# Patient Record
Sex: Female | Born: 1950 | Race: White | Hispanic: No | Marital: Married | Smoking: Never smoker
Health system: Southern US, Community
[De-identification: ages and names within clinical notes are randomized; demographics above are authoritative.]

## PROBLEM LIST (undated history)

## (undated) ENCOUNTER — Encounter

## (undated) ENCOUNTER — Encounter: Attending: Psychiatric/Mental Health | Primary: Psychiatric/Mental Health

## (undated) ENCOUNTER — Encounter: Attending: Diagnostic Radiology | Primary: Diagnostic Radiology

## (undated) ENCOUNTER — Encounter: Attending: Rheumatology | Primary: Rheumatology

## (undated) ENCOUNTER — Telehealth: Attending: Psychiatric/Mental Health | Primary: Psychiatric/Mental Health

## (undated) ENCOUNTER — Encounter: Attending: Neurology | Primary: Neurology

## (undated) ENCOUNTER — Ambulatory Visit: Payer: MEDICARE | Attending: Family Medicine | Primary: Family Medicine

## (undated) ENCOUNTER — Encounter: Attending: Internal Medicine | Primary: Internal Medicine

## (undated) ENCOUNTER — Telehealth

## (undated) ENCOUNTER — Encounter
Attending: Student in an Organized Health Care Education/Training Program | Primary: Student in an Organized Health Care Education/Training Program

## (undated) ENCOUNTER — Encounter: Attending: Ophthalmology | Primary: Ophthalmology

## (undated) ENCOUNTER — Ambulatory Visit: Payer: MEDICARE

## (undated) ENCOUNTER — Institutional Professional Consult (permissible substitution): Payer: MEDICARE

## (undated) ENCOUNTER — Ambulatory Visit: Payer: MEDICARE | Attending: Ophthalmology | Primary: Ophthalmology

## (undated) ENCOUNTER — Encounter: Attending: Family Medicine | Primary: Family Medicine

## (undated) ENCOUNTER — Ambulatory Visit

## (undated) ENCOUNTER — Encounter: Payer: Medicare (Managed Care) | Attending: Internal Medicine | Primary: Internal Medicine

## (undated) ENCOUNTER — Encounter: Attending: Hematology & Oncology | Primary: Hematology & Oncology

## (undated) ENCOUNTER — Telehealth: Attending: Neurology | Primary: Neurology

## (undated) ENCOUNTER — Ambulatory Visit: Payer: MEDICARE | Attending: Neurology | Primary: Neurology

## (undated) ENCOUNTER — Ambulatory Visit
Attending: Student in an Organized Health Care Education/Training Program | Primary: Student in an Organized Health Care Education/Training Program

## (undated) ENCOUNTER — Encounter: Attending: Cardiovascular Disease | Primary: Cardiovascular Disease

## (undated) ENCOUNTER — Telehealth: Attending: Internal Medicine | Primary: Internal Medicine

## (undated) ENCOUNTER — Ambulatory Visit: Payer: MEDICARE | Attending: Anesthesiology | Primary: Anesthesiology

## (undated) ENCOUNTER — Ambulatory Visit: Payer: MEDICARE | Attending: Hematology & Oncology | Primary: Hematology & Oncology

## (undated) ENCOUNTER — Ambulatory Visit: Payer: MEDICARE | Attending: Cardiovascular Disease | Primary: Cardiovascular Disease

## (undated) ENCOUNTER — Ambulatory Visit: Payer: MEDICARE | Attending: Rheumatology | Primary: Rheumatology

## (undated) ENCOUNTER — Ambulatory Visit: Attending: Neurology | Primary: Neurology

## (undated) ENCOUNTER — Ambulatory Visit: Payer: MEDICARE | Attending: Psychiatric/Mental Health | Primary: Psychiatric/Mental Health

## (undated) ENCOUNTER — Telehealth: Attending: Hematology & Oncology | Primary: Hematology & Oncology

## (undated) ENCOUNTER — Ambulatory Visit: Payer: MEDICARE | Attending: Allergy & Immunology | Primary: Allergy & Immunology

## (undated) ENCOUNTER — Encounter: Attending: Child & Adolescent Psychiatry | Primary: Child & Adolescent Psychiatry

## (undated) ENCOUNTER — Ambulatory Visit: Payer: Medicare (Managed Care)

## (undated) ENCOUNTER — Ambulatory Visit: Payer: MEDICARE | Attending: Geriatric Medicine | Primary: Geriatric Medicine

## (undated) ENCOUNTER — Ambulatory Visit
Payer: Medicare (Managed Care) | Attending: Student in an Organized Health Care Education/Training Program | Primary: Student in an Organized Health Care Education/Training Program

## (undated) ENCOUNTER — Ambulatory Visit: Attending: Internal Medicine | Primary: Internal Medicine

## (undated) HISTORY — PX: NISSEN FUNDOPLICATION: SHX2091

## (undated) MED ORDER — TRAMADOL 50 MG TABLET: 50 mg | tablet | Freq: Four times a day (QID) | 0 refills | 4 days

## (undated) MED ORDER — HYDROCHLOROTHIAZIDE 25 MG TABLET: ORAL | 0 days

---
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
## 2016-09-17 MED ORDER — LEFLUNOMIDE 20 MG TABLET
Freq: Every day | ORAL | 0 days
Start: 2016-09-17 — End: ?

## 2016-10-02 ENCOUNTER — Ambulatory Visit
Admission: RE | Admit: 2016-10-02 | Discharge: 2016-10-02 | Disposition: A | Discharge status: Home / Self Care | Payer: MEDICARE

## 2016-10-02 DIAGNOSIS — J301 Allergic rhinitis due to pollen: Principal | ICD-10-CM

## 2016-10-02 DIAGNOSIS — M3219 Other organ or system involvement in systemic lupus erythematosus: Principal | ICD-10-CM

## 2016-10-23 MED ORDER — LEVOCETIRIZINE 5 MG TABLET
ORAL_TABLET | 5 refills | 0 days | Status: CP
Start: 2016-10-23 — End: 2016-12-05

## 2016-11-06 ENCOUNTER — Ambulatory Visit
Admission: RE | Admit: 2016-11-06 | Discharge: 2016-11-06 | Disposition: A | Discharge status: Home / Self Care | Payer: MEDICARE

## 2016-11-06 ENCOUNTER — Ambulatory Visit
Admission: RE | Admit: 2016-11-06 | Discharge: 2016-11-06 | Disposition: A | Discharge status: Home / Self Care | Payer: MEDICARE | Attending: Family Medicine | Admitting: Family Medicine

## 2016-11-06 DIAGNOSIS — E039 Hypothyroidism, unspecified: Secondary | ICD-10-CM

## 2016-11-06 DIAGNOSIS — Z91018 Allergy to other foods: Secondary | ICD-10-CM

## 2016-11-06 DIAGNOSIS — C8310 Mantle cell lymphoma, unspecified site: Secondary | ICD-10-CM

## 2016-11-06 DIAGNOSIS — I10 Essential (primary) hypertension: Secondary | ICD-10-CM

## 2016-11-06 DIAGNOSIS — M329 Systemic lupus erythematosus, unspecified: Secondary | ICD-10-CM

## 2016-11-06 DIAGNOSIS — Z79899 Other long term (current) drug therapy: Secondary | ICD-10-CM

## 2016-11-06 DIAGNOSIS — F3342 Major depressive disorder, recurrent, in full remission: Secondary | ICD-10-CM

## 2016-11-06 DIAGNOSIS — M545 Low back pain: Secondary | ICD-10-CM

## 2016-11-06 DIAGNOSIS — E538 Deficiency of other specified B group vitamins: Secondary | ICD-10-CM

## 2016-11-06 DIAGNOSIS — E559 Vitamin D deficiency, unspecified: Secondary | ICD-10-CM

## 2016-11-06 DIAGNOSIS — M3219 Other organ or system involvement in systemic lupus erythematosus: Principal | ICD-10-CM

## 2016-11-06 DIAGNOSIS — J301 Allergic rhinitis due to pollen: Principal | ICD-10-CM

## 2016-11-06 DIAGNOSIS — H579 Unspecified disorder of eye and adnexa: Secondary | ICD-10-CM

## 2016-11-06 DIAGNOSIS — F419 Anxiety disorder, unspecified: Secondary | ICD-10-CM

## 2016-11-06 DIAGNOSIS — R7612 Nonspecific reaction to cell mediated immunity measurement of gamma interferon antigen response without active tuberculosis: Secondary | ICD-10-CM

## 2016-11-06 DIAGNOSIS — G8929 Other chronic pain: Secondary | ICD-10-CM

## 2016-11-06 MED ORDER — LISINOPRIL 5 MG TABLET
ORAL_TABLET | Freq: Every day | ORAL | 11 refills | 0.00000 days | Status: CP
Start: 2016-11-06 — End: 2016-11-18

## 2016-11-06 MED ORDER — EPINEPHRINE 0.3 MG/0.3 ML INJECTION, AUTO-INJECTOR
INTRAMUSCULAR | 3 refills | 0.00000 days | Status: CP
Start: 2016-11-06 — End: ?

## 2016-11-18 MED ORDER — LISINOPRIL 5 MG TABLET
ORAL_TABLET | Freq: Every day | ORAL | 0 refills | 0 days
Start: 2016-11-18 — End: 2017-03-27

## 2016-11-21 MED ORDER — TIZANIDINE 4 MG TABLET
ORAL_TABLET | Freq: Three times a day (TID) | ORAL | 0 refills | 0.00000 days | Status: CP | PRN
Start: 2016-11-21 — End: 2016-12-18

## 2016-11-28 MED ORDER — TRAMADOL 50 MG TABLET
ORAL_TABLET | Freq: Two times a day (BID) | ORAL | 0 refills | 0.00000 days | Status: CP | PRN
Start: 2016-11-28 — End: 2017-02-04

## 2016-12-05 ENCOUNTER — Ambulatory Visit
Admission: RE | Admit: 2016-12-05 | Discharge: 2016-12-05 | Disposition: A | Discharge status: Home / Self Care | Attending: Internal Medicine

## 2016-12-05 ENCOUNTER — Ambulatory Visit
Admission: RE | Admit: 2016-12-05 | Discharge: 2016-12-05 | Disposition: A | Discharge status: Home / Self Care | Payer: MEDICARE

## 2016-12-05 DIAGNOSIS — J301 Allergic rhinitis due to pollen: Principal | ICD-10-CM

## 2016-12-05 DIAGNOSIS — M3219 Other organ or system involvement in systemic lupus erythematosus: Principal | ICD-10-CM

## 2016-12-05 DIAGNOSIS — M797 Fibromyalgia: Secondary | ICD-10-CM

## 2016-12-05 MED ORDER — GABAPENTIN 300 MG CAPSULE
ORAL_CAPSULE | Freq: Every evening | ORAL | 3 refills | 0 days | Status: CP
Start: 2016-12-05 — End: 2017-06-25

## 2016-12-05 MED ORDER — NAPROXEN 500 MG TABLET
ORAL_TABLET | Freq: Two times a day (BID) | ORAL | 11 refills | 0.00000 days | Status: CP
Start: 2016-12-05 — End: 2017-05-29

## 2016-12-08 MED ORDER — LEVOCETIRIZINE 5 MG TABLET
ORAL_TABLET | prn refills | 0 days | Status: CP
Start: 2016-12-08 — End: 2017-04-10

## 2016-12-08 MED ORDER — FLUTICASONE PROPIONATE 50 MCG/ACTUATION NASAL SPRAY,SUSPENSION
Freq: Two times a day (BID) | NASAL | prn refills | 0.00000 days | Status: CP
Start: 2016-12-08 — End: 2017-06-06

## 2016-12-13 ENCOUNTER — Ambulatory Visit
Admission: RE | Admit: 2016-12-13 | Discharge: 2016-12-13 | Disposition: A | Discharge status: Home / Self Care | Payer: MEDICARE | Attending: Hematology & Oncology | Admitting: Hematology & Oncology

## 2016-12-13 DIAGNOSIS — C8310 Mantle cell lymphoma, unspecified site: Principal | ICD-10-CM

## 2016-12-16 ENCOUNTER — Ambulatory Visit
Admission: RE | Admit: 2016-12-16 | Discharge: 2016-12-16 | Disposition: A | Discharge status: Home / Self Care | Payer: MEDICARE

## 2016-12-16 ENCOUNTER — Ambulatory Visit
Admission: RE | Admit: 2016-12-16 | Discharge: 2016-12-16 | Disposition: A | Discharge status: Home / Self Care | Payer: MEDICARE | Attending: Physical Medicine & Rehabilitation | Admitting: Physical Medicine & Rehabilitation

## 2016-12-16 DIAGNOSIS — M47816 Spondylosis without myelopathy or radiculopathy, lumbar region: Secondary | ICD-10-CM

## 2016-12-16 DIAGNOSIS — M549 Dorsalgia, unspecified: Principal | ICD-10-CM

## 2016-12-17 ENCOUNTER — Ambulatory Visit: Admission: RE | Admit: 2016-12-17 | Discharge: 2016-12-17 | Admitting: Ophthalmology

## 2016-12-17 DIAGNOSIS — T378X5A Adverse effect of other specified systemic anti-infectives and antiparasitics, initial encounter: Secondary | ICD-10-CM

## 2016-12-17 DIAGNOSIS — M329 Systemic lupus erythematosus, unspecified: Principal | ICD-10-CM

## 2016-12-18 MED ORDER — TIZANIDINE 4 MG TABLET
ORAL_TABLET | Freq: Two times a day (BID) | ORAL | 1 refills | 0.00000 days | Status: CP | PRN
Start: 2016-12-18 — End: 2017-02-17

## 2016-12-23 MED ORDER — CLONAZEPAM 0.5 MG TABLET
ORAL_TABLET | 0 refills | 0 days | Status: CP
Start: 2016-12-23 — End: 2016-12-25

## 2016-12-25 MED ORDER — CLONAZEPAM 0.5 MG TABLET
ORAL_TABLET | 0 refills | 0 days | Status: CP
Start: 2016-12-25 — End: 2017-01-06

## 2017-01-01 ENCOUNTER — Ambulatory Visit
Admission: RE | Admit: 2017-01-01 | Discharge: 2017-01-01 | Disposition: A | Discharge status: Home / Self Care | Payer: MEDICARE

## 2017-01-01 ENCOUNTER — Ambulatory Visit
Admission: RE | Admit: 2017-01-01 | Discharge: 2017-01-01 | Disposition: A | Discharge status: Home / Self Care | Payer: MEDICARE | Attending: Family Medicine | Admitting: Family Medicine

## 2017-01-01 DIAGNOSIS — L989 Disorder of the skin and subcutaneous tissue, unspecified: Secondary | ICD-10-CM

## 2017-01-01 DIAGNOSIS — M79622 Pain in left upper arm: Secondary | ICD-10-CM

## 2017-01-01 DIAGNOSIS — R7612 Nonspecific reaction to cell mediated immunity measurement of gamma interferon antigen response without active tuberculosis: Secondary | ICD-10-CM

## 2017-01-01 DIAGNOSIS — Z23 Encounter for immunization: Secondary | ICD-10-CM

## 2017-01-01 DIAGNOSIS — M3219 Other organ or system involvement in systemic lupus erythematosus: Principal | ICD-10-CM

## 2017-01-01 DIAGNOSIS — J301 Allergic rhinitis due to pollen: Principal | ICD-10-CM

## 2017-01-01 DIAGNOSIS — F419 Anxiety disorder, unspecified: Principal | ICD-10-CM

## 2017-01-01 DIAGNOSIS — Z79899 Other long term (current) drug therapy: Secondary | ICD-10-CM

## 2017-01-01 DIAGNOSIS — G44209 Tension-type headache, unspecified, not intractable: Secondary | ICD-10-CM

## 2017-01-01 DIAGNOSIS — I10 Essential (primary) hypertension: Secondary | ICD-10-CM

## 2017-01-01 DIAGNOSIS — M79602 Pain in left arm: Secondary | ICD-10-CM

## 2017-01-01 DIAGNOSIS — J349 Unspecified disorder of nose and nasal sinuses: Secondary | ICD-10-CM

## 2017-01-06 MED ORDER — CLONAZEPAM 1 MG TABLET
ORAL_TABLET | Freq: Every evening | ORAL | 0 refills | 0 days | Status: CP
Start: 2017-01-06 — End: 2017-04-30

## 2017-01-08 ENCOUNTER — Ambulatory Visit
Admission: RE | Admit: 2017-01-08 | Discharge: 2017-01-08 | Payer: MEDICARE | Attending: Ophthalmology | Admitting: Ophthalmology

## 2017-01-08 DIAGNOSIS — T378X5A Adverse effect of other specified systemic anti-infectives and antiparasitics, initial encounter: Secondary | ICD-10-CM

## 2017-01-08 DIAGNOSIS — M329 Systemic lupus erythematosus, unspecified: Principal | ICD-10-CM

## 2017-01-08 DIAGNOSIS — H04123 Dry eye syndrome of bilateral lacrimal glands: Secondary | ICD-10-CM

## 2017-01-08 MED ORDER — PREDNISOLONE SODIUM PHOSPHATE 1 % EYE DROPS
0 refills | 0 days | Status: CP
Start: 2017-01-08 — End: 2017-03-31

## 2017-01-21 MED ORDER — LEVOFLOXACIN 750 MG TABLET
ORAL_TABLET | Freq: Every day | ORAL | 0 refills | 0 days | Status: CP
Start: 2017-01-21 — End: 2017-01-31

## 2017-01-29 ENCOUNTER — Ambulatory Visit
Admission: RE | Admit: 2017-01-29 | Discharge: 2017-01-29 | Disposition: A | Discharge status: Home / Self Care | Payer: MEDICARE

## 2017-01-29 DIAGNOSIS — J3089 Other allergic rhinitis: Principal | ICD-10-CM

## 2017-01-29 DIAGNOSIS — M3219 Other organ or system involvement in systemic lupus erythematosus: Principal | ICD-10-CM

## 2017-02-04 MED ORDER — TRAMADOL 50 MG TABLET
ORAL_TABLET | Freq: Two times a day (BID) | ORAL | 0 refills | 0 days | Status: CP | PRN
Start: 2017-02-04 — End: 2017-04-01

## 2017-02-10 ENCOUNTER — Ambulatory Visit
Admission: RE | Admit: 2017-02-10 | Discharge: 2017-02-10 | Payer: MEDICARE | Attending: Ophthalmology | Admitting: Ophthalmology

## 2017-02-10 DIAGNOSIS — H2513 Age-related nuclear cataract, bilateral: Secondary | ICD-10-CM

## 2017-02-10 DIAGNOSIS — H04123 Dry eye syndrome of bilateral lacrimal glands: Principal | ICD-10-CM

## 2017-02-18 MED ORDER — TIZANIDINE 4 MG TABLET
ORAL_TABLET | Freq: Every evening | ORAL | 0 refills | 0.00000 days | Status: CP | PRN
Start: 2017-02-18 — End: 2017-03-24

## 2017-02-26 ENCOUNTER — Ambulatory Visit: Admission: RE | Admit: 2017-02-26 | Discharge: 2017-02-26 | Payer: MEDICARE

## 2017-02-26 ENCOUNTER — Ambulatory Visit
Admission: RE | Admit: 2017-02-26 | Discharge: 2017-02-26 | Disposition: A | Discharge status: Home / Self Care | Payer: MEDICARE

## 2017-02-26 DIAGNOSIS — J3089 Other allergic rhinitis: Principal | ICD-10-CM

## 2017-02-26 DIAGNOSIS — M3219 Other organ or system involvement in systemic lupus erythematosus: Principal | ICD-10-CM

## 2017-03-24 MED ORDER — TIZANIDINE 2 MG TABLET
ORAL_TABLET | Freq: Every evening | ORAL | 0 refills | 0.00000 days | Status: CP | PRN
Start: 2017-03-24 — End: 2017-04-29

## 2017-03-27 ENCOUNTER — Ambulatory Visit: Admit: 2017-03-27 | Discharge: 2017-03-27 | Payer: MEDICARE | Attending: Family Medicine | Primary: Family Medicine

## 2017-03-27 ENCOUNTER — Ambulatory Visit: Admit: 2017-03-27 | Discharge: 2017-03-27 | Payer: MEDICARE

## 2017-03-27 ENCOUNTER — Institutional Professional Consult (permissible substitution): Admit: 2017-03-27 | Discharge: 2017-03-27 | Payer: MEDICARE

## 2017-03-27 DIAGNOSIS — M3219 Other organ or system involvement in systemic lupus erythematosus: Principal | ICD-10-CM

## 2017-03-27 DIAGNOSIS — C8580 Other specified types of non-Hodgkin lymphoma, unspecified site: Secondary | ICD-10-CM

## 2017-03-27 DIAGNOSIS — I10 Essential (primary) hypertension: Secondary | ICD-10-CM

## 2017-03-27 DIAGNOSIS — J3089 Other allergic rhinitis: Principal | ICD-10-CM

## 2017-03-27 DIAGNOSIS — E039 Hypothyroidism, unspecified: Secondary | ICD-10-CM

## 2017-03-27 DIAGNOSIS — E559 Vitamin D deficiency, unspecified: Secondary | ICD-10-CM

## 2017-03-27 DIAGNOSIS — G8929 Other chronic pain: Secondary | ICD-10-CM

## 2017-03-27 DIAGNOSIS — Z79899 Other long term (current) drug therapy: Secondary | ICD-10-CM

## 2017-03-27 DIAGNOSIS — Z8659 Personal history of other mental and behavioral disorders: Secondary | ICD-10-CM

## 2017-03-27 DIAGNOSIS — F419 Anxiety disorder, unspecified: Secondary | ICD-10-CM

## 2017-03-27 DIAGNOSIS — F329 Major depressive disorder, single episode, unspecified: Secondary | ICD-10-CM

## 2017-03-27 DIAGNOSIS — R51 Headache: Secondary | ICD-10-CM

## 2017-03-31 MED ORDER — PREDNISOLONE SODIUM PHOSPHATE 1 % EYE DROPS
Freq: Every day | OPHTHALMIC | 0 refills | 0 days | Status: CP
Start: 2017-03-31 — End: 2017-08-05

## 2017-04-01 MED ORDER — TRAZODONE 50 MG TABLET
ORAL_TABLET | Freq: Every evening | ORAL | 0 refills | 0 days | Status: CP
Start: 2017-04-01 — End: 2017-04-30

## 2017-04-01 MED ORDER — TRAMADOL 50 MG TABLET
ORAL_TABLET | Freq: Every day | ORAL | 0 refills | 0 days | Status: CP | PRN
Start: 2017-04-01 — End: 2017-05-28

## 2017-04-07 ENCOUNTER — Ambulatory Visit: Admit: 2017-04-07 | Discharge: 2017-04-08 | Payer: MEDICARE | Attending: Ophthalmology | Primary: Ophthalmology

## 2017-04-07 DIAGNOSIS — H2513 Age-related nuclear cataract, bilateral: Secondary | ICD-10-CM

## 2017-04-07 DIAGNOSIS — H04123 Dry eye syndrome of bilateral lacrimal glands: Principal | ICD-10-CM

## 2017-04-07 DIAGNOSIS — M329 Systemic lupus erythematosus, unspecified: Secondary | ICD-10-CM

## 2017-04-10 MED ORDER — LEVOCETIRIZINE 5 MG TABLET
ORAL_TABLET | 6 refills | 0 days | Status: CP
Start: 2017-04-10 — End: 2018-01-22

## 2017-04-24 ENCOUNTER — Ambulatory Visit: Admit: 2017-04-24 | Discharge: 2017-04-25 | Payer: MEDICARE

## 2017-04-24 ENCOUNTER — Institutional Professional Consult (permissible substitution): Admit: 2017-04-24 | Discharge: 2017-04-25 | Payer: MEDICARE

## 2017-04-24 DIAGNOSIS — M3219 Other organ or system involvement in systemic lupus erythematosus: Principal | ICD-10-CM

## 2017-04-24 DIAGNOSIS — J3089 Other allergic rhinitis: Principal | ICD-10-CM

## 2017-04-28 MED ORDER — LEVOTHYROXINE 100 MCG TABLET
ORAL_TABLET | 1 refills | 0 days | Status: CP
Start: 2017-04-28 — End: 2017-05-29

## 2017-04-29 ENCOUNTER — Ambulatory Visit: Admit: 2017-04-29 | Discharge: 2017-04-30 | Payer: MEDICARE | Attending: Neurology | Primary: Neurology

## 2017-04-29 DIAGNOSIS — R51 Headache: Secondary | ICD-10-CM

## 2017-04-29 DIAGNOSIS — G43709 Chronic migraine without aura, not intractable, without status migrainosus: Principal | ICD-10-CM

## 2017-04-29 DIAGNOSIS — G4440 Drug-induced headache, not elsewhere classified, not intractable: Secondary | ICD-10-CM

## 2017-04-29 DIAGNOSIS — N6082 Other benign mammary dysplasias of left breast: Secondary | ICD-10-CM

## 2017-04-29 DIAGNOSIS — Z1231 Encounter for screening mammogram for malignant neoplasm of breast: Secondary | ICD-10-CM

## 2017-04-29 MED ORDER — RIZATRIPTAN 10 MG TABLET
ORAL_TABLET | Freq: Once | ORAL | 2 refills | 0.00000 days | Status: CP | PRN
Start: 2017-04-29 — End: 2017-04-29

## 2017-04-29 MED ORDER — RIZATRIPTAN 10 MG TABLET: 10 mg | tablet | Freq: Once | 2 refills | 0 days | Status: AC

## 2017-04-30 ENCOUNTER — Ambulatory Visit
Admit: 2017-04-30 | Discharge: 2017-05-01 | Payer: MEDICARE | Attending: Psychiatric/Mental Health | Primary: Psychiatric/Mental Health

## 2017-04-30 DIAGNOSIS — F419 Anxiety disorder, unspecified: Secondary | ICD-10-CM

## 2017-04-30 DIAGNOSIS — Z8659 Personal history of other mental and behavioral disorders: Secondary | ICD-10-CM

## 2017-04-30 DIAGNOSIS — Z79899 Other long term (current) drug therapy: Secondary | ICD-10-CM

## 2017-04-30 DIAGNOSIS — G8929 Other chronic pain: Secondary | ICD-10-CM

## 2017-04-30 DIAGNOSIS — F329 Major depressive disorder, single episode, unspecified: Principal | ICD-10-CM

## 2017-04-30 MED ORDER — TIZANIDINE 2 MG TABLET
ORAL_TABLET | Freq: Every evening | ORAL | 0 refills | 0.00000 days | Status: CP | PRN
Start: 2017-04-30 — End: 2017-07-14

## 2017-04-30 MED ORDER — CLONAZEPAM 1 MG TABLET
ORAL_TABLET | 1 refills | 0 days | Status: CP
Start: 2017-04-30 — End: 2017-06-25

## 2017-04-30 MED ORDER — DULOXETINE 60 MG CAPSULE,DELAYED RELEASE
ORAL_CAPSULE | Freq: Every day | ORAL | 0 refills | 0.00000 days | Status: CP
Start: 2017-04-30 — End: 2017-06-25

## 2017-04-30 MED ORDER — TRAZODONE 50 MG TABLET
ORAL_TABLET | Freq: Every evening | ORAL | 0 refills | 0.00000 days | Status: CP
Start: 2017-04-30 — End: 2017-06-25

## 2017-05-07 ENCOUNTER — Ambulatory Visit: Admit: 2017-05-07 | Discharge: 2017-05-07 | Payer: MEDICARE

## 2017-05-07 DIAGNOSIS — G43709 Chronic migraine without aura, not intractable, without status migrainosus: Principal | ICD-10-CM

## 2017-05-07 DIAGNOSIS — G4440 Drug-induced headache, not elsewhere classified, not intractable: Secondary | ICD-10-CM

## 2017-05-07 DIAGNOSIS — R51 Headache: Secondary | ICD-10-CM

## 2017-05-20 ENCOUNTER — Ambulatory Visit: Admit: 2017-05-20 | Discharge: 2017-05-21 | Payer: MEDICARE

## 2017-05-20 DIAGNOSIS — M3219 Other organ or system involvement in systemic lupus erythematosus: Principal | ICD-10-CM

## 2017-05-20 DIAGNOSIS — C8580 Other specified types of non-Hodgkin lymphoma, unspecified site: Secondary | ICD-10-CM

## 2017-05-20 DIAGNOSIS — G43709 Chronic migraine without aura, not intractable, without status migrainosus: Secondary | ICD-10-CM

## 2017-05-20 DIAGNOSIS — G4440 Drug-induced headache, not elsewhere classified, not intractable: Secondary | ICD-10-CM

## 2017-05-20 DIAGNOSIS — R51 Headache: Secondary | ICD-10-CM

## 2017-05-27 ENCOUNTER — Ambulatory Visit: Admit: 2017-05-27 | Discharge: 2017-05-27 | Payer: MEDICARE | Attending: Neurology | Primary: Neurology

## 2017-05-27 ENCOUNTER — Institutional Professional Consult (permissible substitution): Admit: 2017-05-27 | Discharge: 2017-05-27 | Payer: MEDICARE

## 2017-05-27 DIAGNOSIS — G43709 Chronic migraine without aura, not intractable, without status migrainosus: Principal | ICD-10-CM

## 2017-05-27 DIAGNOSIS — J3089 Other allergic rhinitis: Principal | ICD-10-CM

## 2017-05-27 MED ORDER — SUMATRIPTAN 100 MG TABLET
ORAL_TABLET | Freq: Once | ORAL | 2 refills | 0.00000 days | Status: CP | PRN
Start: 2017-05-27 — End: 2017-05-31

## 2017-05-28 ENCOUNTER — Ambulatory Visit: Admit: 2017-05-28 | Discharge: 2017-05-28 | Payer: MEDICARE

## 2017-05-28 DIAGNOSIS — Z1231 Encounter for screening mammogram for malignant neoplasm of breast: Principal | ICD-10-CM

## 2017-05-28 DIAGNOSIS — N6082 Other benign mammary dysplasias of left breast: Secondary | ICD-10-CM

## 2017-05-28 MED ORDER — TRAMADOL 50 MG TABLET
ORAL_TABLET | Freq: Every day | ORAL | 0 refills | 0.00000 days | Status: CP | PRN
Start: 2017-05-28 — End: 2017-07-22

## 2017-05-29 ENCOUNTER — Ambulatory Visit: Admit: 2017-05-29 | Discharge: 2017-05-30 | Payer: MEDICARE | Attending: Family Medicine | Primary: Family Medicine

## 2017-05-29 ENCOUNTER — Ambulatory Visit: Admit: 2017-05-29 | Discharge: 2017-05-30 | Payer: MEDICARE

## 2017-05-29 ENCOUNTER — Ambulatory Visit
Admit: 2017-05-29 | Discharge: 2017-05-30 | Payer: MEDICARE | Attending: Physical Medicine & Rehabilitation | Primary: Physical Medicine & Rehabilitation

## 2017-05-29 DIAGNOSIS — E039 Hypothyroidism, unspecified: Secondary | ICD-10-CM

## 2017-05-29 DIAGNOSIS — M79602 Pain in left arm: Secondary | ICD-10-CM

## 2017-05-29 DIAGNOSIS — Z79899 Other long term (current) drug therapy: Secondary | ICD-10-CM

## 2017-05-29 DIAGNOSIS — N644 Mastodynia: Secondary | ICD-10-CM

## 2017-05-29 DIAGNOSIS — Z9289 Personal history of other medical treatment: Principal | ICD-10-CM

## 2017-05-29 DIAGNOSIS — M3219 Other organ or system involvement in systemic lupus erythematosus: Principal | ICD-10-CM

## 2017-05-29 DIAGNOSIS — I10 Essential (primary) hypertension: Secondary | ICD-10-CM

## 2017-05-29 DIAGNOSIS — M797 Fibromyalgia: Secondary | ICD-10-CM

## 2017-05-29 DIAGNOSIS — M47816 Spondylosis without myelopathy or radiculopathy, lumbar region: Principal | ICD-10-CM

## 2017-05-29 MED ORDER — NAPROXEN 500 MG TABLET
ORAL_TABLET | Freq: Every day | ORAL | 11 refills | 0 days
Start: 2017-05-29 — End: 2017-08-21

## 2017-05-29 MED ORDER — HYDROCHLOROTHIAZIDE 25 MG TABLET
ORAL_TABLET | Freq: Every day | ORAL | 3 refills | 0 days | Status: CP
Start: 2017-05-29 — End: 2017-12-10

## 2017-05-29 MED ORDER — LEVOTHYROXINE 50 MCG TABLET
ORAL_TABLET | Freq: Every day | ORAL | 1 refills | 0 days | Status: CP
Start: 2017-05-29 — End: 2018-01-21

## 2017-05-30 ENCOUNTER — Ambulatory Visit: Admit: 2017-05-30 | Discharge: 2017-05-31 | Payer: MEDICARE | Attending: Neurology | Primary: Neurology

## 2017-05-30 DIAGNOSIS — G43709 Chronic migraine without aura, not intractable, without status migrainosus: Principal | ICD-10-CM

## 2017-06-04 ENCOUNTER — Ambulatory Visit: Admit: 2017-06-04 | Discharge: 2017-06-05 | Payer: MEDICARE

## 2017-06-04 DIAGNOSIS — L820 Inflamed seborrheic keratosis: Secondary | ICD-10-CM

## 2017-06-04 DIAGNOSIS — D229 Melanocytic nevi, unspecified: Secondary | ICD-10-CM

## 2017-06-04 DIAGNOSIS — L821 Other seborrheic keratosis: Secondary | ICD-10-CM

## 2017-06-04 DIAGNOSIS — I781 Nevus, non-neoplastic: Secondary | ICD-10-CM

## 2017-06-04 DIAGNOSIS — L570 Actinic keratosis: Principal | ICD-10-CM

## 2017-06-04 DIAGNOSIS — L851 Acquired keratosis [keratoderma] palmaris et plantaris: Secondary | ICD-10-CM

## 2017-06-04 MED ORDER — AMMONIUM LACTATE 12 % TOPICAL CREAM
Freq: Every day | TOPICAL | 6 refills | 0 days | Status: CP
Start: 2017-06-04 — End: ?

## 2017-06-06 MED ORDER — FLUTICASONE PROPIONATE 50 MCG/ACTUATION NASAL SPRAY,SUSPENSION
Freq: Two times a day (BID) | NASAL | prn refills | 0.00000 days | Status: CP
Start: 2017-06-06 — End: 2018-04-24

## 2017-06-08 MED ORDER — LEVOFLOXACIN 750 MG TABLET
ORAL_TABLET | Freq: Every day | ORAL | 0 refills | 0.00000 days | Status: CP
Start: 2017-06-08 — End: 2017-06-18

## 2017-06-11 ENCOUNTER — Ambulatory Visit: Admit: 2017-06-11 | Discharge: 2017-06-12 | Payer: MEDICARE

## 2017-06-11 DIAGNOSIS — H04123 Dry eye syndrome of bilateral lacrimal glands: Secondary | ICD-10-CM

## 2017-06-11 DIAGNOSIS — T451X4D Poisoning by antineoplastic and immunosuppressive drugs, undetermined, subsequent encounter: Principal | ICD-10-CM

## 2017-06-11 DIAGNOSIS — H2513 Age-related nuclear cataract, bilateral: Secondary | ICD-10-CM

## 2017-06-11 DIAGNOSIS — T378X5A Adverse effect of other specified systemic anti-infectives and antiparasitics, initial encounter: Secondary | ICD-10-CM

## 2017-06-11 DIAGNOSIS — H53423 Scotoma of blind spot area, bilateral: Secondary | ICD-10-CM

## 2017-06-17 ENCOUNTER — Ambulatory Visit: Admit: 2017-06-17 | Discharge: 2017-06-17 | Payer: MEDICARE

## 2017-06-17 ENCOUNTER — Institutional Professional Consult (permissible substitution): Admit: 2017-06-17 | Discharge: 2017-06-17 | Payer: MEDICARE

## 2017-06-17 DIAGNOSIS — M3219 Other organ or system involvement in systemic lupus erythematosus: Principal | ICD-10-CM

## 2017-06-17 DIAGNOSIS — J3089 Other allergic rhinitis: Principal | ICD-10-CM

## 2017-06-23 MED ORDER — OMEPRAZOLE 40 MG CAPSULE,DELAYED RELEASE
ORAL_CAPSULE | 1 refills | 0 days | Status: CP
Start: 2017-06-23 — End: 2017-11-30

## 2017-06-24 ENCOUNTER — Ambulatory Visit: Admit: 2017-06-24 | Discharge: 2017-06-25 | Payer: MEDICARE | Attending: Neurology | Primary: Neurology

## 2017-06-24 DIAGNOSIS — H02403 Unspecified ptosis of bilateral eyelids: Principal | ICD-10-CM

## 2017-06-24 DIAGNOSIS — F3342 Major depressive disorder, recurrent, in full remission: Secondary | ICD-10-CM

## 2017-06-24 DIAGNOSIS — G43709 Chronic migraine without aura, not intractable, without status migrainosus: Secondary | ICD-10-CM

## 2017-06-25 ENCOUNTER — Ambulatory Visit: Admit: 2017-06-25 | Discharge: 2017-06-26 | Payer: MEDICARE

## 2017-06-25 ENCOUNTER — Ambulatory Visit
Admit: 2017-06-25 | Discharge: 2017-06-26 | Payer: MEDICARE | Attending: Psychiatric/Mental Health | Primary: Psychiatric/Mental Health

## 2017-06-25 DIAGNOSIS — Z79899 Other long term (current) drug therapy: Secondary | ICD-10-CM

## 2017-06-25 DIAGNOSIS — F3341 Major depressive disorder, recurrent, in partial remission: Secondary | ICD-10-CM

## 2017-06-25 DIAGNOSIS — F4323 Adjustment disorder with mixed anxiety and depressed mood: Principal | ICD-10-CM

## 2017-06-25 DIAGNOSIS — R21 Rash and other nonspecific skin eruption: Secondary | ICD-10-CM

## 2017-06-25 DIAGNOSIS — Z8659 Personal history of other mental and behavioral disorders: Secondary | ICD-10-CM

## 2017-06-25 DIAGNOSIS — G43709 Chronic migraine without aura, not intractable, without status migrainosus: Secondary | ICD-10-CM

## 2017-06-25 DIAGNOSIS — F3342 Major depressive disorder, recurrent, in full remission: Secondary | ICD-10-CM

## 2017-06-25 DIAGNOSIS — F411 Generalized anxiety disorder: Secondary | ICD-10-CM

## 2017-06-25 DIAGNOSIS — M797 Fibromyalgia: Principal | ICD-10-CM

## 2017-06-25 MED ORDER — GABAPENTIN 300 MG CAPSULE
ORAL_CAPSULE | Freq: Two times a day (BID) | ORAL | 3 refills | 0 days | Status: CP
Start: 2017-06-25 — End: 2017-08-21

## 2017-06-25 MED ORDER — DULOXETINE 60 MG CAPSULE,DELAYED RELEASE
ORAL_CAPSULE | Freq: Every day | ORAL | 0 refills | 0 days | Status: CP
Start: 2017-06-25 — End: 2017-09-03

## 2017-06-25 MED ORDER — TRAZODONE 50 MG TABLET
ORAL_TABLET | Freq: Every evening | ORAL | 1 refills | 0.00000 days | Status: CP
Start: 2017-06-25 — End: 2017-12-10

## 2017-06-25 MED ORDER — CLONAZEPAM 1 MG TABLET
ORAL_TABLET | 1 refills | 0 days | Status: CP
Start: 2017-06-25 — End: 2017-08-22

## 2017-06-28 MED ORDER — LIDOCAINE 5 % TOPICAL PATCH
MEDICATED_PATCH | Freq: Every day | TRANSDERMAL | 5 refills | 0.00000 days | Status: CP | PRN
Start: 2017-06-28 — End: 2018-06-28

## 2017-07-10 ENCOUNTER — Ambulatory Visit: Admit: 2017-07-10 | Discharge: 2017-07-11 | Payer: MEDICARE | Attending: Ophthalmology | Primary: Ophthalmology

## 2017-07-10 DIAGNOSIS — H04123 Dry eye syndrome of bilateral lacrimal glands: Principal | ICD-10-CM

## 2017-07-14 MED ORDER — TIZANIDINE 2 MG TABLET
ORAL_TABLET | Freq: Every evening | ORAL | 0 refills | 0.00000 days | Status: CP | PRN
Start: 2017-07-14 — End: 2017-08-21

## 2017-07-15 ENCOUNTER — Institutional Professional Consult (permissible substitution): Admit: 2017-07-15 | Discharge: 2017-07-16 | Payer: MEDICARE

## 2017-07-15 ENCOUNTER — Ambulatory Visit: Admit: 2017-07-15 | Discharge: 2017-07-16 | Payer: MEDICARE

## 2017-07-15 DIAGNOSIS — M3219 Other organ or system involvement in systemic lupus erythematosus: Principal | ICD-10-CM

## 2017-07-15 DIAGNOSIS — E039 Hypothyroidism, unspecified: Secondary | ICD-10-CM

## 2017-07-15 DIAGNOSIS — J3089 Other allergic rhinitis: Principal | ICD-10-CM

## 2017-07-15 DIAGNOSIS — I10 Essential (primary) hypertension: Secondary | ICD-10-CM

## 2017-07-22 MED ORDER — TRAMADOL 50 MG TABLET
ORAL_TABLET | Freq: Every day | ORAL | 0 refills | 0 days | Status: CP | PRN
Start: 2017-07-22 — End: 2017-08-21

## 2017-07-24 ENCOUNTER — Ambulatory Visit: Admit: 2017-07-24 | Discharge: 2017-07-25 | Payer: MEDICARE | Attending: Clinical | Primary: Clinical

## 2017-07-24 DIAGNOSIS — F4323 Adjustment disorder with mixed anxiety and depressed mood: Principal | ICD-10-CM

## 2017-07-30 ENCOUNTER — Ambulatory Visit: Admit: 2017-07-30 | Discharge: 2017-07-31 | Payer: MEDICARE | Attending: Ophthalmology | Primary: Ophthalmology

## 2017-07-30 DIAGNOSIS — H04123 Dry eye syndrome of bilateral lacrimal glands: Principal | ICD-10-CM

## 2017-08-04 ENCOUNTER — Ambulatory Visit: Admit: 2017-08-04 | Discharge: 2017-08-04 | Payer: MEDICARE

## 2017-08-04 DIAGNOSIS — R928 Other abnormal and inconclusive findings on diagnostic imaging of breast: Principal | ICD-10-CM

## 2017-08-05 ENCOUNTER — Ambulatory Visit: Admit: 2017-08-05 | Discharge: 2017-08-06 | Payer: MEDICARE | Attending: Ophthalmology | Primary: Ophthalmology

## 2017-08-05 DIAGNOSIS — H04123 Dry eye syndrome of bilateral lacrimal glands: Principal | ICD-10-CM

## 2017-08-05 DIAGNOSIS — H02403 Unspecified ptosis of bilateral eyelids: Secondary | ICD-10-CM

## 2017-08-12 ENCOUNTER — Ambulatory Visit: Admit: 2017-08-12 | Discharge: 2017-08-12 | Payer: MEDICARE

## 2017-08-12 ENCOUNTER — Institutional Professional Consult (permissible substitution): Admit: 2017-08-12 | Discharge: 2017-08-12 | Payer: MEDICARE

## 2017-08-12 ENCOUNTER — Ambulatory Visit: Admit: 2017-08-12 | Discharge: 2017-08-12 | Payer: MEDICARE | Attending: Clinical | Primary: Clinical

## 2017-08-12 DIAGNOSIS — M3219 Other organ or system involvement in systemic lupus erythematosus: Principal | ICD-10-CM

## 2017-08-12 DIAGNOSIS — J3089 Other allergic rhinitis: Principal | ICD-10-CM

## 2017-08-12 DIAGNOSIS — F4323 Adjustment disorder with mixed anxiety and depressed mood: Principal | ICD-10-CM

## 2017-08-21 ENCOUNTER — Ambulatory Visit: Admit: 2017-08-21 | Discharge: 2017-08-22 | Payer: MEDICARE | Attending: Family Medicine | Primary: Family Medicine

## 2017-08-21 DIAGNOSIS — R51 Headache: Secondary | ICD-10-CM

## 2017-08-21 DIAGNOSIS — G8929 Other chronic pain: Secondary | ICD-10-CM

## 2017-08-21 DIAGNOSIS — M3219 Other organ or system involvement in systemic lupus erythematosus: Principal | ICD-10-CM

## 2017-08-21 DIAGNOSIS — M797 Fibromyalgia: Secondary | ICD-10-CM

## 2017-08-21 DIAGNOSIS — E039 Hypothyroidism, unspecified: Secondary | ICD-10-CM

## 2017-08-21 DIAGNOSIS — M3501 Sicca syndrome with keratoconjunctivitis: Secondary | ICD-10-CM

## 2017-08-21 DIAGNOSIS — Z79899 Other long term (current) drug therapy: Secondary | ICD-10-CM

## 2017-08-21 DIAGNOSIS — I10 Essential (primary) hypertension: Secondary | ICD-10-CM

## 2017-08-21 MED ORDER — TRAMADOL 50 MG TABLET
ORAL_TABLET | Freq: Every day | ORAL | 1 refills | 0 days | Status: CP | PRN
Start: 2017-08-21 — End: 2017-12-10

## 2017-08-21 MED ORDER — GABAPENTIN 100 MG CAPSULE
ORAL_CAPSULE | 3 refills | 0 days | Status: CP
Start: 2017-08-21 — End: 2017-12-31

## 2017-08-21 MED ORDER — NAPROXEN 500 MG TABLET
ORAL_TABLET | Freq: Two times a day (BID) | ORAL | 0 refills | 0.00000 days
Start: 2017-08-21 — End: 2017-12-25

## 2017-08-21 MED ORDER — TIZANIDINE 2 MG TABLET
ORAL_TABLET | Freq: Every evening | ORAL | 1 refills | 0 days | Status: CP | PRN
Start: 2017-08-21 — End: 2018-01-21

## 2017-08-22 ENCOUNTER — Ambulatory Visit: Admit: 2017-08-22 | Discharge: 2017-08-23 | Payer: MEDICARE | Attending: Neurology | Primary: Neurology

## 2017-08-22 DIAGNOSIS — G43709 Chronic migraine without aura, not intractable, without status migrainosus: Principal | ICD-10-CM

## 2017-08-22 MED ORDER — CLONAZEPAM 1 MG TABLET
ORAL_TABLET | 1 refills | 0 days | Status: CP
Start: 2017-08-22 — End: 2017-09-03

## 2017-08-26 ENCOUNTER — Ambulatory Visit: Admit: 2017-08-26 | Discharge: 2017-08-27 | Payer: MEDICARE

## 2017-08-26 DIAGNOSIS — M3219 Other organ or system involvement in systemic lupus erythematosus: Principal | ICD-10-CM

## 2017-08-26 DIAGNOSIS — R8290 Unspecified abnormal findings in urine: Secondary | ICD-10-CM

## 2017-08-26 DIAGNOSIS — R21 Rash and other nonspecific skin eruption: Secondary | ICD-10-CM

## 2017-08-26 DIAGNOSIS — M797 Fibromyalgia: Secondary | ICD-10-CM

## 2017-09-03 ENCOUNTER — Ambulatory Visit
Admit: 2017-09-03 | Discharge: 2017-09-04 | Payer: MEDICARE | Attending: Psychiatric/Mental Health | Primary: Psychiatric/Mental Health

## 2017-09-03 DIAGNOSIS — G43109 Migraine with aura, not intractable, without status migrainosus: Secondary | ICD-10-CM

## 2017-09-03 DIAGNOSIS — M797 Fibromyalgia: Secondary | ICD-10-CM

## 2017-09-03 DIAGNOSIS — F4322 Adjustment disorder with anxiety: Principal | ICD-10-CM

## 2017-09-03 DIAGNOSIS — Z79899 Other long term (current) drug therapy: Secondary | ICD-10-CM

## 2017-09-03 DIAGNOSIS — Z8659 Personal history of other mental and behavioral disorders: Secondary | ICD-10-CM

## 2017-09-03 MED ORDER — DULOXETINE 60 MG CAPSULE,DELAYED RELEASE
ORAL_CAPSULE | Freq: Every day | ORAL | 0 refills | 0.00000 days | Status: CP
Start: 2017-09-03 — End: 2017-12-10

## 2017-09-03 MED ORDER — CLONAZEPAM 1 MG TABLET
ORAL_TABLET | 1 refills | 0 days | Status: CP
Start: 2017-09-03 — End: 2017-11-25

## 2017-09-11 ENCOUNTER — Institutional Professional Consult (permissible substitution): Admit: 2017-09-11 | Discharge: 2017-09-11 | Payer: MEDICARE

## 2017-09-11 ENCOUNTER — Ambulatory Visit: Admit: 2017-09-11 | Discharge: 2017-09-11 | Payer: MEDICARE

## 2017-09-11 DIAGNOSIS — J3089 Other allergic rhinitis: Principal | ICD-10-CM

## 2017-09-11 DIAGNOSIS — M3219 Other organ or system involvement in systemic lupus erythematosus: Principal | ICD-10-CM

## 2017-10-08 ENCOUNTER — Ambulatory Visit: Admit: 2017-10-08 | Discharge: 2017-10-08 | Payer: MEDICARE

## 2017-10-08 ENCOUNTER — Institutional Professional Consult (permissible substitution): Admit: 2017-10-08 | Discharge: 2017-10-08 | Payer: MEDICARE

## 2017-10-08 DIAGNOSIS — J3089 Other allergic rhinitis: Principal | ICD-10-CM

## 2017-10-08 DIAGNOSIS — M3219 Other organ or system involvement in systemic lupus erythematosus: Principal | ICD-10-CM

## 2017-10-15 ENCOUNTER — Ambulatory Visit: Admit: 2017-10-15 | Discharge: 2017-10-16 | Payer: MEDICARE | Attending: Ophthalmology | Primary: Ophthalmology

## 2017-10-15 DIAGNOSIS — H5203 Hypermetropia, bilateral: Secondary | ICD-10-CM

## 2017-10-15 DIAGNOSIS — H02889 Meibomian gland dysfunction of unspecified eye, unspecified eyelid: Secondary | ICD-10-CM

## 2017-10-15 DIAGNOSIS — H524 Presbyopia: Secondary | ICD-10-CM

## 2017-10-15 DIAGNOSIS — H2513 Age-related nuclear cataract, bilateral: Secondary | ICD-10-CM

## 2017-10-15 DIAGNOSIS — H04129 Dry eye syndrome of unspecified lacrimal gland: Principal | ICD-10-CM

## 2017-10-15 DIAGNOSIS — H52203 Unspecified astigmatism, bilateral: Secondary | ICD-10-CM

## 2017-10-15 MED ORDER — DOXYCYCLINE HYCLATE 100 MG TABLET
ORAL_TABLET | Freq: Every day | ORAL | 11 refills | 0 days | Status: CP
Start: 2017-10-15 — End: 2017-11-14

## 2017-10-31 ENCOUNTER — Ambulatory Visit: Admit: 2017-10-31 | Discharge: 2017-11-01 | Payer: MEDICARE

## 2017-10-31 DIAGNOSIS — J3089 Other allergic rhinitis: Principal | ICD-10-CM

## 2017-10-31 DIAGNOSIS — M3219 Other organ or system involvement in systemic lupus erythematosus: Principal | ICD-10-CM

## 2017-11-26 MED ORDER — CLONAZEPAM 1 MG TABLET
ORAL_TABLET | 1 refills | 0 days | Status: CP
Start: 2017-11-26 — End: 2018-02-23

## 2017-12-01 MED ORDER — OMEPRAZOLE 40 MG CAPSULE,DELAYED RELEASE
ORAL_CAPSULE | 1 refills | 0 days | Status: CP
Start: 2017-12-01 — End: 2017-12-10

## 2017-12-03 ENCOUNTER — Institutional Professional Consult (permissible substitution): Admit: 2017-12-03 | Discharge: 2017-12-03 | Payer: MEDICARE

## 2017-12-03 ENCOUNTER — Ambulatory Visit: Admit: 2017-12-03 | Discharge: 2017-12-03 | Payer: MEDICARE

## 2017-12-03 DIAGNOSIS — M3219 Other organ or system involvement in systemic lupus erythematosus: Principal | ICD-10-CM

## 2017-12-03 DIAGNOSIS — J3089 Other allergic rhinitis: Principal | ICD-10-CM

## 2017-12-04 MED ORDER — LEVOFLOXACIN 750 MG TABLET
ORAL_TABLET | Freq: Every day | ORAL | 0 refills | 0 days | Status: CP
Start: 2017-12-04 — End: 2017-12-14

## 2017-12-10 ENCOUNTER — Ambulatory Visit
Admit: 2017-12-10 | Discharge: 2017-12-11 | Payer: MEDICARE | Attending: Psychiatric/Mental Health | Primary: Psychiatric/Mental Health

## 2017-12-10 ENCOUNTER — Ambulatory Visit: Admit: 2017-12-10 | Discharge: 2017-12-11 | Payer: MEDICARE

## 2017-12-10 DIAGNOSIS — F3341 Major depressive disorder, recurrent, in partial remission: Principal | ICD-10-CM

## 2017-12-10 DIAGNOSIS — Z8579 Personal history of other malignant neoplasms of lymphoid, hematopoietic and related tissues: Principal | ICD-10-CM

## 2017-12-10 DIAGNOSIS — G894 Chronic pain syndrome: Secondary | ICD-10-CM

## 2017-12-10 DIAGNOSIS — F4322 Adjustment disorder with anxiety: Secondary | ICD-10-CM

## 2017-12-10 DIAGNOSIS — Z79891 Long term (current) use of opiate analgesic: Secondary | ICD-10-CM

## 2017-12-10 DIAGNOSIS — F419 Anxiety disorder, unspecified: Secondary | ICD-10-CM

## 2017-12-10 DIAGNOSIS — Z79899 Other long term (current) drug therapy: Secondary | ICD-10-CM

## 2017-12-10 DIAGNOSIS — G8929 Other chronic pain: Secondary | ICD-10-CM

## 2017-12-10 DIAGNOSIS — M797 Fibromyalgia: Secondary | ICD-10-CM

## 2017-12-10 DIAGNOSIS — R5383 Other fatigue: Secondary | ICD-10-CM

## 2017-12-10 DIAGNOSIS — E782 Mixed hyperlipidemia: Secondary | ICD-10-CM

## 2017-12-10 DIAGNOSIS — I10 Essential (primary) hypertension: Secondary | ICD-10-CM

## 2017-12-10 DIAGNOSIS — Z9149 Other personal history of psychological trauma, not elsewhere classified: Secondary | ICD-10-CM

## 2017-12-10 DIAGNOSIS — K219 Gastro-esophageal reflux disease without esophagitis: Secondary | ICD-10-CM

## 2017-12-10 DIAGNOSIS — G43109 Migraine with aura, not intractable, without status migrainosus: Secondary | ICD-10-CM

## 2017-12-10 DIAGNOSIS — G43709 Chronic migraine without aura, not intractable, without status migrainosus: Secondary | ICD-10-CM

## 2017-12-10 DIAGNOSIS — R300 Dysuria: Secondary | ICD-10-CM

## 2017-12-10 DIAGNOSIS — Z8659 Personal history of other mental and behavioral disorders: Secondary | ICD-10-CM

## 2017-12-10 MED ORDER — TRAMADOL 50 MG TABLET
ORAL_TABLET | Freq: Every day | ORAL | 3 refills | 0 days | Status: CP | PRN
Start: 2017-12-10 — End: 2018-04-08

## 2017-12-10 MED ORDER — DULOXETINE 60 MG CAPSULE,DELAYED RELEASE
ORAL_CAPSULE | Freq: Every day | ORAL | 1 refills | 0 days | Status: CP
Start: 2017-12-10 — End: 2018-06-03

## 2017-12-10 MED ORDER — TRAZODONE 50 MG TABLET
ORAL_TABLET | Freq: Every evening | ORAL | 1 refills | 0.00000 days | Status: CP
Start: 2017-12-10 — End: 2018-03-16

## 2017-12-10 MED ORDER — OMEPRAZOLE 40 MG CAPSULE,DELAYED RELEASE
ORAL_CAPSULE | Freq: Every day | ORAL | 3 refills | 0.00000 days | Status: CP
Start: 2017-12-10 — End: 2018-11-11

## 2017-12-10 MED ORDER — HYDROCHLOROTHIAZIDE 25 MG TABLET
ORAL_TABLET | Freq: Every day | ORAL | 3 refills | 0 days | Status: CP
Start: 2017-12-10 — End: 2018-01-23
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
## 2017-12-17 ENCOUNTER — Ambulatory Visit: Admit: 2017-12-17 | Discharge: 2017-12-18 | Payer: MEDICARE | Attending: Neurology | Primary: Neurology

## 2017-12-17 DIAGNOSIS — M545 Low back pain: Secondary | ICD-10-CM

## 2017-12-17 DIAGNOSIS — G8929 Other chronic pain: Secondary | ICD-10-CM

## 2017-12-17 DIAGNOSIS — G43709 Chronic migraine without aura, not intractable, without status migrainosus: Principal | ICD-10-CM

## 2017-12-17 MED ORDER — GALCANEZUMAB-GNLM 120 MG/ML SUBCUTANEOUS PEN INJECTOR
SUBCUTANEOUS | 11 refills | 0.00000 days | Status: CP
Start: 2017-12-17 — End: 2018-08-06

## 2017-12-17 NOTE — Unmapped (Signed)
Per test claim for [facility] 120 MG/ML INJECTION at the [facility], patient needs Medication Assistance Program for Prior Authorization.

## 2017-12-17 NOTE — Unmapped (Addendum)
Try to move daily. Consider Physical Therapy for your back and neck- we discussed PT, Aquatherapy, swimming.   We will try to track records of your spine surgery.       We discussed EMGALITY for your headaches- once approved, we can set you up for injections/training.   Try avoid daily medications for your headache.     Consider these supplements for 90 days:     ?? Co Enzyme Q10 100 MG daily in the morning.   ?? Potassium-Magnesium-Aspartate 250 MG ([employer] brand is ideal). Do not take with milk/or other supplements. 2 at night. If diarrhea, reduce to one at night.   ?? Riboflavin (Vitamin B2): 200 MG daily in the morning. May turn your urine orange/red.      Warmest Regards,    Dr. Hollie Beach  Board Certified: Adult Neurology  [facility] Division of General Medicine and Clinical Epidemiology  [place], [place]      It has been my pleasure to participate in your care.   You may reach me via MyChart, phone or fax.     Please note that while I will try to answer messages in a timely manner, all urgent issues should be directed to your PCP.       Helpful Contact Information :  ?? Appointments/ Internal Medicine Clinic: [phone]    ?? HIPPA Compliant Facsimile: [phone]  ?? Same Day Clinic:  [phone].  8am-5pm Monday through Friday   ?? After Hours:  [phone].  A nurse will answer and can help you decide what kind of medication attention you need.   ?? [facility]:  [phone].  If you are sick, but not injured:  9am-8pm Mon-Sun.  [address], [place], [place] off of [address].  All walk-ins accepted.    ?? [facility] at the [facility]:  [phone].  7am-9pm Mon-Fri; 12pm-5pm [address]  [address], [place] [address].  All walk-ins accepted.    ??    Thank you for choosing [facility].

## 2017-12-17 NOTE — Unmapped (Signed)
NEUROLOGY REPORT  [facility]    Date: December 17, 2017  Patient Name: Olivia Stout  MRN: [id number]  PCP:  Lolita Lenz  Time in with patient: 2:50 PM  Time out with patient: 3:30 PM  Prior visit with this neurologist: 08/22/2017    Assessment and Plan/ Clinical Decision Making          1. Chronic migraine    She clearly has 2 types of headache including chronic migraine superimposed on medication overuse headache related to her chronic use of multiple pain medications for her other pain as well.   Less medication overuse.     She received Botox 3/8. Complained of ptosis- no change. Did not feel that HA has been helped with Botox.   Reviewed recent Brain MRI/A- normal.     We have discussed both abortive and prophylactic treatments of her migraine.    She has not tolerated rizatriptan or sumatriptan due to side effects.   No further triptans offered.     At this time would like her to consider avoiding Naprosyn and other pain medications.  Consider eTNS or VNS for migraine- not covered by insurance- out of pocket cost is high- given her information about the site.   Headache calendar importance reviewed.     For prophylactic medications, oral agents are contraindicated given her multiple comorbidities and other medications and a very high risk for polypharmacy.  Therefore all of the oral agents currently recommended by the American neurological Society are not indicated for this patient.  However, injectable agents such as Botox and/or monoclonal antibodies namely CGRP antagonists are a reasonable option given that they will have lower medication interaction and risk.  In this category, Botox is the superior agent given that it is unlikely to interact with her other medications.  The monoclonal antibody medications are new to the market and it is unclear how they will interact with her Benlysta.  She has no history of cardiac disease so they are definitely an option.    She has multiple barriers to treatment and I have counseled her to seek assistance for enrollment in patient assistance program at [facility].    Emgality reviewed- once approved will get the process rolling.     Headache triggers reviewed in detail.  Sleep hygiene reviewed.     Will hold off on deciding about Botox #2 until closer to the time.     2. Chronic right-sided low back pain, with sciatica presence unspecified  Offered PT- patient declines due to headache.     HEALTH EDUCATION/HEALTH LITERACY: PATIENT EDUCATION was provided. Reviewed the differential diagnosis, plan of care in detail, as well as other elements as detailed above. The benefits and risks of each of the above procedures, benefits and alternative options were reviewed with the patient.   Medication Management: There is a high risk for medication side effects, which will require ongoing monitoring and dose adjustment. Medication side effect profile was reviewed in detail with patient. Medication safety and drug interactions reviewed on ERx.   Patient Counselling: All the patient's questions and concerns were addressed to their stated satisfaction .    Barriers to Care: Multiple factors including depression, comorbid medical conditions, ongoing biological therapy, cost of medications and cost of care, access to care.Very anxious.   Total face-to-face time included: 40 minutes > 50 % in direct consultation reviewing details of history, examination and data findings, discussing my clinical reasoning and clinical impression, as well as a review of my recommendations  for a plan of treatment as documented above. Additional time was provided to address alternative options for care, health literacy regarding the differential diagnosis, testing and medication options, the benefits of current plan as well as an opportunity to address all the patient's questions and concerns to their reported satisfaction. A written summary was provided to the patient at the time of the visit, as well as instructions on how to access My Chart. My contact information was provided along with instructions on how to reach the administrative office and the clinic. Very anxious. Recent birthday.     Return in about 4 months (around 04/18/2018) for Return for re-evaluation.Once Emgality arrives, will set up a visit. .         Subjective:          SOURCE: The history is obtained from the patient who appears to be a vague historian. She has a hard time answering questions even with direct questions. Additional history is obtained from review of available medical records. Open-ended and close-ended questions, as well as questionnaire data and review of EPIC chart was used to obtain details of the history.    History of Present Illness:     Olivia Stout is a 67 y.o. right handed female seen in followup for evaluation of Headache Recurrent or Known Dx Migraines (Evaluation and management)    CURRENT VISIT:   Improved with Cold rag, rest.   No better with Botox, ES Tylenol.   Has not yet seen her Eye Provider- is on Plaquenil. Does have dry eye as well.   Recent sinus infection- was better while on antibiotics- now worse again.   HA is daily, worse as the day wears on.   Pain is over left eye/forehead/ temple.   Ptosis persists- unchanged.       New problem: Lower back pain on the right occasionally right leg pain- she had back surgery 2 years ago for same problem. May 2017- surgery in [place], [place]. Dr Gwendlyn Deutscher [facility])    Current activity: Walks 15-20 minutes every other day. Last had PT: (limited by dizziness)- 3 years ago.       PRIOR VISIT: (May)    She is here today with droopy eyelids. This was noticed about a week after Botox Rx (sent me a MyChart message). Today also complains of diplopia. Feels that her eyes are crossed. Initially states headaches are not better, then stated that much better and only on the left side.     She reports that her headaches have changed: perhaps 2 1/2 - 3 weeks ago.   Her headache is different now- focused over the left the temple. She no longer has pain on the right side. During the visit, she also reports an improvement overall in her headaches.   Has a new psychiatrist.   Feels like tension, moving over the eyes. Throbbing.   Frequency: Every day.   Currently taking: extra strength Tylenol with not much relief.   She reports that the Triptans intensified the headache- no relief. She reports noticing the intensification of the headache. This occurred some time after taking the medication- 30-45 minutes. No other side effects. Tried half with no improvement. She has not taken more than 1 and 1/2  dose of either.    She reports photophobia and phonophobia, nausea.   She did not report symptoms to Ophthalmology on 3/20 and states she did not have those symptoms then.   She notes that a week and a half  ago, she noticed double vision- worse when looking up close, and worse this morning. Reports horizontal diplopia. Never previously.   When looking at the distance, some discomfort over the brows. Worse this morning.     Prior history: April  She reports that she has had headaches for months.  She states that she has a long history of migraines, most recently had migraines about 5 or 6 years ago.  These were always typically severe headaches that affected her quality of life and her ability to function worse with bright lights and improved with laying down.  Her headaches resolved about 5 or 6 years ago and returned last year associated with bad traumatic stress [SIC].  Since then, she feels that her headaches have been so bad that she is unable to get any relief.  She states that she first noticed this around May or June of last year.  They are almost always associated with photophobia and they hurt really badly that she cannot function.  She has trouble lying down because her head hurts when she lies down.  The pain is mostly over the temples but can occur on either side.  Her headaches are associated with nausea changes in her vision and pain that radiates into both arms worse on the left than on the right that she describes as a nerve pain that is uncomfortable.  The headache is both squeezing and tight and also throbbing.  Her only relief is with some lotion of her head and neck and Tylenol but it is mild.  Her headaches typically last all day long but there is some diurnal variation and that her symptom the day goes on.  Her headaches are there every day with no relief.  Initially she thought lisinopril might be worsening her headache.  She has since discontinued lisinopril with no improvement.    She does take significant medications every day and her list was reviewed today in addition she takes Naprosyn, tizanidine, gabapentin and tramadol for ongoing pain related to her back, some history of back surgery as well as her lupus.  She has been taking these medications for some time predating the onset of her worsening headaches.    Her sleep is reasonable with medications she does have significant dreams but denies any snoring.  She uses meditation to help her.  She has a strong family history of migraines she has 1 daughter with headaches a sister with headaches and her father had severe headaches.  She has a long history of lupus and is currently on Benlysta.  She moved to [place] at age 60 and recently moved back from [place] in August after her husband left her to live with his mother which has been very traumatic for her.  He currently sees a psychiatrist and her access to healthcare is limited by her insurance.  She has had no headache free days in the last 30 days and her quality of life and ADLs are affected by her headache.  She is currently on no abortive medications but is on several medications that she takes for chronic pain including gabapentin and tizanidine and Cymbalta.  She recalls taking triptan's many years ago but not recently.  She has no history of heart disease.  Other studies/data: normal brain MRI/MRA for same indication in 2006 at [facility]- results reviewed.     Sleep Study: no  Imaging: yes    TRAVEL HISTORY: [place]  CHILDHOOD HISTORY: Headaches in her teens.  Review of  Systems: A 10-systems review was performed. Significant comorbid conditions including trouble sleeping, dry eye, back pain, arm pain, stress and depression.  Unless otherwise identified here, in HPI or by patient  no additional symptoms identified.     Past Medical History:   Diagnosis Date   ??? Allergy desensitization therapy    ??? Anxiety    ??? Arthritis    ??? Back pain, chronic 2003    pinched nerve   ??? Depression    ??? Disease of thyroid gland    ??? Dry eyes     Had permanent cautery lower canula, tried plugs  and partial but caused waatering.   ??? Dysphagia    ??? Dysplastic nevi    ??? Eye trauma     wood chip with corneal abrasion right eye.  metal right eye removed also with abrasion   ??? Fibromyalgia    ??? Fibromyalgia, primary    ??? GERD (gastroesophageal reflux disease)    ??? High cholesterol    ??? History of gastric ulcer    ??? History of underactive thyroid    ??? Hypertension     watching   ??? IBS (irritable bowel syndrome)    ??? Long term prescription benzodiazepine use 06/29/2017   ??? Lupus (CMS-HCC)    ??? Lymphoma (CMS-HCC)     in remission; s/p chemotherapy - NON HODGKINS   ??? Major depressive disorder    ??? Mixed stress and urge urinary incontinence 02/08/2013   ??? Sjoegren syndrome    ??? Urinary, incontinence, stress female    ??? Vaginal prolapse          Past Surgical History:   Procedure Laterality Date   ??? BACK SURGERY  07/27/2015   ??? ESOPHAGEAL DILATION     ??? FRACTURE SURGERY      left forearm   ??? HYSTERECTOMY     ??? KNEE SURGERY     ??? LAPAROSCOPIC NISSEN FUNDOPLICATION     ??? PR ANAL PRESSURE RECORD Left 12/01/2012    Procedure: ANORECTAL MANOMETRY;  Surgeon: None None;  [facility];  Service: Gastroenterology   ??? PR BREATH HYDROGEN TEST N/A 12/01/2012    Procedure: BREATH HYDROGEN TEST;  Surgeon: None None;  [facility];  Service: Gastroenterology   ??? SPINE SURGERY     ??? TAH and bladder surgery      partial   ??? TONSILLECTOMY         Social History     Social History Narrative   ??? Not on file       Social History     Tobacco Use   Smoking Status Never Smoker   Smokeless Tobacco Never Used         reports that she does not drink alcohol.      Family History   Problem Relation Age of Onset   ??? Diabetes Mother    ??? Crohn's disease Mother    ??? Emphysema Mother    ??? Glaucoma Mother    ??? Asthma Mother    ??? Osteoporosis Mother    ??? Parkinsonism Mother    ??? Stroke Father    ??? Heart disease Father    ??? Cancer Father    ??? Migraines Father    ??? Hypertension Sister    ??? Migraines Sister    ??? Migraines Daughter    ??? No Known Problems Son    ??? No Known Problems Son    ??? ADD /  ADHD Neg Hx    ??? Alcohol abuse Neg Hx    ??? Anxiety disorder Neg Hx    ??? Bipolar disorder Neg Hx    ??? Dementia Neg Hx    ??? Depression Neg Hx    ??? Drug abuse Neg Hx    ??? OCD Neg Hx    ??? Paranoid behavior Neg Hx    ??? Physical abuse Neg Hx    ??? Schizophrenia Neg Hx    ??? Sexual abuse Neg Hx    ??? Seizures Neg Hx    ??? Macular degeneration Neg Hx    ??? Blindness Neg Hx          Allergies   Allergen Reactions   ??? Amoxicillin Swelling     Sweating and swelling in throat      ??? Ketorolac Shortness Of Breath   ??? Sulfa (Sulfonamide Antibiotics) Other (See Comments)     welts in my mouth   ??? Zocor [Simvastatin] Other (See Comments)     Had issues w/ bearing weight and pain in legs. *Cannot have any statins at all.*     ??? Statins-Hmg-Coa Reductase Inhibitors Other (See Comments)     Leg paralysis  Leg paralysis       Current Outpatient Medications   Medication Sig Dispense Refill   ??? acetaminophen (TYLENOL) 500 MG tablet Take 500 mg by mouth every six (6) hours as needed for pain.     ??? albuterol (PROVENTIL HFA;VENTOLIN HFA) 90 mcg/actuation inhaler Inhale 2 puffs. As needed     ??? ammonium lactate (AMLACTIN) 12 % cream Apply topically once daily. (Patient not taking: Reported on 12/10/2017) 400 g 6   ??? ammonium lactate (LAC-HYDRIN) 12 % lotion As needed     ??? belimumab (BENLYSTA) 400 mg SolR Infuse 720 mg into a venous catheter every thirty (30) days.     ??? cevimeline (EVOXAC) 30 mg capsule Take 30 mg by mouth.     ??? cholecalciferol, vitamin D3, (VITAMIN D3) 1,000 unit capsule Take 1,000 Units by mouth daily.     ??? clonazePAM (KLONOPIN) 1 MG tablet Take one tablet for sleep as needed at bed time 30 tablet 1   ??? cyanocobalamin 1000 MCG tablet Take 1,000 mcg by mouth daily.     ??? DULoxetine (CYMBALTA) 60 MG capsule Take 1 capsule (60 mg total) by mouth daily. 90 capsule 1   ??? EPINEPHrine (EPIPEN) 0.3 mg/0.3 mL injection Inject 0.3 mL (0.3 mg total) into the muscle Take as directed. Note:carry at all times (Patient not taking: Reported on 12/10/2017) 1 each 3   ??? estradiol (ESTRACE) 0.01 % (0.1 mg/gram) vaginal cream Insert into the vagina.     ??? fluticasone propionate (FLONASE) 50 mcg/actuation nasal spray 2 sprays by Each Nare route Two (2) times a day. 16 mL prn   ??? gabapentin (NEURONTIN) 100 MG capsule Take 100 mg (1 cap) each morning and 300 mg (3 caps) each evening 120 capsule 3   ??? galcanezumab-gnlm (EMGALITY) 120 mg/mL injection Inject 120 mg under the skin every thirty (30) days. 1 mL 11   ??? hydroCHLOROthiazide (HYDRODIURIL) 25 MG tablet Take 0.5 tablets (12.5 mg total) by mouth daily. 46 tablet 3   ??? levocetirizine (XYZAL) 5 MG tablet 1 daily prn allergy 30 tablet 6   ??? levothyroxine (SYNTHROID, LEVOTHROID) 50 MCG tablet Take 1 tablet (50 mcg total) by mouth daily. 90 tablet 1   ??? lidocaine (LIDODERM) 5 % patch Place 1 patch  on the skin daily as needed (12 hours on/12 hours off). Apply to affected area for 12 hours only each day (then remove patch) 60 patch 5   ??? multivitamin (TAB-A-VITE/THERAGRAN) per tablet Take 1 tablet by mouth daily.     ??? naproxen (NAPROSYN) 500 MG tablet Take 1 tablet (500 mg total) by mouth 2 (two) times a day with meals. TAKE 1 TABLET (500 MG TOTAL) BY MOUTH 2 (TWO) TIMES A DAY WITH MEALS. 5 tablet 0   ??? omega 3-dha-epa-fish oil 900 mg-360 mg- 455 mg-1,000 mg cap      ??? omeprazole (PRILOSEC) 40 MG capsule Take 1 capsule (40 mg total) by mouth daily. 90 capsule 3   ??? pyridoxine, vitamin B6, (B-6) 100 MG tablet Take 100 mg by mouth daily.     ??? SPONIX ARTHRITIS-MUSCLE PAIN 4-10-0.035 % srlo      ??? tiZANidine (ZANAFLEX) 2 MG tablet Take 0.5 tablets (1 mg total) by mouth nightly as needed (back pain). Tapering off. Plan to discontinue after 6-8 weeks. (Patient not taking: Reported on 12/10/2017) 30 tablet 1   ??? traMADol (ULTRAM) 50 mg tablet Take 0.5 tablets (25 mg total) by mouth daily as needed for pain or pain,severe (7-10). 15 tablet 3   ??? traZODone (DESYREL) 50 MG tablet Take 1 tablet (50 mg total) by mouth nightly. 90 tablet 1     Current Facility-Administered Medications   Medication Dose Route Frequency Provider Last Rate Last Dose   ??? galcanezumab-gnlm (EMGALITY) injection 240 mg  240 mg Subcutaneous Once Nataliah Hatlestad Ulysees Barns, MD                Objective:        GENERAL PHYSICAL EXAM:    Vital Signs: Reviewed today.   BP 149/86  - Pulse 76  - Temp 36.8 ??C (98.2 ??F)  - Resp 16  - Ht 157.5 cm (5' 2)  - Wt 71.1 kg (156 lb 12 oz)  - SpO2 96%  - BMI 28.67 kg/m??   Estimated body mass index is 28.67 kg/m?? as calculated from the following:    Height as of this encounter: 157.5 cm (5' 2).    Weight as of this encounter: 71.1 kg (156 lb 12 oz).  Facility age limit for growth percentiles is 20 years.  BP meds taken in the evening.     Wt Readings from Last 8 Encounters:   12/17/17 71.1 kg (156 lb 12 oz)   12/10/17 71.1 kg (156 lb 11.2 oz)   12/10/17 71.3 kg (157 lb 3.2 oz)   12/03/17 70.8 kg (156 lb)   10/31/17 71.7 kg (158 lb)   10/08/17 70.3 kg (155 lb)   09/11/17 70.3 kg (155 lb)   09/03/17 71.7 kg (158 lb)       General Appearance: The patient was well-groomed and neat, engaged and cooperative.   Head: Atraumatic, normocephalic.   NEUROLOGICAL EXAMINATION   Mental Status  The patient is alert and oriented to time, place and person.   Attention/concentration adequate throughout the examination.   Speech and language were normal at the bedside.  Memory was intact for history, short and long-term.   Affect is very anxious.     Cranial nerves  II:  Pupils equal, large (41mm-3mm) round and reactive to light (both direct and consensual reflexes).  Fundoscopic exam is normal.  Visual fields at bedside testing are full.  + photophobia  III, IV, VI:  Extraocular eye movements are normal and  full with no nystagmus.  No diplopia at the bedside.  Mild Ptosis bilaterally. Unchanged from the prior visit.   Remainder normal.      Motor / Musculoskeletal   Strength is normal.   Reflexes: brisk bilaterally- unchanged. Plantars flexor.   Gait: Normal gait and stance.        Diagnostic Studies and Review of Records:     DATA   I have independently reviewed the studies as noted.   1. Medical Records: reviewed- prior neurology visit- EMG with Dr. Vickki Muff in 2011. CONCLUSIONNormal study. There is no evidence for a mononeuropathy or cervical radiculopathy.  Radiology: Results reviewed and report reviewed- Mra Head Wo Contrast  Result Date: 05/07/2017  EXAM: Magnetic resonance imaging, brain without and with contrast material, AND Magnetic resonance angiography, head. DATE: 05/07/2017 10:59 AM ACCESSION: [id number] Bethann Humble [id number] UN DICTATED: 05/07/2017 11:11 AM INTERPRETATION LOCATION: [facility]     CLINICAL INDICATION: 67 years old Female with G43.709-Chronic migraine      COMPARISON: 02-27-05 MR     TECHNIQUE: Multiplanar, multisequence MR imaging of the brain was performed without and with I.V. contrast.  Time of flight MRA of intracranial arterial circulation was also performed.     FINDINGS: No focal parenchymal signal abnormality.     No midline shift or mass lesion. No intracranial hemorrhage or extra-axial fluid collection.     No evidence of acute infarct. No DWI signal abnormality.     No abnormal enhancement.     Intracranial MRA does not demonstrate any aneurysms or high grade stenoses.   --Normal MRI/MRA of the brain.      Mri Brain W Wo Contrast  Result Date: 05/07/2017  EXAM: Magnetic resonance imaging, brain without and with contrast material, AND Magnetic resonance angiography, head. DATE: 05/07/2017 10:59 AM ACCESSION: [id number] Bethann Humble [id number] UN DICTATED: 05/07/2017 11:11 AM INTERPRETATION LOCATION: [facility]     CLINICAL INDICATION: 67 years old Female with G43.709-Chronic migraine      COMPARISON: 02-27-05 MR     TECHNIQUE: Multiplanar, multisequence MR imaging of the brain was performed without and with I.V. contrast.  Time of flight MRA of intracranial arterial circulation was also performed.     FINDINGS: No focal parenchymal signal abnormality.     No midline shift or mass lesion. No intracranial hemorrhage or extra-axial fluid collection.     No evidence of acute infarct. No DWI signal abnormality.     No abnormal enhancement.     Intracranial MRA does not demonstrate any aneurysms or high grade stenoses.   --Normal MRI/MRA of the brain.      3.Labs: reviewed   Lab Results   Component Value Date    WBC 5.6 09/11/2017    HGB 14.3 09/11/2017    HCT 43.8 09/11/2017    PLT 267 09/11/2017       Lab Results   Component Value Date    NA 138 07/15/2017    K 4.4 07/15/2017    CL 101 07/15/2017    CO2 28.0 07/15/2017    BUN 14 09/11/2017    CREATININE 0.79 09/11/2017    GLU 94 07/15/2017    CALCIUM 10.4 (H) 07/15/2017       Lab Results   Component Value Date    BILITOT 0.8 03/27/2017    PROT 7.7 03/27/2017    ALBUMIN 4.8 03/27/2017    ALT 46 03/27/2017    AST 37 03/27/2017    ALKPHOS 87 03/27/2017    GGT 42 12/31/2011  Dr. Hollie Beach    CC: Lolita Lenz, MD    Dictated using voice-activated software. Typographical errors may exist.

## 2017-12-22 MED ORDER — CLOTRIMAZOLE 1 % TOPICAL CREAM
Freq: Two times a day (BID) | TOPICAL | 0 refills | 0 days | Status: CP
Start: 2017-12-22 — End: 2018-02-17

## 2017-12-22 MED ORDER — GALCANEZUMAB-GNLM 120 MG/ML SUBCUTANEOUS PEN INJECTOR
Freq: Once | SUBCUTANEOUS | 0 refills | 0.00000 days | Status: CP
Start: 2017-12-22 — End: 2018-05-15
  Filled 2017-12-29: qty 2, 30d supply, fill #0

## 2017-12-22 MED ORDER — EMPTY CONTAINER
PRN refills | 0 days
Start: 2017-12-22 — End: ?

## 2017-12-22 NOTE — Unmapped (Addendum)
[facility]   Patient Onboarding/Medication Counseling    Ms.Kempton is a 67 y.o. female with chronic headache who I am counseling today on initiation of therapy.    Medication: Emgality 120mg /ml    Verified patient's date of birth / HIPAA.      Education Provided: ?    Dose/Administration discussed: Inject the contents of 2 pens under the skin on day 1, then 1 pen every month thereafter. This medication should be taken  without regard to food.  Stressed the importance of taking medication as prescribed and to contact provider if that changes at any time.  Discussed missed dose instructions.    Storage requirements: this medicine should be stored in the refrigerator.     Side effects / precautions discussed: Discussed common side effects, including but not limited to injection site reactions. If patient experiences signs/sympoms of an allergic reaction (hives/rash/itching, red/swollen/blistered/peeling skin, wheezing, tightness in chest/throat, difficultly breathing/swallowing/talking, unusual hoarseness, swelling of mouth/face/lips/tongue/throat, etc.), sob, etc., they need to call the doctor.  Patient will receive a drug information handout with shipment.    Handling precautions / disposal reviewed:  Patient will dispose of needles in a sharps container or empty laundry detergent bottle.    Drug Interactions: other medications reviewed and up to date in Epic.  No drug interactions identified.    Comorbidities/Allergies: reviewed and up to date in Epic.    Verified therapy is appropriate and should continue      Delivery Information    Medication Assistance provided: Prior Authorization    Anticipated copay of $3.80 reviewed with patient. Verified delivery address in Epic.    Scheduled delivery date: 12/29/17 - first loading dose sent to clinic for administration there per MD request (appointment scheduled for 01/21/18)    Explained that medication will be delivered by clinic courier. and this shipment will not require a signature.      Explained the services we provide at [facility] and that each month we would call to set up refills.  Stressed importance of returning phone calls so that we could ensure they receive their medications in time each month.  Informed patient that we should be setting up refills 7-10 days prior to when they will run out of medication.  Informed patient that welcome packet will be sent.      Patient verbalized understanding of the above information as well as how to contact the pharmacy at [phone] option 4 with any questions/concerns.  The pharmacy is open Monday through Friday 8:30am-4:30pm.  A pharmacist is available 24/7 via pager to answer any clinical questions they may have.        Patient Specific Needs      ? Patient has no physical, cognitive, or cultural barriers.    ? Patient prefers to have medications discussed with  Patient     ? Patient is able to read and understand education materials at a high school level or above.    ? Patient's primary language is  English           Lupita Shutter  [facility] Specialty Pharmacist

## 2017-12-22 NOTE — Unmapped (Signed)
[facility] Specialty Medication Referral: PA APPROVED    Medication (Brand/Generic): EMGALITY    Initial Test Claim completed with resulted information below:  No PA required  Patient ABLE to fill at [facility]  Insurance Company:  [employer]  Anticipated Copay: $3.80  Is anticipated copay with a copay card or grant? NO    As Co-pay is under $25 defined limit, per policy there will be no further investigation of need for financial assistance at this time unless patient requests. This referral has been communicated to the provider and handed off to the [facility] team for further processing and filling of prescribed medication.   ______________________________________________________________________  Please utilize this referral for viewing purposes as it will serve as the central location for all relevant documentation and updates.

## 2017-12-22 NOTE — Unmapped (Signed)
Addended by: Verne Grain on: 12/22/2017 04:59 PM     Modules accepted: Orders

## 2017-12-25 MED ORDER — NAPROXEN 500 MG TABLET
ORAL_TABLET | Freq: Two times a day (BID) | ORAL | 11 refills | 0 days | Status: CP
Start: 2017-12-25 — End: ?

## 2017-12-26 NOTE — Unmapped (Signed)
Contacted patient to determine if she has her Emgality prescription with her. She stated she does not and someone at the pharmacy advised her that they would send the script over to [facility]. Did not receive prescription this week. Advised patient that we did not receive shipment and once we do we will call to get her scheduled for her injection. Patient stated I am very unhappy about this because I had to plan my day accordingly to receive this injection. Advised patient that Olivia Stout will follow up with her about the shipment.

## 2017-12-29 MED FILL — EMGALITY PEN 120 MG/ML SUBCUTANEOUS PEN INJECTOR: 30 days supply | Qty: 2 | Fill #0 | Status: AC

## 2017-12-29 NOTE — Unmapped (Signed)
Patient's Emgality has arrived to clinic.

## 2017-12-29 NOTE — Unmapped (Signed)
Called pt to tell her Emgality is here & ask if she wants to schedule it. There was no answer and her voice mail is full. I will try again tomorrowl

## 2017-12-29 NOTE — Unmapped (Addendum)
Patient presented in clinic in regards to receiving loading dose of Emgality. Pulled patient to the back in room so that we could speak in private. Advised patient that we have not received medication and it won't be here until this afternoon. Patient was very upset. She stated I spoke to nurse on Friday who advised to come into clinic around 11a. I advised patient that when we spoke on Friday I stated to her that once we receive the medication, Rosey Bath will be giving her a call to schedule her appointment. Patient stated This is ridiculous. I am on the phone with the pharmacy right now telling them they better hurry up and get my medication here. The communication sucks around here. I spoke with Rosey Bath this morning to advise her that I was here already. I did receive a voicemail from [facility] and I wasn't too happy about it. This is [facility] fault because they should have been had my medication. Tell Dr. Rulon Eisenmenger I said to forget it. I'm done. I advised the patient that the medication has to be picked up from [facility] and shipped here and that Dr. Rulon Eisenmenger is willing to give the injection this afternoon. Patient stated, I am not waiting around any longer because it should have been here last week. Patient stormed out of the room.

## 2017-12-31 ENCOUNTER — Institutional Professional Consult (permissible substitution): Admit: 2017-12-31 | Discharge: 2018-01-01 | Payer: MEDICARE

## 2017-12-31 ENCOUNTER — Ambulatory Visit: Admit: 2017-12-31 | Discharge: 2018-01-01 | Payer: MEDICARE

## 2017-12-31 DIAGNOSIS — J3089 Other allergic rhinitis: Principal | ICD-10-CM

## 2017-12-31 DIAGNOSIS — M3219 Other organ or system involvement in systemic lupus erythematosus: Principal | ICD-10-CM

## 2017-12-31 DIAGNOSIS — C8580 Other specified types of non-Hodgkin lymphoma, unspecified site: Secondary | ICD-10-CM

## 2017-12-31 DIAGNOSIS — M797 Fibromyalgia: Secondary | ICD-10-CM

## 2017-12-31 LAB — CBC W/ AUTO DIFF
BASOPHILS ABSOLUTE COUNT: 0.1 10*9/L (ref 0.0–0.1)
BASOPHILS ABSOLUTE COUNT: 0.1 10*9/L (ref 0.0–0.1)
BASOPHILS RELATIVE PERCENT: 1.1 %
BASOPHILS RELATIVE PERCENT: 1.1 %
EOSINOPHILS ABSOLUTE COUNT: 0.2 10*9/L (ref 0.0–0.4)
EOSINOPHILS ABSOLUTE COUNT: 0.2 10*9/L (ref 0.0–0.4)
EOSINOPHILS RELATIVE PERCENT: 2.5 %
EOSINOPHILS RELATIVE PERCENT: 2.7 %
HEMATOCRIT: 44.4 % (ref 36.0–46.0)
HEMATOCRIT: 44.9 % (ref 36.0–46.0)
HEMOGLOBIN: 14.7 g/dL (ref 12.0–16.0)
HEMOGLOBIN: 14.8 g/dL (ref 12.0–16.0)
LARGE UNSTAINED CELLS: 2 % (ref 0–4)
LARGE UNSTAINED CELLS: 3 % (ref 0–4)
LYMPHOCYTES ABSOLUTE COUNT: 2 10*9/L (ref 1.5–5.0)
LYMPHOCYTES ABSOLUTE COUNT: 2 10*9/L (ref 1.5–5.0)
LYMPHOCYTES RELATIVE PERCENT: 31.9 %
LYMPHOCYTES RELATIVE PERCENT: 32.3 %
MEAN CORPUSCULAR HEMOGLOBIN CONC: 33.1 g/dL (ref 31.0–37.0)
MEAN CORPUSCULAR HEMOGLOBIN CONC: 33.2 g/dL (ref 31.0–37.0)
MEAN CORPUSCULAR HEMOGLOBIN: 30.3 pg (ref 26.0–34.0)
MEAN CORPUSCULAR HEMOGLOBIN: 30.5 pg (ref 26.0–34.0)
MEAN CORPUSCULAR VOLUME: 91.8 fL (ref 80.0–100.0)
MEAN CORPUSCULAR VOLUME: 91.9 fL (ref 80.0–100.0)
MEAN PLATELET VOLUME: 7 fL (ref 7.0–10.0)
MEAN PLATELET VOLUME: 7.1 fL (ref 7.0–10.0)
MONOCYTES RELATIVE PERCENT: 5.6 %
MONOCYTES RELATIVE PERCENT: 5.9 %
NEUTROPHILS ABSOLUTE COUNT: 3.5 10*9/L (ref 2.0–7.5)
NEUTROPHILS ABSOLUTE COUNT: 3.5 10*9/L (ref 2.0–7.5)
NEUTROPHILS RELATIVE PERCENT: 55.7 %
NEUTROPHILS RELATIVE PERCENT: 56 %
PLATELET COUNT: 305 10*9/L (ref 150–440)
PLATELET COUNT: 314 10*9/L (ref 150–440)
RED BLOOD CELL COUNT: 4.83 10*12/L (ref 4.00–5.20)
RED CELL DISTRIBUTION WIDTH: 13.8 % (ref 12.0–15.0)
RED CELL DISTRIBUTION WIDTH: 14 % (ref 12.0–15.0)
WBC ADJUSTED: 6.3 10*9/L (ref 4.5–11.0)
WBC ADJUSTED: 6.3 10*9/L (ref 4.5–11.0)

## 2017-12-31 LAB — COMPREHENSIVE METABOLIC PANEL
ALBUMIN: 4.9 g/dL (ref 3.5–5.0)
ALKALINE PHOSPHATASE: 74 U/L (ref 38–126)
ALT (SGPT): 38 U/L (ref 13–69)
ANION GAP: 10 mmol/L (ref 7–15)
BILIRUBIN TOTAL: 0.8 mg/dL (ref 0.0–1.2)
BLOOD UREA NITROGEN: 18 mg/dL (ref 7–21)
BUN / CREAT RATIO: 25
CALCIUM: 10.6 mg/dL — ABNORMAL HIGH (ref 8.5–10.2)
CHLORIDE: 99 mmol/L (ref 98–107)
CO2: 28 mmol/L (ref 22.0–30.0)
CREATININE: 0.72 mg/dL (ref 0.60–1.00)
EGFR CKD-EPI AA FEMALE: >90 mL/min/{1.73_m2} (ref >=60)
EGFR CKD-EPI NON-AA FEMALE: 88 mL/min/{1.73_m2} (ref >=60)
GLUCOSE RANDOM: 83 mg/dL (ref 65–179)
POTASSIUM: 5 mmol/L (ref 3.5–5.0)
PROTEIN TOTAL: 8.1 g/dL (ref 6.5–8.3)
SODIUM: 137 mmol/L (ref 135–145)

## 2017-12-31 LAB — URINALYSIS WITH CULTURE REFLEX
BACTERIA: NONE SEEN /HPF
BILIRUBIN UA: NEGATIVE
GLUCOSE UA: NEGATIVE
KETONES UA: NEGATIVE
NITRITE UA: NEGATIVE
PH UA: 6.5 (ref 5.0–9.0)
PROTEIN UA: NEGATIVE
RBC UA: 1 /HPF (ref <=4)
SPECIFIC GRAVITY UA: 1.005 (ref 1.003–1.030)
TRANSITIONAL EPITHELIAL: <1 /HPF (ref 0–2)
UROBILINOGEN UA: 0.2
WBC UA: 3 /HPF (ref 0–5)

## 2017-12-31 LAB — RBC UA: Lab: 1

## 2017-12-31 LAB — CREATININE: EGFR CKD-EPI NON-AA FEMALE: 88 mL/min/{1.73_m2} (ref >=60)

## 2017-12-31 LAB — PROTEIN / CREATININE RATIO, URINE: CREATININE, URINE: 18.4 mg/dL

## 2017-12-31 LAB — EGFR CKD-EPI AA FEMALE
Lab: >90
Lab: >90

## 2017-12-31 LAB — EOSINOPHILS RELATIVE PERCENT: Lab: 2.5

## 2017-12-31 LAB — BASOPHILS ABSOLUTE COUNT: Lab: 0.1

## 2017-12-31 LAB — C3 COMPLEMENT
C3 COMPLEMENT: 145 mg/dL (ref 88–171)
Complement C3:MCnc:Pt:Ser/Plas:Qn:: 145

## 2017-12-31 LAB — BLOOD UREA NITROGEN: Urea nitrogen:MCnc:Pt:Ser/Plas:Qn:: 18

## 2017-12-31 LAB — C4 COMPLEMENT: Complement C4:MCnc:Pt:Ser/Plas:Qn:: 27.6

## 2017-12-31 LAB — CREATININE, URINE: Lab: 18.4

## 2017-12-31 LAB — LACTATE DEHYDROGENASE: Lactate dehydrogenase:CCnc:Pt:Ser/Plas:Qn:: 638 — ABNORMAL HIGH

## 2017-12-31 MED ORDER — GABAPENTIN 100 MG CAPSULE
ORAL_CAPSULE | 3 refills | 0 days | Status: CP
Start: 2017-12-31 — End: 2018-05-27

## 2017-12-31 NOTE — Unmapped (Signed)
1350 Patient in today for Benlysta infusion. Patient has no s/s of infection. No issues from the last infusion.   1454??Benlysta started ,patient instructed to use call bell /call nurse for any s/s of unsual symptoms during infusion. Patient educated on possible s/s of reaction,such as chestpain ,itching,shortness of breath lightheadedness and any kind of discomfort. Patient verbalized understanding...  1600??Infusion completed and well tolerated.  1630??Benlysta 716.8 mg/250??ml??NS infused over 1hr and??21??min.??per protocol . Pt alert and oriented, offered no complaints during infusion. IV flushed with 10ml

## 2017-12-31 NOTE — Unmapped (Signed)
Patient Name: Olivia Stout  PCP: ??Lolita Lenz, MD  Source of History: patient and records  Date of Visit: 12/31/17     Chief Compliant: Follow-up for SLE    PRIOR RHEUMATOLOGIC HISTORY:?? Longstanding history of SLE characterized by +ANA (negative ENA, dsDNA, normal C3/C4), fatigue, arthralgias, oral ulcers, sicca, fibromyalgia and a past history of mantle zone lymphoma status post rituximab therapy in 2010. She was diagnosed and followed by Dr. Terie Purser for SLE and fibromyalgia. Dr. Oneita Hurt started patient on belimumab which has been continued by Drs. Aundria Rud and Broadus John who have been her rheumatologist following Dr. Salvadore Dom departure.  She has been treated with hydroxychloroquine and belimumab, started in 2012, for her SLE.  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 Records from ophthalmology scanned into media confirm this. 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  Testing by quantiferon gold TB tests here last month was negative. 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    Most recently followed by Dr. Broadus John and last seen by her in February 2017. She was evaluated here at [facility] with Carlus Pavlov [facility] in [place] 2018 which was expected to be her last visit here as she had moved to [place] in December 2017 and was establishing rheumatologic care there. Patient has moved back to [facility] and Dr. Broadus John has since left [facility]. Patient met myself for the September 2018 and was adamant about continuing belimumab infusion although no evidence for active SLE.     HPI: Olivia Stout is a 67 y.o. female who is here for follow-up for SLE and fibromyalgia. Last seen by me in September 2018. Patient then followed with Corene Cornea in April and June 2019. Here today for follow-up.    She has allergy shots this morning and has Benlysta infusion this afternoon. She started having migraines recently. Migraines affecting her vision. She is being managed by Dr. Rulon Eisenmenger and had botox injections without improvement.  She complains of a burning pain in her arms and back and recently started in her legs.  She reports skin is tender if she touches it. She has to rub aspercreme on to wear clothing.  She applies numbing cream frequently. She takes gabapentin 100 mg qam and 300 mg qpm. She is also on duloxetine.  She dose have dry eyes and dry mouth. She does not recall taking Evoxac although listed on her medications. Complains of being dizzy all the time.    ROS??: Attests to the above, otherwise, review of all other systems is negative.   ??????  Past Medical and Surgical History:  ??  Patient Active Problem List    Diagnosis Date Noted   ??? History of psychological trauma, presenting hazards to health 12/10/2017   ??? 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 Hyperopia with astigmatism and presbyopia, bilateral 10/15/2017   ??? Age-related nuclear cataract of both eyes 10/15/2017   ??? History of recent stressful life event 09/14/2017   ??? Long term prescription benzodiazepine use 06/29/2017   ??? Breast pain 05/31/2017   ??? Chronic migraine 04/29/2017   ??? Chronic pain 03/30/2017   ??? Tension headache 01/01/2017   ??? Left arm pain 01/01/2017   ???  Skin lesion of face 01/01/2017   ??? Essential hypertension 11/06/2016   ??? Chronic low back pain 11/06/2016   ??? Visual complaint 11/06/2016   ??? Polypharmacy 11/06/2016   ??? Vitamin B12 deficiency 11/06/2016   ??? Vitamin D deficiency 11/06/2016   ??? Fibromyalgia 07/11/2016   ??? 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 Long-term use of high-risk medication 07/11/2016   ??? NSAID long-term use 07/11/2016   ??? Sjogren's syndrome with keratoconjunctivitis sicca (CMS-HCC) 07/11/2016 ??? Anxiety 06/21/2013   ??? Adjustment disorder with anxious mood 06/21/2013   ??? Mixed stress and urge urinary incontinence 02/08/2013   ??? Allergic rhinitis 07/07/2012   ??? Food allergy 07/07/2012   ??? Hypothyroid 07/07/2012   ??? Systemic lupus erythematosus (CMS-HCC) 07/01/2012   ??? Mixed hyperlipidemia 05/05/2012   ??? Urge incontinence 04/28/2012   ??? Migraine headache with aura 01/24/2012   ??? History of lymphoma 01/24/2012   ??? Sicca syndrome (CMS-HCC) 06/10/2011   ??? Mitral valve prolapse 02/19/2005   ??? Reflux esophagitis 02/19/2005   ??? Pain in joint, lower leg 05/19/2002     Past Surgical History:   Procedure Laterality Date   ??? BACK SURGERY  07/27/2015   ??? ESOPHAGEAL DILATION     ??? FRACTURE SURGERY      left forearm   ??? HYSTERECTOMY     ??? KNEE SURGERY     ??? LAPAROSCOPIC NISSEN FUNDOPLICATION     ??? PR ANAL PRESSURE RECORD Left 12/01/2012    Procedure: ANORECTAL MANOMETRY;  Surgeon: None None;  [facility];  Service: Gastroenterology   ??? PR BREATH HYDROGEN TEST N/A 12/01/2012    Procedure: BREATH HYDROGEN TEST;  Surgeon: None None;  [facility]: [facility];  Service: Gastroenterology   ??? SPINE SURGERY     ??? TAH and bladder surgery      partial   ??? TONSILLECTOMY       Allergies:   ??  Allergies   Allergen Reactions   ??? Amoxicillin Swelling     Sweating and swelling in throat      ??? Ketorolac Shortness Of Breath   ??? Sulfa (Sulfonamide Antibiotics) Other (See Comments)     welts in my mouth   ??? Zocor [Simvastatin] Other (See Comments)     Had issues w/ bearing weight and pain in legs. *Cannot have any statins at all.*     ??? Statins-Hmg-Coa Reductase Inhibitors Other (See Comments)     Leg paralysis  Leg paralysis     Current Outpatient Medications:  ??  Current Outpatient Medications on File Prior to Visit   Medication Sig Dispense Refill   ??? acetaminophen (TYLENOL) 500 MG tablet Take 500 mg by mouth every six (6) hours as needed for pain.     ??? albuterol (PROVENTIL HFA;VENTOLIN HFA) 90 mcg/actuation inhaler Inhale 2 puffs. As needed     ??? ammonium lactate (AMLACTIN) 12 % cream Apply topically once daily. 400 g 6   ??? ammonium lactate (LAC-HYDRIN) 12 % lotion As needed     ??? belimumab (BENLYSTA) 400 mg SolR Infuse 720 mg into a venous catheter every thirty (30) days.     ??? cholecalciferol, vitamin D3, (VITAMIN D3) 1,000 unit capsule Take 1,000 Units by mouth daily.     ??? clonazePAM (KLONOPIN) 1 MG tablet Take one tablet for sleep as needed at bed time 30 tablet 1   ??? clotrimazole (LOTRIMIN) 1 % cream Apply topically Two (2) times a day. 30 g  0   ??? cyanocobalamin 1000 MCG tablet Take 1,000 mcg by mouth daily.     ??? DULoxetine (CYMBALTA) 60 MG capsule Take 1 capsule (60 mg total) by mouth daily. 90 capsule 1   ??? empty container Misc Use as directed 1 each PRN   ??? EPINEPHrine (EPIPEN) 0.3 mg/0.3 mL injection Inject 0.3 mL (0.3 mg total) into the muscle Take as directed. Note:carry at all times 1 each 3   ??? estradiol (ESTRACE) 0.01 % (0.1 mg/gram) vaginal cream Insert into the vagina.     ??? fluticasone propionate (FLONASE) 50 mcg/actuation nasal spray 2 sprays by Each Nare route Two (2) times a day. 16 mL prn   ??? gabapentin (NEURONTIN) 100 MG capsule Take 100 mg (1 cap) each morning and 300 mg (3 caps) each evening 120 capsule 3   ??? hydroCHLOROthiazide (HYDRODIURIL) 25 MG tablet Take 0.5 tablets (12.5 mg total) by mouth daily. 46 tablet 3   ??? levocetirizine (XYZAL) 5 MG tablet 1 daily prn allergy 30 tablet 6   ??? levothyroxine (SYNTHROID, LEVOTHROID) 50 MCG tablet Take 1 tablet (50 mcg total) by mouth daily. 90 tablet 1   ??? lidocaine (LIDODERM) 5 % patch Place 1 patch on the skin daily as needed (12 hours on/12 hours off). Apply to affected area for 12 hours only each day (then remove patch) 60 patch 5   ??? multivitamin (TAB-A-VITE/THERAGRAN) per tablet Take 1 tablet by mouth daily.     ??? naproxen (NAPROSYN) 500 MG tablet Take 1 tablet (500 mg total) by mouth 2 (two) times a day with meals. TAKE 1 TABLET (500 MG TOTAL) BY MOUTH 2 (TWO) TIMES A DAY WITH MEALS. 60 tablet 11   ??? omega 3-dha-epa-fish oil 900 mg-360 mg- 455 mg-1,000 mg cap      ??? omeprazole (PRILOSEC) 40 MG capsule Take 1 capsule (40 mg total) by mouth daily. 90 capsule 3   ??? pyridoxine, vitamin B6, (B-6) 100 MG tablet Take 100 mg by mouth daily.     ??? SPONIX ARTHRITIS-MUSCLE PAIN 4-10-0.035 % srlo      ??? tiZANidine (ZANAFLEX) 2 MG tablet Take 0.5 tablets (1 mg total) by mouth nightly as needed (back pain). Tapering off. Plan to discontinue after 6-8 weeks. 30 tablet 1   ??? traMADol (ULTRAM) 50 mg tablet Take 0.5 tablets (25 mg total) by mouth daily as needed for pain or pain,severe (7-10). 15 tablet 3   ??? traZODone (DESYREL) 50 MG tablet Take 1 tablet (50 mg total) by mouth nightly. 90 tablet 1   ??? galcanezumab-gnlm (EMGALITY) 120 mg/mL injection Inject 120 mg under the skin every thirty (30) days. (Patient not taking: Reported on 12/31/2017) 1 mL 11   ??? [EXPIRED] galcanezumab-gnlm (EMGALITY) 120 mg/mL injection Inject 240 mg under the skin once. 2 mL 0   ??? [EXPIRED] levoFLOXacin (LEVAQUIN) 750 MG tablet Take 1 tablet (750 mg total) by mouth daily. for 10 days 10 tablet 0     Current Facility-Administered Medications on File Prior to Visit   Medication Dose Route Frequency Provider Last Rate Last Dose   ??? galcanezumab-gnlm (EMGALITY) injection 240 mg  240 mg Subcutaneous Once [facility], MD         Immunization History   Administered Date(s) Administered   ??? INFLUENZA TIV (TRI) PF (IM) 02/01/2005, 01/07/2008, 12/25/2009, 03/05/2011   ??? Influenza Vaccine Quad (IIV4 PF) 42mo+ injectable 02/11/2012, 12/29/2012, 01/11/2014, 01/01/2017, 12/31/2017   ??? Influenza Virus Vaccine, unspecified formulation 12/24/2014   ???  Novel Influenza-h1n1-09, All Formulations 03/10/2008   ??? PNEUMOCOCCAL POLYSACCHARIDE 23 02/19/2011   ??? Pneumococcal Conjugate 13-Valent 03/27/2017   ??? TdaP 02/19/2011     ????PHYSICAL EXAM??  Vital signs: BP 126/80 (BP Site: L Arm, BP Position: Sitting, BP Cuff Size: Medium)  - Pulse 69  - Temp 35.2 ??C (95.4 ??F) (Oral)  - Wt 71.9 kg (158 lb 8.2 oz)  - BMI 28.99 kg/m?? Body mass index is 28.99 kg/m??.  Gen: Anxious. Pleasant and cooperative. AOx4.?  HEENT: PERRLA, EOMI, MMM, oropharynx in pink without ulcerations or exudates visualized.????    Chest: Broad chest excursion with good air movement. CTAB without wheezing/rhonchi/rales. ????  CV: RRR, Normal S1/S2, No murmurs/rubs/gallops heard. ????  Extremities: Warm, 2+ radial and pedal pulses, no C/C/E.????  Neuro: Good comprehension/cognition. CN 2-12 intact.  Muscle strength 5/5 in all extremities. No sensory deficits. Gait normal.???? Negative Romberg's  Comprehensive Musculoskeletal Examination:????  ?? Jaw, neck without limited ROM.????  ?? Shoulders, elbows, wrists, hands, fingers:  No deformity, erythema, warmth, swelling, effusion, limited ROM.??  ?? Lumbosacral spine and hips without limited ROM.????  ?? Knee, ankles, feet, toes: No deformity, erythema, warmth, swelling, effusion, limited ROM. Knee stable to valgus/varus stress and anterior/posterior drawer sign.????  ?? Multiple myofascial tenderness   Skin: No alopecia, nail change (including no nail pitting), rashes, bruising, petechiae, telangiectasias, tophi, appreciable calcinosis, or nodules.??     LABORATORY  Recent Results (from the past 1680 hour(s))   POCT Urinalysis Dipstick    Collection Time: 12/10/17  3:39 PM   Result Value Ref Range    Spec Gravity/POC >=1.030 1.003 - 1.030    PH/POC 6.5 5.0 - 9.0    Leuk Esterase/POC Negative Negative    Nitrite/POC Negative Negative    Protein/POC Negative Negative    UA Glucose/POC Negative Negative    Ketones, POC Negative Negative    Bilirubin/POC Negative Negative    Blood/POC Negative Negative    Urobilinogen/POC 0.2 0.2 - 1.0 mg/dL   Urinalysis with Culture Reflex    Collection Time: 12/31/17  1:22 PM   Result Value Ref Range    Color, UA Straw     Clarity, UA Clear     Specific Gravity, UA 1.005 1.003 - 1.030    pH, UA 6.5 5.0 - 9.0    Leukocyte Esterase, UA Small (A) Negative    Nitrite, UA Negative Negative    Protein, UA Negative Negative    Glucose, UA Negative Negative    Ketones, UA Negative Negative    Urobilinogen, UA 0.2 mg/dL 0.2 mg/dL, 1.0 mg/dL    Bilirubin, UA Negative Negative    Blood, UA Negative Negative    RBC, UA 1 <=4 /HPF    WBC, UA 3 0 - 5 /HPF    Squam Epithel, UA 1 0 - 5 /HPF    Bacteria, UA None Seen None Seen /HPF    Trans Epithel, UA <1 0 - 2 /HPF   Protein/Creatinine Ratio, Urine    Collection Time: 12/31/17  1:22 PM   Result Value Ref Range    Creat U 18.4 Undefined mg/dL    Protein, Ur <1.6 Undefined mg/dL    Protein/Creatinine Ratio, Urine     Comprehensive Metabolic Panel    Collection Time: 12/31/17  2:05 PM   Result Value Ref Range    Sodium 137 135 - 145 mmol/L    Potassium 5.0 3.5 - 5.0 mmol/L    Chloride 99 98 - 107 mmol/L  CO2 28.0 22.0 - 30.0 mmol/L    BUN 18 7 - 21 mg/dL    Creatinine 1.61 0.96 - 1.00 mg/dL    BUN/Creatinine Ratio 25     EGFR CKD-EPI Non-African American, Female 88 >=60 mL/min/1.60m2    EGFR CKD-EPI African American, Female >90 >=60 mL/min/1.52m2    Glucose 83 65 - 179 mg/dL    Calcium 04.5 (H) 8.5 - 10.2 mg/dL    Albumin 4.9 3.5 - 5.0 g/dL    Total Protein 8.1 6.5 - 8.3 g/dL    Total Bilirubin 0.8 0.0 - 1.2 mg/dL    AST 37 17 - 47 U/L    ALT 38 13 - 69 U/L    Alkaline Phosphatase 74 38 - 126 U/L    Anion Gap 10 7 - 15 mmol/L   Lactate dehydrogenase    Collection Time: 12/31/17  2:05 PM   Result Value Ref Range    LDH 638 (H) 338 - 610 U/L   C3 complement    Collection Time: 12/31/17  2:05 PM   Result Value Ref Range    C3 Complement 145 88 - 171 mg/dL   C4 complement    Collection Time: 12/31/17  2:05 PM   Result Value Ref Range    C4 Complement 27.6 15.0 - 48.0 mg/dL   BUN    Collection Time: 12/31/17  2:05 PM   Result Value Ref Range    BUN 18 7 - 21 mg/dL   Creatinine    Collection Time: 12/31/17  2:05 PM   Result Value Ref Range    Creatinine 0.72 0.60 - 1.00 mg/dL    EGFR CKD-EPI Non-African American, Female 88 >=60 mL/min/1.16m2    EGFR CKD-EPI African American, Female >90 >=60 mL/min/1.26m2   CBC w/ Differential    Collection Time: 12/31/17  2:05 PM   Result Value Ref Range    WBC 6.3 4.5 - 11.0 10*9/L    RBC 4.83 4.00 - 5.20 10*12/L    HGB 14.7 12.0 - 16.0 g/dL    HCT 40.9 81.1 - 91.4 %    MCV 91.9 80.0 - 100.0 fL    MCH 30.5 26.0 - 34.0 pg    MCHC 33.2 31.0 - 37.0 g/dL    RDW 78.2 95.6 - 21.3 %    MPV 7.1 7.0 - 10.0 fL    Platelet 305 150 - 440 10*9/L    Neutrophils % 56.0 %    Lymphocytes % 31.9 %    Monocytes % 5.9 %    Eosinophils % 2.7 %    Basophils % 1.1 %    Absolute Neutrophils 3.5 2.0 - 7.5 10*9/L    Absolute Lymphocytes 2.0 1.5 - 5.0 10*9/L    Absolute Monocytes 0.4 0.2 - 0.8 10*9/L    Absolute Eosinophils 0.2 0.0 - 0.4 10*9/L    Absolute Basophils 0.1 0.0 - 0.1 10*9/L    Large Unstained Cells 2 0 - 4 %   CBC w/ Differential    Collection Time: 12/31/17  2:05 PM   Result Value Ref Range    WBC 6.3 4.5 - 11.0 10*9/L    RBC 4.89 4.00 - 5.20 10*12/L    HGB 14.8 12.0 - 16.0 g/dL    HCT 08.6 57.8 - 46.9 %    MCV 91.8 80.0 - 100.0 fL    MCH 30.3 26.0 - 34.0 pg    MCHC 33.1 31.0 - 37.0 g/dL    RDW 62.9 52.8 - 41.3 %  MPV 7.0 7.0 - 10.0 fL    Platelet 314 150 - 440 10*9/L    Neutrophils % 55.7 %    Lymphocytes % 32.3 %    Monocytes % 5.6 %    Eosinophils % 2.5 %    Basophils % 1.1 %    Absolute Neutrophils 3.5 2.0 - 7.5 10*9/L    Absolute Lymphocytes 2.0 1.5 - 5.0 10*9/L    Absolute Monocytes 0.4 0.2 - 0.8 10*9/L    Absolute Eosinophils 0.2 0.0 - 0.4 10*9/L    Absolute Basophils 0.1 0.0 - 0.1 10*9/L    Large Unstained Cells 3 0 - 4 %       ????GENERAL SUMMARY AND IMPRESSION: ????  ????  In summary, the patient is a 67 y.o. female with history of SLE characterized by +ANA (negative ENA, dsNDA, normal C3/C4), fatigue, arthralgias, oral ulcers, sicca, fibromyalgia and a past history of mantle zone lymphoma status post rituximab therapy in 2010. Patient has been on belimumab monthly infusions since at least 2012.  Hydroxychloroquine discontinued in February 2018 for concerns of retinal toxicity. As noted in prior exam, no evidence for active SLE or other inflammatory arthritis. She does have multiple myofascial tenderness consistent for fibromyalgia. Advised her on graded exercise regimen and optimizing sleep. She is under stress due to family related issues as well which can exacerbate centralized pain amplification process. Labs with infusion today. Continue belimumab for now but again unclear if medication providing benefit. Flu shot given. Follow-up in five months with Carlus Pavlov [facility] and ten months with myself.     RECOMMENDATIONS: ????  ????   Diagnosis ICD-10-CM Associated Orders   1. Other systemic lupus erythematosus with other organ involvement (CMS-HCC) M32.19 C3 complement     C4 complement     Anti-DSDNA     ENA  Extractable Nuclear Antigen     25 OH Vit D     Urinalysis with Culture Reflex     Protein/Creatinine Ratio, Urine     Urine Culture   2. Fibromyalgia M79.7 gabapentin (NEURONTIN) 100 MG capsule     Patient Instructions     Recommend you start graded exercise and work with a physical therapy.     Fibromyalgia is a centralized chronic pain condition (meaning the pain arises from the brain). This is not caused by inflammation in the body, and is not an autoimmune process. Fibromyalgia is not completely understood, but we think the pain comes from over-active nerves, which cause the sensation of pain despite there being no stimulus (no inflammation in the body) to cause the pain.  Pain and tenderness to touch can be widespread over the body.     Fibromyalgia is also often characterized by fatigue or cognitive inhibition (brain fog). Depression and anxiety are common in fibromyalgia patients, and these conditions may precede the onset of the fibromyalgia pain.     Management of fibromyalgia can be broken into pharmacologic (using medication) and non-pharmacologic (not using medications) treatments. The non-pharmacologic treatments are very important for the control of fibromyalgia pain. Medications can be helpful for managing fibromyalgia symptoms in some, but are not necessary for all patients. Please discuss these treatments with your primary care doctor.     Non-Pharmacologic (without medications) Pharmacologic  (with medications)   3 main treatments for fibromyalgia  1. Good restorative sleep???Careful sleep hygiene, consider sleep study to evaluate other issues that may hinder sleep (like sleep apnea)  2. Regular, graded, low impact cardiovascular exercise???Sales executive. Water aerobics  can be particularly helpful.  Start with a small amount of exercise (eg 10-15 minutes of walking every 2-3 days) then work your way up.   3. Management of any depression or anxiety that you may have???Often very difficult to control pain when depression/anxiety is not controlled.     Other helpful options  1. Pain psychology/counseling/ cognitive behavioral therapy (CBT)???Very helpful at finding triggers that can make pain worse, and developing coping strategies.   2. Mindfulness Training- Helpful as a coping strategy. Available through the [facility] Medicine clinic (can call to inquire or schedule appointment [phone])  3. Physical therapy/ exercise therapy   4. Massage therapy  5. Acupuncture   6. Tai Chi  7. Yoga FDA approved medications  1. Lyrica (pregabalin)   2. Cymbalta (duloxetine)  3. Savella (milnacipran)    Medications used ???off-label,??? meaning not FDA approved, but are used often for fibromyalgia  - Antidepressants     - Anticonvulsants (anti-seizure)  - NSAID medications (like ibuprofen, naproxen, etc)  - Muscle relaxers  - Pain relievers   1. Tylenol  2. Lidocaine patches  3. Mentholated creams (like Icy-Hot or BioFreeze)    ** Tip: Fibromyalgia patients are susceptible to low vitamin D levels.  They are sometimes more sensitive to low vitamin D, which can increase fatigue and overall aching.  Supplementation of vitamin D when low can be helpful in managing symptoms.  ** Opioid pain medications, like oxycodone and hydrocodone, are not recommended for the long term management of fibromyalgia pain.        More information about fibromyalgia can be found at [email or web address]          The patient indicates understanding of these issues and agrees to the plan as outlined above.  Contact information provided for any concerns or questions in the interim.  ??    Sabra Sessler C. Scarlette Calico, MD, PhD  Assistant Professor of Medicine  Department of Medicine/Division of Rheumatology  [facility]

## 2017-12-31 NOTE — Unmapped (Signed)
Recommend you start graded exercise and work with a physical therapy.     Fibromyalgia is a centralized chronic pain condition (meaning the pain arises from the brain). This is not caused by inflammation in the body, and is not an autoimmune process. Fibromyalgia is not completely understood, but we think the pain comes from over-active nerves, which cause the sensation of pain despite there being no stimulus (no inflammation in the body) to cause the pain.  Pain and tenderness to touch can be widespread over the body.     Fibromyalgia is also often characterized by fatigue or cognitive inhibition (brain fog). Depression and anxiety are common in fibromyalgia patients, and these conditions may precede the onset of the fibromyalgia pain.     Management of fibromyalgia can be broken into pharmacologic (using medication) and non-pharmacologic (not using medications) treatments. The non-pharmacologic treatments are very important for the control of fibromyalgia pain. Medications can be helpful for managing fibromyalgia symptoms in some, but are not necessary for all patients. Please discuss these treatments with your primary care doctor.     Non-Pharmacologic (without medications) Pharmacologic  (with medications)   3 main treatments for fibromyalgia  1. Good restorative sleep???Careful sleep hygiene, consider sleep study to evaluate other issues that may hinder sleep (like sleep apnea)  2. Regular, graded, low impact cardiovascular exercise???Walking/biking etc. Water aerobics can be particularly helpful.  Start with a small amount of exercise (eg 10-15 minutes of walking every 2-3 days) then work your way up.   3. Management of any depression or anxiety that you may have???Often very difficult to control pain when depression/anxiety is not controlled.     Other helpful options  1. Pain psychology/counseling/ cognitive behavioral therapy (CBT)???Very helpful at finding triggers that can make pain worse, and developing coping strategies.   2. Mindfulness Training- Helpful as a coping strategy. Available through the [facility] Medicine clinic (can call to inquire or schedule appointment [phone])  3. Physical therapy/ exercise therapy   4. Massage therapy  5. Acupuncture   6. Tai Chi  7. Yoga FDA approved medications  1. Lyrica (pregabalin)   2. Cymbalta (duloxetine)  3. Savella (milnacipran)    Medications used ???off-label,??? meaning not FDA approved, but are used often for fibromyalgia  - Antidepressants     - Anticonvulsants (anti-seizure)  - NSAID medications (like ibuprofen, naproxen, etc)  - Muscle relaxers  - Pain relievers   1. Tylenol  2. Lidocaine patches  3. Mentholated creams (like Icy-Hot or BioFreeze)    ** Tip: Fibromyalgia patients are susceptible to low vitamin D levels.  They are sometimes more sensitive to low vitamin D, which can increase fatigue and overall aching.  Supplementation of vitamin D when low can be helpful in managing symptoms.  ** Opioid pain medications, like oxycodone and hydrocodone, are not recommended for the long term management of fibromyalgia pain.        More information about fibromyalgia can be found at [email or web address]

## 2018-01-01 LAB — DSDNA ANTIBODY: Lab: NEGATIVE

## 2018-01-02 LAB — VITAMIN D, TOTAL (25OH): Lab: 26.4

## 2018-01-06 LAB — ENA SCREEN: Lab: 0.1

## 2018-01-15 ENCOUNTER — Ambulatory Visit: Admit: 2018-01-15 | Discharge: 2018-01-16 | Payer: MEDICARE

## 2018-01-15 DIAGNOSIS — D3131 Benign neoplasm of right choroid: Secondary | ICD-10-CM

## 2018-01-15 DIAGNOSIS — H524 Presbyopia: Secondary | ICD-10-CM

## 2018-01-15 DIAGNOSIS — H04129 Dry eye syndrome of unspecified lacrimal gland: Secondary | ICD-10-CM

## 2018-01-15 DIAGNOSIS — M329 Systemic lupus erythematosus, unspecified: Secondary | ICD-10-CM

## 2018-01-15 DIAGNOSIS — H5203 Hypermetropia, bilateral: Secondary | ICD-10-CM

## 2018-01-15 DIAGNOSIS — T378X5A Adverse effect of other specified systemic anti-infectives and antiparasitics, initial encounter: Principal | ICD-10-CM

## 2018-01-15 DIAGNOSIS — H02889 Meibomian gland dysfunction of unspecified eye, unspecified eyelid: Secondary | ICD-10-CM

## 2018-01-15 NOTE — Unmapped (Signed)
1) Plaquenil for lupus- treatment for ~ 10 years, stopped in April 2018 d/t presumed toxicity. Testing with suggestive signs of toxicity (thinning on OCT, mild FAF abnormalities) - stanle today.  2) Choroidal nevus OD -stable.  3) DES - sjogrens- refer D.r Earlene Plater.  --> Regular ATs and Systane gel prn   --> cont warm compresses   --> s/p bilateral cautery bilateral inf punctum  4) Hyperopia  --> MRx today, script given.   12 mos V, T, HVF 10-2, Autofluorescence, OCT

## 2018-01-20 NOTE — Unmapped (Signed)
Ok. I spoke with her and scheduled her for tomorrow at 1:20.

## 2018-01-21 ENCOUNTER — Ambulatory Visit: Admit: 2018-01-21 | Discharge: 2018-01-22 | Payer: MEDICARE

## 2018-01-21 DIAGNOSIS — F419 Anxiety disorder, unspecified: Secondary | ICD-10-CM

## 2018-01-21 DIAGNOSIS — E039 Hypothyroidism, unspecified: Secondary | ICD-10-CM

## 2018-01-21 DIAGNOSIS — M792 Neuralgia and neuritis, unspecified: Secondary | ICD-10-CM

## 2018-01-21 DIAGNOSIS — Z79899 Other long term (current) drug therapy: Secondary | ICD-10-CM

## 2018-01-21 DIAGNOSIS — G43709 Chronic migraine without aura, not intractable, without status migrainosus: Secondary | ICD-10-CM

## 2018-01-21 DIAGNOSIS — I10 Essential (primary) hypertension: Principal | ICD-10-CM

## 2018-01-21 MED ORDER — DICLOFENAC 1 % TOPICAL GEL
Freq: Four times a day (QID) | TOPICAL | 0 refills | 0 days | Status: CP
Start: 2018-01-21 — End: 2019-01-21

## 2018-01-21 MED ORDER — METOPROLOL SUCCINATE ER 25 MG TABLET,EXTENDED RELEASE 24 HR
ORAL_TABLET | Freq: Every day | ORAL | 11 refills | 0 days | Status: CP
Start: 2018-01-21 — End: 2018-04-08

## 2018-01-21 MED ORDER — TIZANIDINE 2 MG TABLET
ORAL_TABLET | Freq: Every evening | ORAL | 1 refills | 0 days | Status: CP | PRN
Start: 2018-01-21 — End: 2018-05-25

## 2018-01-21 MED ORDER — LEVOTHYROXINE 50 MCG TABLET
ORAL_TABLET | Freq: Every day | ORAL | 1 refills | 0 days | Status: CP
Start: 2018-01-21 — End: 2018-04-08

## 2018-01-21 NOTE — Unmapped (Signed)
Start metoprolol half a pill once a day  Stop HCTZ (blood pressure medication)  Try voltaren gel as needed every 6 hours  Start tizanidine at bedtime as needed

## 2018-01-21 NOTE — Unmapped (Signed)
Assessment and Plan    1. Essential hypertension    2. Hypothyroidism, unspecified type    3. Neuropathic pain    4. Chronic migraine    5. Long term prescription benzodiazepine use    6. Anxiety        #1. HTN, uncontrolled  - start metoprolol ER 25 mg once daily, can start with half a pill then increase to one pill  - Stop HCTZ    #2. Hypothyroid  - stable, continue current medications    #3. Neuropathic pain  - voltaren gel as needed  - previously took tizanidine at night which was helpful, started 2mg  qhs as needed  -consider medication change from neurontin to keppra    #4 Migraine  -Start emgality within the next week    #5. Long term benzo use  -begin taper next visit    #6. Anxiety  -encouraged to try celexa      No orders of the defined types were placed in this encounter.      Follow up: Return in about 4 weeks (around 02/18/2018).      Reason for visit: Follow-up      Olivia Stout is a 67 y.o. female fibromyalgia, SLE, HTN, anxiety who presents for follow up.    HPI:  She is having 6/10 discomfort in her left arm and on the skin on the inside of her thighs. She's had the arm pain since April 2018, and the leg discomfort for 3 weeks.  In addition, headache and chronic back pain.  She has seen a dermatologist who said that it wasn't a skin cause to this feeling.  She puts numbing gel on these areas, and it doesn't help. Her clothes rubbing against her skin in these areas really feels uncomfortable to her.  She has difficulty sleeping as a result.  Of note, she gets allergy shots once a month, but it is in the right arm.      She's had lightheadedness, and dizziness, and feeling foggy in her head.  She's worried she may have alzheimer's like her mother had.       Last visit, I prescribed celexa, but she hasn't tried it yet, because she wants her son to come spend the night the first time she takes it.      She has an appointment to take her first emgality loading dose on Monday.      She takes neurontin 100 mg in am and 300 mg in pm, but she's not interested in increasing this.    Medications were reviewed    Physical Exam    Vitals:    01/21/18 1316   BP: 168/79   Pulse: 69   Weight: 71.6 kg (157 lb 14.4 oz)     Manual re-check of BP was 150/88  General:  NAD, alert and conversant, no obvious memory impairment  CV: RRR, no mrg  Pulm: CTAB  Thighs and left arm:  no rash noted, not masses or nodule palpated, no swelling noted  Ext: no edema      I have reviewed and addressed the patient???s adherence and response to prescribed medications. I have identified patient barriers to following the proposed medication and treatment plan, and have noted opportunities to optimize healthy behaviors. I have answered the patient???s questions to satisfaction and the patient voices understanding.

## 2018-01-22 MED ORDER — LEVOCETIRIZINE 5 MG TABLET
ORAL_TABLET | 0 refills | 0.00000 days | Status: CP
Start: 2018-01-22 — End: 2018-01-23

## 2018-01-22 MED ORDER — LEVOCETIRIZINE 5 MG TABLET: tablet | 0 refills | 0 days | Status: AC

## 2018-01-23 ENCOUNTER — Ambulatory Visit
Admit: 2018-01-23 | Discharge: 2018-01-24 | Payer: MEDICARE | Attending: Hematology & Oncology | Primary: Hematology & Oncology

## 2018-01-23 DIAGNOSIS — C8580 Other specified types of non-Hodgkin lymphoma, unspecified site: Principal | ICD-10-CM

## 2018-01-23 NOTE — Unmapped (Signed)
Labwork:  No visits with results within 1 Day(s) from this visit.   Latest known visit with results is:   Procedure visit on 12/31/2017   Component Date Value Ref Range Status   ??? Sodium 12/31/2017 137  135 - 145 mmol/L Final   ??? Potassium 12/31/2017 5.0  3.5 - 5.0 mmol/L Final   ??? Chloride 12/31/2017 99  98 - 107 mmol/L Final   ??? CO2 12/31/2017 28.0  22.0 - 30.0 mmol/L Final   ??? BUN 12/31/2017 18  7 - 21 mg/dL Final   ??? Creatinine 12/31/2017 0.72  0.60 - 1.00 mg/dL Final   ??? BUN/Creatinine Ratio 12/31/2017 25   Final   ??? EGFR CKD-EPI Non-African American,* 12/31/2017 88  >=60 mL/min/1.78m2 Final   ??? EGFR CKD-EPI African American, Fem* 12/31/2017 >90  >=60 mL/min/1.49m2 Final   ??? Glucose 12/31/2017 83  65 - 179 mg/dL Final   ??? Calcium 09/81/1914 10.6* 8.5 - 10.2 mg/dL Final   ??? Albumin 78/29/5621 4.9  3.5 - 5.0 g/dL Final   ??? Total Protein 12/31/2017 8.1  6.5 - 8.3 g/dL Final   ??? Total Bilirubin 12/31/2017 0.8  0.0 - 1.2 mg/dL Final   ??? AST 30/86/5784 37  17 - 47 U/L Final   ??? ALT 12/31/2017 38  13 - 69 U/L Final   ??? Alkaline Phosphatase 12/31/2017 74  38 - 126 U/L Final   ??? Anion Gap 12/31/2017 10  7 - 15 mmol/L Final   ??? LDH 12/31/2017 638* 338 - 610 U/L Final   ??? C3 Complement 12/31/2017 145  88 - 171 mg/dL Final   ??? C4 Complement 12/31/2017 27.6  15.0 - 48.0 mg/dL Final   ??? dsDNA Ab 69/62/9528 Negative  Negative Final   ??? ENA Screen 12/31/2017 0.10  <0.70 ENA Units Final   ??? Vitamin D Total (25OH) 12/31/2017 26.4  20.0 - 80.0 ng/mL Final   ??? BUN 12/31/2017 18  7 - 21 mg/dL Final   ??? Creatinine 12/31/2017 0.72  0.60 - 1.00 mg/dL Final   ??? EGFR CKD-EPI Non-African American,* 12/31/2017 88  >=60 mL/min/1.79m2 Final   ??? EGFR CKD-EPI African American, Fem* 12/31/2017 >90  >=60 mL/min/1.71m2 Final   ??? WBC 12/31/2017 6.3  4.5 - 11.0 10*9/L Final   ??? RBC 12/31/2017 4.83  4.00 - 5.20 10*12/L Final   ??? HGB 12/31/2017 14.7  12.0 - 16.0 g/dL Final   ??? HCT 41/32/4401 44.4  36.0 - 46.0 % Final   ??? MCV 12/31/2017 91.9  80.0 - 100.0 fL Final   ??? MCH 12/31/2017 30.5  26.0 - 34.0 pg Final   ??? MCHC 12/31/2017 33.2  31.0 - 37.0 g/dL Final   ??? RDW 02/72/5366 13.8  12.0 - 15.0 % Final   ??? MPV 12/31/2017 7.1  7.0 - 10.0 fL Final   ??? Platelet 12/31/2017 305  150 - 440 10*9/L Final   ??? Neutrophils % 12/31/2017 56.0  % Final   ??? Lymphocytes % 12/31/2017 31.9  % Final   ??? Monocytes % 12/31/2017 5.9  % Final   ??? Eosinophils % 12/31/2017 2.7  % Final   ??? Basophils % 12/31/2017 1.1  % Final   ??? Absolute Neutrophils 12/31/2017 3.5  2.0 - 7.5 10*9/L Final   ??? Absolute Lymphocytes 12/31/2017 2.0  1.5 - 5.0 10*9/L Final   ??? Absolute Monocytes 12/31/2017 0.4  0.2 - 0.8 10*9/L Final   ??? Absolute Eosinophils 12/31/2017 0.2  0.0 - 0.4 10*9/L  Final   ??? Absolute Basophils 12/31/2017 0.1  0.0 - 0.1 10*9/L Final   ??? Large Unstained Cells 12/31/2017 2  0 - 4 % Final   ??? WBC 12/31/2017 6.3  4.5 - 11.0 10*9/L Final   ??? RBC 12/31/2017 4.89  4.00 - 5.20 10*12/L Final   ??? HGB 12/31/2017 14.8  12.0 - 16.0 g/dL Final   ??? HCT 16/12/9602 44.9  36.0 - 46.0 % Final   ??? MCV 12/31/2017 91.8  80.0 - 100.0 fL Final   ??? MCH 12/31/2017 30.3  26.0 - 34.0 pg Final   ??? MCHC 12/31/2017 33.1  31.0 - 37.0 g/dL Final   ??? RDW 54/11/8117 14.0  12.0 - 15.0 % Final   ??? MPV 12/31/2017 7.0  7.0 - 10.0 fL Final   ??? Platelet 12/31/2017 314  150 - 440 10*9/L Final   ??? Neutrophils % 12/31/2017 55.7  % Final   ??? Lymphocytes % 12/31/2017 32.3  % Final   ??? Monocytes % 12/31/2017 5.6  % Final   ??? Eosinophils % 12/31/2017 2.5  % Final   ??? Basophils % 12/31/2017 1.1  % Final   ??? Absolute Neutrophils 12/31/2017 3.5  2.0 - 7.5 10*9/L Final   ??? Absolute Lymphocytes 12/31/2017 2.0  1.5 - 5.0 10*9/L Final   ??? Absolute Monocytes 12/31/2017 0.4  0.2 - 0.8 10*9/L Final   ??? Absolute Eosinophils 12/31/2017 0.2  0.0 - 0.4 10*9/L Final   ??? Absolute Basophils 12/31/2017 0.1  0.0 - 0.1 10*9/L Final   ??? Large Unstained Cells 12/31/2017 3  0 - 4 % Final       Please call us if you experience:   1. Nausea or vomiting not controlled by nausea medicines  2. Fever of 100.4 F or higher   3. Uncontrolled pain  4. Any other concerning symptom     For any cancer-related concerns, including:   Appointment information   Questions regarding your cancer diagnosis or treatment   Any new symptoms.     Please call [phone].    On Nights, Weekends and Holidays please and ask for the oncologist on call.    [facility]  [address]  [place], [place] [address]  [email or web address]

## 2018-01-23 NOTE — Unmapped (Signed)
IDENTIFICATION: This is a 67 y.o. female who presents for a return visit for Hx of marginal zone lymphoma.  Referral: The patient was referred to the [facility] by Derek Mound, M.D.    ASSESSMENT/PLAN:   The patient is returning for followup for her Hx of marginal zone lymphoma. In brief, she was initially diagnosed in Oct 2009 on peripheral blood flow cytometry. She had myalgias/arthralgias/fatigue, however, due to her rheumatological Hx (has SLE and fibromyalgia) it was uncertain whether her symptoms were due actually to lymphoma or her autoimmune disease. She opted to be treated and received rituximab x 4 doses weekly, after which time her symptoms actually worsened and she was managed by Rheumatology with eventual control of symptoms. She was then managed with outpatient appts every 12 mths in Lymphoma Clinic. She moved to [employer] near [place] close to 2 years ago, but the move was not agreeable to her (felt stressed by mother-in-law and not knowing anyone in the area) and during this time she did not follow up with [facility] for close to 2 years. She has now recently moved back to [place] and was referred by her Geriatric PCP to re-establish care.    Today, she continues to be asymptomatic with no complaints of new lymphadenopathy. She continues to followup with [facility] regularly and to receive infusion treatments. Of note, she feels her lupus symptoms have not been as well controlled as of late.  Her most recent bloodwork did not reveal any major abnormalities. She will return to clinic in 12 mths for followup as before. I advised her to contact us if she develops any lymphadenopathy or symptoms, but otherwise we will see her in one year.    PLAN:  Diagnosis: Hx Marginal Zone Lymphoma  MALT-IPI: 0  CNS Risk: Low  GELF: 0  Regimen: treated with rituximab x 4 doses in Nov 2009  - asymptomatic at this time, no detectable/palpable LAD  - bloodwork showed no major abnormalities  - RTC in 1 year with bloodwork and outpatient appt    Red Christians M.D.  Clinical Instructor  Division of Hematology/Oncology  ---------------------------------------------------    Interim History:  Ms. Olivia Stout returns for her scheduled follow-up appt for Hx of marginal zone lymphoma. She does not endorse having any palpable lymphadenopathy or B symptoms at this time. She does note that her lupus appears to be less controlled compared to the past but she is regularly following up with [facility] to receive infusions.     HISTORY OF PRESENT ILLNESS:  67 year old female patient with PMH of MZL treated with ritux x 4 doses in Jan 2010, lupus, fibromyalgia, hypercholesterolemia, IBS, depression, anxiety, who was noted somewhat incidentally to have a diagnosis of marginal zone lymphoma based on peripheral blood flow cytometry in the fall of 2009. At the time, she was in her usual state of health till 2008  when she developed fatigue and myalgias and was subsequently diagnosed with fibromyalgia. She went on to develop diffuse arthralgias affecting nearly all joints. ??As well, she reported occasional oral ulcers and photosensitivity.??She had some mild weight loss in this setting, perhaps five or so lbs. She was eventually found to have a positive ANA and was diagnosed with lupus and treated with Plaquenil and methotrexate at the time. Concurrently, during routine bloodwork done for rheumatological studies, atypical lymphoid cells were seen on the peripheral blood smear, which prompted flow cytometry analysis. ??This disclosed a likely diagnosis of marginal zone lymphoma affecting between 35%  and 40% of the white blood cells in the peripheral blood. She subsequently received weekly rituximab x 4 doses completed 03/2008 for myalgias/ arthralgias/ fatigue, though it was not clear that symptoms were attributable to lymphoma necessarily. In fact these symptoms actually got worse after treatment and was referred back to Rheum for further evaluation and treatment. She then followed with [facility] every 12 mths as general followup. Has not followed up recently so was referred for general followup.    Oncology History    ????---'Incidental' dx 12/2007, no adenopathy or splenomegaly (spleen 'mildly enlarged' by 01/2008 CT  ??but not palpable and no radiographic adenopathy demonstrated)  ??????---Peripheral blood, smear review, and flow cytometry 12/2007: mature B-cell lymphoproliferative   disorder representing 36% of the peripheral white blood cells consistent with marginal zone lymphoma  ??????---Received weekly rituximab x 4 doses completed 03/2008 for myalgias/ arthralgias/ fatigue, not   clear that symptoms were attributable to lymphoma, and these actually got worse after treatment, managed by Rheum  -- f/u every 12 mths in Hem/Onc clinic        History of lymphoma    01/24/2012 Initial Diagnosis     Lymphoma (CMS-HCC)         PAST MEDICAL HISTORY:  Past Medical History:   Diagnosis Date   ??? Allergy desensitization therapy    ??? Anxiety    ??? Arthritis    ??? Back pain, chronic 2003    pinched nerve   ??? Depression    ??? Disease of thyroid gland    ??? Dry eyes     Had permanent cautery lower canula, tried plugs  and partial but caused waatering.   ??? Dysphagia    ??? Dysplastic nevi    ??? Eye trauma     wood chip with corneal abrasion right eye.  metal right eye removed also with abrasion   ??? Fibromyalgia    ??? Fibromyalgia, primary    ??? GERD (gastroesophageal reflux disease)    ??? High cholesterol    ??? History of gastric ulcer    ??? History of underactive thyroid    ??? Hypertension     watching   ??? IBS (irritable bowel syndrome)    ??? Long term prescription benzodiazepine use 06/29/2017   ??? Lupus (CMS-HCC)    ??? Lymphoma (CMS-HCC)     in remission; s/p chemotherapy - NON HODGKINS   ??? Major depressive disorder    ??? Mixed stress and urge urinary incontinence 02/08/2013   ??? Sjoegren syndrome    ??? Urinary, incontinence, stress female    ??? Vaginal prolapse MEDICATIONS:  Current Outpatient Medications   Medication Sig Dispense Refill   ??? acetaminophen (TYLENOL) 500 MG tablet Take 500 mg by mouth every six (6) hours as needed for pain.     ??? albuterol (PROVENTIL HFA;VENTOLIN HFA) 90 mcg/actuation inhaler Inhale 2 puffs. As needed     ??? ammonium lactate (AMLACTIN) 12 % cream Apply topically once daily. 400 g 6   ??? ammonium lactate (LAC-HYDRIN) 12 % lotion As needed     ??? belimumab (BENLYSTA) 400 mg SolR Infuse 720 mg into a venous catheter every thirty (30) days.     ??? cholecalciferol, vitamin D3, (VITAMIN D3) 1,000 unit capsule Take 1,000 Units by mouth daily.     ??? clonazePAM (KLONOPIN) 1 MG tablet Take one tablet for sleep as needed at bed time 30 tablet 1   ??? clotrimazole (LOTRIMIN) 1 % cream Apply topically Two (2) times a  day. 30 g 0   ??? cyanocobalamin 1000 MCG tablet Take 1,000 mcg by mouth daily.     ??? diclofenac sodium (VOLTAREN) 1 % gel Apply 2 g topically Four (4) times a day. 100 g 0   ??? DULoxetine (CYMBALTA) 60 MG capsule Take 1 capsule (60 mg total) by mouth daily. 90 capsule 1   ??? empty container Misc Use as directed 1 each PRN   ??? EPINEPHrine (EPIPEN) 0.3 mg/0.3 mL injection Inject 0.3 mL (0.3 mg total) into the muscle Take as directed. Note:carry at all times 1 each 3   ??? estradiol (ESTRACE) 0.01 % (0.1 mg/gram) vaginal cream Insert into the vagina.     ??? fluticasone propionate (FLONASE) 50 mcg/actuation nasal spray 2 sprays by Each Nare route Two (2) times a day. 16 mL prn   ??? gabapentin (NEURONTIN) 100 MG capsule Take 100 mg (1 cap) each morning and 300 mg (3 caps) each evening 360 capsule 3   ??? galcanezumab-gnlm (EMGALITY) 120 mg/mL injection Inject 120 mg under the skin every thirty (30) days. 1 mL 11   ??? hydroCHLOROthiazide (HYDRODIURIL) 25 MG tablet Take 0.5 tablets (12.5 mg total) by mouth daily. 46 tablet 3   ??? levocetirizine (XYZAL) 5 MG tablet 1 daily prn allergy 30 tablet 2   ??? levocetirizine (XYZAL) 5 MG tablet TAKE 1 TABLET BY MOUTH EVERY DAY AS NEEDED FOR ALLERGIES 90 tablet 0   ??? levothyroxine (SYNTHROID, LEVOTHROID) 50 MCG tablet Take 1 tablet (50 mcg total) by mouth daily. 90 tablet 1   ??? lidocaine (LIDODERM) 5 % patch Place 1 patch on the skin daily as needed (12 hours on/12 hours off). Apply to affected area for 12 hours only each day (then remove patch) 60 patch 5   ??? metoprolol succinate (TOPROL XL) 25 MG 24 hr tablet Take 1 tablet (25 mg total) by mouth daily. 30 tablet 11   ??? multivitamin (TAB-A-VITE/THERAGRAN) per tablet Take 1 tablet by mouth daily.     ??? naproxen (NAPROSYN) 500 MG tablet Take 1 tablet (500 mg total) by mouth 2 (two) times a day with meals. TAKE 1 TABLET (500 MG TOTAL) BY MOUTH 2 (TWO) TIMES A DAY WITH MEALS. 60 tablet 11   ??? omega 3-dha-epa-fish oil 900 mg-360 mg- 455 mg-1,000 mg cap      ??? omeprazole (PRILOSEC) 40 MG capsule Take 1 capsule (40 mg total) by mouth daily. 90 capsule 3   ??? pyridoxine, vitamin B6, (B-6) 100 MG tablet Take 100 mg by mouth daily.     ??? SPONIX ARTHRITIS-MUSCLE PAIN 4-10-0.035 % srlo      ??? tiZANidine (ZANAFLEX) 2 MG tablet Take 0.5 tablets (1 mg total) by mouth nightly as needed (back pain). for up to 30 doses Tapering off. Plan to discontinue after 6-8 weeks. 30 tablet 1   ??? traMADol (ULTRAM) 50 mg tablet Take 0.5 tablets (25 mg total) by mouth daily as needed for pain or pain,severe (7-10). 15 tablet 3   ??? traZODone (DESYREL) 50 MG tablet Take 1 tablet (50 mg total) by mouth nightly. 90 tablet 1     Current Facility-Administered Medications   Medication Dose Route Frequency Provider Last Rate Last Dose   ??? galcanezumab-gnlm (EMGALITY) injection 240 mg  240 mg Subcutaneous Once Enedina Finner, MD           ALLERGIES:  Allergies   Allergen Reactions   ??? Amoxicillin Swelling     Sweating and swelling in throat      ???  Ketorolac Shortness Of Breath   ??? Sulfa (Sulfonamide Antibiotics) Other (See Comments)     welts in my mouth   ??? Zocor [Simvastatin] Other (See Comments) Had issues w/ bearing weight and pain in legs. *Cannot have any statins at all.*     ??? Statins-Hmg-Coa Reductase Inhibitors Other (See Comments)     Leg paralysis  Leg paralysis       SOCIAL HISTORY:  Social History     Socioeconomic History   ??? Marital status: Divorced     Spouse name: Not on file   ??? Number of children: 3   ??? Years of education: Not on file   ??? Highest education level: High school graduate   Occupational History   ??? Occupation: hair stylist    Social Needs   ??? Financial resource strain: Somewhat hard   ??? Food insecurity:     Worry: Never true     Inability: Never true   ??? Transportation needs:     Medical: No     Non-medical: No   Tobacco Use   ??? Smoking status: Never Smoker   ??? Smokeless tobacco: Never Used   Substance and Sexual Activity   ??? Alcohol use: No     Alcohol/week: 0.0 standard drinks   ??? Drug use: No     Comment: 1970's tried marijuana a few times   ??? Sexual activity: Not on file   Lifestyle   ??? Physical activity:     Days per week: 0 days     Minutes per session: 0 min   ??? Stress: Very much   Relationships   ??? Social connections:     Talks on phone: More than three times a week     Gets together: Once a week     Attends religious service: Never     Active member of club or organization: No     Attends meetings of clubs or organizations: Never     Relationship status: Not on file   Other Topics Concern   ??? Do you use sunscreen? Yes   ??? Tanning bed use? No   ??? Are you easily burned? Yes   ??? Excessive sun exposure? Yes   ??? Blistering sunburns? Yes   Social History Narrative   ??? Not on file       FAMILY HISTORY:  Family History   Problem Relation Age of Onset   ??? Diabetes Mother    ??? Crohn's disease Mother    ??? Emphysema Mother    ??? Glaucoma Mother    ??? Asthma Mother    ??? Osteoporosis Mother    ??? Parkinsonism Mother    ??? Stroke Father    ??? Heart disease Father    ??? Cancer Father    ??? Migraines Father    ??? Hypertension Sister    ??? Migraines Sister    ??? Migraines Daughter    ??? No Known Problems Son    ??? No Known Problems Son    ??? ADD / ADHD Neg Hx    ??? Alcohol abuse Neg Hx    ??? Anxiety disorder Neg Hx    ??? Bipolar disorder Neg Hx    ??? Dementia Neg Hx    ??? Depression Neg Hx    ??? Drug abuse Neg Hx    ??? OCD Neg Hx    ??? Paranoid behavior Neg Hx    ??? Physical abuse Neg Hx    ??? Schizophrenia Neg Hx    ???  Sexual abuse Neg Hx    ??? Seizures Neg Hx    ??? Macular degeneration Neg Hx    ??? Blindness Neg Hx        REVIEW OF SYSTEMS:  See HPI. A 10 system ROS is otherwise negative.    VITAL SIGNS:   There were no vitals filed for this visit.    EXAM:  ECOG:   CONST: NAD, Awake, Alert  HEENT: Oropharynx clear.   LYMPH: No cervical, supraclavicular, axillary, or inguinal LAD  RESP: Clear to auscultation bilaterally.  CV: Regular rate and rhythm. No rubs, gallops or murmurs.   GI: Soft, nontender, nondistended. No hepatosplenomegaly.   MSK: No edema.  NEURO: No focal deficits    LABORATORY:  No visits with results within 1 Day(s) from this visit.   Latest known visit with results is:   Procedure visit on 12/31/2017   Component Date Value Ref Range Status   ??? Sodium 12/31/2017 137  135 - 145 mmol/L Final   ??? Potassium 12/31/2017 5.0  3.5 - 5.0 mmol/L Final   ??? Chloride 12/31/2017 99  98 - 107 mmol/L Final   ??? CO2 12/31/2017 28.0  22.0 - 30.0 mmol/L Final   ??? BUN 12/31/2017 18  7 - 21 mg/dL Final   ??? Creatinine 12/31/2017 0.72  0.60 - 1.00 mg/dL Final   ??? BUN/Creatinine Ratio 12/31/2017 25   Final   ??? EGFR CKD-EPI Non-African American,* 12/31/2017 88  >=60 mL/min/1.38m2 Final   ??? EGFR CKD-EPI African American, Fem* 12/31/2017 >90  >=60 mL/min/1.32m2 Final   ??? Glucose 12/31/2017 83  65 - 179 mg/dL Final   ??? Calcium 16/12/9602 10.6* 8.5 - 10.2 mg/dL Final   ??? Albumin 54/11/8117 4.9  3.5 - 5.0 g/dL Final   ??? Total Protein 12/31/2017 8.1  6.5 - 8.3 g/dL Final   ??? Total Bilirubin 12/31/2017 0.8  0.0 - 1.2 mg/dL Final   ??? AST 14/78/2956 37  17 - 47 U/L Final   ??? ALT 12/31/2017 38  13 - 69 U/L Final   ??? Alkaline Phosphatase 12/31/2017 74  38 - 126 U/L Final   ??? Anion Gap 12/31/2017 10  7 - 15 mmol/L Final   ??? LDH 12/31/2017 638* 338 - 610 U/L Final   ??? C3 Complement 12/31/2017 145  88 - 171 mg/dL Final   ??? C4 Complement 12/31/2017 27.6  15.0 - 48.0 mg/dL Final   ??? dsDNA Ab 21/30/8657 Negative  Negative Final   ??? ENA Screen 12/31/2017 0.10  <0.70 ENA Units Final   ??? Vitamin D Total (25OH) 12/31/2017 26.4  20.0 - 80.0 ng/mL Final   ??? BUN 12/31/2017 18  7 - 21 mg/dL Final   ??? Creatinine 12/31/2017 0.72  0.60 - 1.00 mg/dL Final   ??? EGFR CKD-EPI Non-African American,* 12/31/2017 88  >=60 mL/min/1.80m2 Final   ??? EGFR CKD-EPI African American, Fem* 12/31/2017 >90  >=60 mL/min/1.44m2 Final   ??? WBC 12/31/2017 6.3  4.5 - 11.0 10*9/L Final   ??? RBC 12/31/2017 4.83  4.00 - 5.20 10*12/L Final   ??? HGB 12/31/2017 14.7  12.0 - 16.0 g/dL Final   ??? HCT 84/69/6295 44.4  36.0 - 46.0 % Final   ??? MCV 12/31/2017 91.9  80.0 - 100.0 fL Final   ??? MCH 12/31/2017 30.5  26.0 - 34.0 pg Final   ??? MCHC 12/31/2017 33.2  31.0 - 37.0 g/dL Final   ??? RDW 28/41/3244 13.8  12.0 - 15.0 % Final   ???  MPV 12/31/2017 7.1  7.0 - 10.0 fL Final   ??? Platelet 12/31/2017 305  150 - 440 10*9/L Final   ??? Neutrophils % 12/31/2017 56.0  % Final   ??? Lymphocytes % 12/31/2017 31.9  % Final   ??? Monocytes % 12/31/2017 5.9  % Final   ??? Eosinophils % 12/31/2017 2.7  % Final   ??? Basophils % 12/31/2017 1.1  % Final   ??? Absolute Neutrophils 12/31/2017 3.5  2.0 - 7.5 10*9/L Final   ??? Absolute Lymphocytes 12/31/2017 2.0  1.5 - 5.0 10*9/L Final   ??? Absolute Monocytes 12/31/2017 0.4  0.2 - 0.8 10*9/L Final   ??? Absolute Eosinophils 12/31/2017 0.2  0.0 - 0.4 10*9/L Final   ??? Absolute Basophils 12/31/2017 0.1  0.0 - 0.1 10*9/L Final   ??? Large Unstained Cells 12/31/2017 2  0 - 4 % Final   ??? WBC 12/31/2017 6.3  4.5 - 11.0 10*9/L Final   ??? RBC 12/31/2017 4.89  4.00 - 5.20 10*12/L Final   ??? HGB 12/31/2017 14.8  12.0 - 16.0 g/dL Final   ??? HCT 16/12/9602 44.9  36.0 - 46.0 % Final   ??? MCV 12/31/2017 91.8  80.0 - 100.0 fL Final   ??? MCH 12/31/2017 30.3  26.0 - 34.0 pg Final   ??? MCHC 12/31/2017 33.1  31.0 - 37.0 g/dL Final   ??? RDW 54/11/8117 14.0  12.0 - 15.0 % Final   ??? MPV 12/31/2017 7.0  7.0 - 10.0 fL Final   ??? Platelet 12/31/2017 314  150 - 440 10*9/L Final   ??? Neutrophils % 12/31/2017 55.7  % Final   ??? Lymphocytes % 12/31/2017 32.3  % Final   ??? Monocytes % 12/31/2017 5.6  % Final   ??? Eosinophils % 12/31/2017 2.5  % Final   ??? Basophils % 12/31/2017 1.1  % Final   ??? Absolute Neutrophils 12/31/2017 3.5  2.0 - 7.5 10*9/L Final   ??? Absolute Lymphocytes 12/31/2017 2.0  1.5 - 5.0 10*9/L Final   ??? Absolute Monocytes 12/31/2017 0.4  0.2 - 0.8 10*9/L Final   ??? Absolute Eosinophils 12/31/2017 0.2  0.0 - 0.4 10*9/L Final   ??? Absolute Basophils 12/31/2017 0.1  0.0 - 0.1 10*9/L Final   ??? Large Unstained Cells 12/31/2017 3  0 - 4 % Final       IMAGING:    PATHOLOGY:

## 2018-01-26 ENCOUNTER — Ambulatory Visit: Admit: 2018-01-26 | Discharge: 2018-01-27 | Payer: MEDICARE | Attending: Neurology | Primary: Neurology

## 2018-01-26 DIAGNOSIS — G43709 Chronic migraine without aura, not intractable, without status migrainosus: Secondary | ICD-10-CM

## 2018-01-26 DIAGNOSIS — G894 Chronic pain syndrome: Secondary | ICD-10-CM

## 2018-01-26 DIAGNOSIS — F419 Anxiety disorder, unspecified: Secondary | ICD-10-CM

## 2018-01-26 DIAGNOSIS — Z8579 Personal history of other malignant neoplasms of lymphoid, hematopoietic and related tissues: Secondary | ICD-10-CM

## 2018-01-26 DIAGNOSIS — R200 Anesthesia of skin: Principal | ICD-10-CM

## 2018-01-26 NOTE — Unmapped (Signed)
NEUROLOGY REPORT  [facility]    Date: January 26, 2018  Patient Name: Olivia Stout  MRN: [id number]  PCP:  Lolita Lenz  Time in with patient: 12:43 PM  Time out with patient: 1: 15 PM  Prior visit with this neurologist: 08/22/2017    Assessment and Plan/ Clinical Decision Making          1. Numbness  This is a new concern. Examination does not reveal any evidence to support a myelopathic, radiculopathy or neuropathic component. Reflexes are preserved, albeit brisk, as per initial evaluation. Had EMG in 2011 that was normal. I do not believe that EMG would add clinical data that would make any change to the patient's treatment plan and furthermore, given her pain, EMG would likely worsen her pain syndrome.     She would benefit from ongoing counseling and minimizing any interventions that may cause harm.     Will recheck B12.   Reassured.   Reviewed brain MRI.   - Vitamin B12 Level; Future    2. Chronic migraine  She clearly has 2 types of headache including chronic migraine superimposed on medication overuse headache related to her chronic use of multiple pain medications for her other pain as well.   Less medication overuse, but has not done well with Botox. Here today for Emgality- loading dose administered with no problems. Patient states she cannot do it herself.    Reviewed recent Brain MRI/A- normal.     We have discussed both abortive and prophylactic treatments of her migraine.    She has not tolerated rizatriptan or sumatriptan due to side effects. No further triptans offered.     At this time would like her to consider avoiding Naprosyn and other pain medications.  Consider eTNS or VNS for migraine- not covered by insurance- out of pocket cost is high- given her information about the site.   Headache calendar importance reviewed. She would like to use Migraine Buddy.     For prophylactic medications, oral agents are contraindicated given her multiple comorbidities and other medications and a very high risk for polypharmacy.  Therefore all of the oral agents currently recommended by the American neurological Society are not indicated for this patient.  However, injectable agents such as Botox and/or monoclonal antibodies namely CGRP antagonists are a reasonable option given that they will have lower medication interaction and risk.  In this category, Botox is the superior agent given that it is unlikely to interact with her other medications.  The monoclonal antibody medications are new to the market and it is unclear how they will interact with her Benlysta. She has no history of cardiac disease so they are definitely an option.     Since she is unable to self-inject, will schedule visit for injection in 1 month. Patient notified that this will be an injection only visit.   Headache triggers reviewed in detail.  Sleep hygiene reviewed.   Needs counseling and mental health support- she has significant anxiety about her symptoms and may benefit from improved skillset to manage her numerous symptoms.     3. History of lymphoma  Recent evaluation reveals no recurrence.     4. Chronic pain syndrome  Will avoid worsening (e.g. avoid EMG).   Monitor clinically.     5. Anxiety  Counseled patient to continue Mental Health Services- also needs to see a therapist.     HEALTH EDUCATION/HEALTH LITERACY: PATIENT EDUCATION was provided. Reviewed the differential diagnosis, plan of care in  detail, as well as other elements as detailed above. The benefits and risks of each of the above procedures, benefits and alternative options were reviewed with the patient.   Medication Management: There is a high risk for medication side effects, which will require ongoing monitoring and dose adjustment. Medication side effect profile was reviewed in detail with patient. Medication safety and drug interactions reviewed on ERx.   Patient Counselling: All the patient's questions and concerns were addressed to their stated satisfaction .    Barriers to Care: Multiple factors including depression, comorbid medical conditions, ongoing biological therapy, cost of medications and cost of care, access to care.Very anxious.   Total face-to-face time included: 30 minutes > 50 % in direct consultation reviewing details of history, examination and data findings, discussing my clinical reasoning and clinical impression, as well as a review of my recommendations for a plan of treatment as documented above. Additional time was provided to address alternative options for care, health literacy regarding the differential diagnosis, testing and medication options, the benefits of current plan as well as an opportunity to address all the patient's questions and concerns to their reported satisfaction. A written summary was provided to the patient at the time of the visit, as well as instructions on how to access My Chart. My contact information was provided along with instructions on how to reach the administrative office and the clinic.     Return in about 4 weeks (around 02/23/2018) for Emgality Injection.         Subjective:          SOURCE: The history is obtained from the patient who appears to be a vague historian. She has been communicating with me via MyChart. She has a hard time answering questions even with direct questions. Tangential in answering questions. Additional history is obtained from review of available medical records. Open-ended and close-ended questions, as well as questionnaire data and review of EPIC chart was used to obtain details of the history.    History of Present Illness:     Olivia Stout is a 67 y.o. right handed female seen in followup for evaluation of Numbness (Evaluation and management) and Emgality Injections (Education and administration)      CURRENT VISIT: She was originally scheduled to have Loading dose of Emgality today along with teaching. However, she had a recent evaluation with Rheumatology, noted paresthesiae and is here for evaluation and management of these.     Concerns include:   1. Paresthesia: left forearm, mostly along the inside of her forearm, into the left armpit. No clear exacerbating or alleviating factors. No triggers. Over 1 year in onset, with no clear origin. H/o surgery to left wrist years ago with plate in situ. No rash. Continuous-Left arm painful and numb.     Associated: Pain her back (lumbar region) and intermittent numbness of inner thighs, above the knees bilaterally, again with no clear triggers, exacerbating or alleviating factors. Worse as the day goes on. Many other symptoms, not clearly associated.     2. Headaches: ongoing, persistent. Eyes hurt- has known dry eye. Recent evaluations with ophthalmology- one provider (Dr Earlene Plater) has left and she is anxious to see a new person. Has met with two other ophthalmologists in last few months.     3. Dizzy: feels dizzy when she has her headaches. Closed her eyes getting out of bed and missed her step stool. States the dizziness is a reason she has not been able to participate  in PT.     4. Right ear pain: intermittent, cannot lie on her right side. Worse today.     5. Lump on her left wrist: worried about it.     6. Intermittent changes in sensation in the legs, bilaterally- no clear triggers, aggravating or alleviating symptoms.     7. Right knee pain- s/p surgery and feels it may give out. Worse if standing/walking on it.     Recent visit with Oncology- no recurrence of disease.   Recent visit with Rheumatology- has monthly infusions.   Recent visits with Ophthalmology and Dermatology.         PRIOR VISIT:     Improved with Cold rag, rest.   No better with Botox, ES Tylenol.   Has not yet seen her Eye Provider- is on Plaquenil. Does have dry eye as well.   Recent sinus infection- was better while on antibiotics- now worse again.   HA is daily, worse as the day wears on.   Pain is over left eye/forehead/ temple.   Ptosis persists- unchanged.       New problem: Lower back pain on the right occasionally right leg pain- she had back surgery 2 years ago for same problem. May 2017- surgery in [place], [place]. Dr Gwendlyn Deutscher [facility])    Current activity: Walks 15-20 minutes every other day. Last had PT: (limited by dizziness)- 3 years ago.       PRIOR VISIT: (May)    She is here today with droopy eyelids. This was noticed about a week after Botox Rx (sent me a MyChart message). Today also complains of diplopia. Feels that her eyes are crossed. Initially states headaches are not better, then stated that much better and only on the left side.     She reports that her headaches have changed: perhaps 2 1/2 - 3 weeks ago.   Her headache is different now- focused over the left the temple. She no longer has pain on the right side. During the visit, she also reports an improvement overall in her headaches.   Has a new psychiatrist.   Feels like tension, moving over the eyes. Throbbing.   Frequency: Every day.   Currently taking: extra strength Tylenol with not much relief.   She reports that the Triptans intensified the headache- no relief. She reports noticing the intensification of the headache. This occurred some time after taking the medication- 30-45 minutes. No other side effects. Tried half with no improvement. She has not taken more than 1 and 1/2  dose of either.    She reports photophobia and phonophobia, nausea.   She did not report symptoms to Ophthalmology on 3/20 and states she did not have those symptoms then.   She notes that a week and a half ago, she noticed double vision- worse when looking up close, and worse this morning. Reports horizontal diplopia. Never previously.   When looking at the distance, some discomfort over the brows. Worse this morning.     Prior history: April  She reports that she has had headaches for months.  She states that she has a long history of migraines, most recently had migraines about 5 or 6 years ago.  These were always typically severe headaches that affected her quality of life and her ability to function worse with bright lights and improved with laying down.  Her headaches resolved about 5 or 6 years ago and returned last year associated with bad traumatic stress [SIC].  Since then,  she feels that her headaches have been so bad that she is unable to get any relief.  She states that she first noticed this around May or June of last year.  They are almost always associated with photophobia and they hurt really badly that she cannot function.  She has trouble lying down because her head hurts when she lies down.  The pain is mostly over the temples but can occur on either side.  Her headaches are associated with nausea changes in her vision and pain that radiates into both arms worse on the left than on the right that she describes as a nerve pain that is uncomfortable.  The headache is both squeezing and tight and also throbbing.  Her only relief is with some lotion of her head and neck and Tylenol but it is mild.  Her headaches typically last all day long but there is some diurnal variation and that her symptom the day goes on.  Her headaches are there every day with no relief.  Initially she thought lisinopril might be worsening her headache.  She has since discontinued lisinopril with no improvement.    She does take significant medications every day and her list was reviewed today in addition she takes Naprosyn, tizanidine, gabapentin and tramadol for ongoing pain related to her back, some history of back surgery as well as her lupus.  She has been taking these medications for some time predating the onset of her worsening headaches.    Her sleep is reasonable with medications she does have significant dreams but denies any snoring.  She uses meditation to help her.  She has a strong family history of migraines she has 1 daughter with headaches a sister with headaches and her father had severe headaches.  She has a long history of lupus and is currently on Benlysta.  She moved to [place] at age 89 and recently moved back from [place] in August after her husband left her to live with his mother which has been very traumatic for her.  He currently sees a psychiatrist and her access to healthcare is limited by her insurance.  She has had no headache free days in the last 30 days and her quality of life and ADLs are affected by her headache.  She is currently on no abortive medications but is on several medications that she takes for chronic pain including gabapentin and tizanidine and Cymbalta.  She recalls taking triptan's many years ago but not recently.  She has no history of heart disease.  Other studies/data: normal brain MRI/MRA for same indication in 2006 at [facility]- results reviewed.     Sleep Study: no  Imaging: yes    TRAVEL HISTORY: [place]  CHILDHOOD HISTORY: Headaches in her teens.  Review of Systems: A 10-systems review was performed. Significant comorbid conditions including trouble sleeping, dry eye, back pain, arm pain, stress and depression.  Has a psychiatrist, but not a therapist. Unless otherwise identified here, in HPI or by patient  no additional symptoms identified.     Past Medical History:   Diagnosis Date   ??? Allergy desensitization therapy    ??? Anxiety    ??? Arthritis    ??? Back pain, chronic 2003    pinched nerve   ??? Depression    ??? Disease of thyroid gland    ??? Dry eyes     Had permanent cautery lower canula, tried plugs  and partial but caused waatering.   ??? Dysphagia    ???  Dysplastic nevi    ??? Eye trauma     wood chip with corneal abrasion right eye.  metal right eye removed also with abrasion   ??? Fibromyalgia    ??? Fibromyalgia, primary    ??? GERD (gastroesophageal reflux disease)    ??? High cholesterol    ??? History of gastric ulcer    ??? History of underactive thyroid    ??? Hypertension     watching   ??? IBS (irritable bowel syndrome)    ??? Long term prescription benzodiazepine use 06/29/2017   ??? Lupus (CMS-HCC)    ??? Lymphoma (CMS-HCC)     in remission; s/p chemotherapy - NON HODGKINS   ??? Major depressive disorder    ??? Mixed stress and urge urinary incontinence 02/08/2013   ??? Sjoegren syndrome    ??? Urinary, incontinence, stress female    ??? Vaginal prolapse          Past Surgical History:   Procedure Laterality Date   ??? BACK SURGERY  07/27/2015   ??? ESOPHAGEAL DILATION     ??? FRACTURE SURGERY      left forearm   ??? HYSTERECTOMY     ??? KNEE SURGERY     ??? LAPAROSCOPIC NISSEN FUNDOPLICATION     ??? PR ANAL PRESSURE RECORD Left 12/01/2012    Procedure: ANORECTAL MANOMETRY;  Surgeon: None None;  [facility];  Service: Gastroenterology   ??? PR BREATH HYDROGEN TEST N/A 12/01/2012    Procedure: BREATH HYDROGEN TEST;  Surgeon: None None;  [facility];  Service: Gastroenterology   ??? SPINE SURGERY     ??? TAH and bladder surgery      partial   ??? TONSILLECTOMY         Social History     Patient does not qualify to have social determinant information on file (likely too young).   Social History Narrative   ??? Not on file       Social History     Tobacco Use   Smoking Status Never Smoker   Smokeless Tobacco Never Used         reports no history of alcohol use.      Family History   Problem Relation Age of Onset   ??? Diabetes Mother    ??? Crohn's disease Mother    ??? Emphysema Mother    ??? Glaucoma Mother    ??? Asthma Mother    ??? Osteoporosis Mother    ??? Parkinsonism Mother    ??? Stroke Father    ??? Heart disease Father    ??? Cancer Father    ??? Migraines Father    ??? Hypertension Sister    ??? Migraines Sister    ??? Migraines Daughter    ??? No Known Problems Son    ??? No Known Problems Son    ??? ADD / ADHD Neg Hx    ??? Alcohol abuse Neg Hx    ??? Anxiety disorder Neg Hx    ??? Bipolar disorder Neg Hx    ??? Dementia Neg Hx    ??? Depression Neg Hx    ??? Drug abuse Neg Hx    ??? OCD Neg Hx    ??? Paranoid behavior Neg Hx    ??? Physical abuse Neg Hx    ??? Schizophrenia Neg Hx    ??? Sexual abuse Neg Hx    ??? Seizures Neg Hx    ??? Macular degeneration Neg Hx    ???  Blindness Neg Hx          Allergies   Allergen Reactions   ??? Amoxicillin Swelling     Sweating and swelling in throat      ??? Ketorolac Shortness Of Breath   ??? Sulfa (Sulfonamide Antibiotics) Other (See Comments)     welts in my mouth   ??? Zocor [Simvastatin] Other (See Comments)     Had issues w/ bearing weight and pain in legs. *Cannot have any statins at all.*     ??? Statins-Hmg-Coa Reductase Inhibitors Other (See Comments)     Leg paralysis  Leg paralysis       Current Outpatient Medications   Medication Sig Dispense Refill   ??? acetaminophen (TYLENOL) 500 MG tablet Take 500 mg by mouth every six (6) hours as needed for pain.     ??? albuterol (PROVENTIL HFA;VENTOLIN HFA) 90 mcg/actuation inhaler Inhale 2 puffs. As needed     ??? ammonium lactate (AMLACTIN) 12 % cream Apply topically once daily. 400 g 6   ??? ammonium lactate (LAC-HYDRIN) 12 % lotion As needed     ??? belimumab (BENLYSTA) 400 mg SolR Infuse 720 mg into a venous catheter every thirty (30) days.     ??? cholecalciferol, vitamin D3, (VITAMIN D3) 1,000 unit capsule Take 1,000 Units by mouth daily.     ??? clonazePAM (KLONOPIN) 1 MG tablet Take one tablet for sleep as needed at bed time 30 tablet 1   ??? clotrimazole (LOTRIMIN) 1 % cream Apply topically Two (2) times a day. 30 g 0   ??? cyanocobalamin 1000 MCG tablet Take 1,000 mcg by mouth daily.     ??? diclofenac sodium (VOLTAREN) 1 % gel Apply 2 g topically Four (4) times a day. 100 g 0   ??? DULoxetine (CYMBALTA) 60 MG capsule Take 1 capsule (60 mg total) by mouth daily. 90 capsule 1   ??? empty container Misc Use as directed 1 each PRN   ??? EPINEPHrine (EPIPEN) 0.3 mg/0.3 mL injection Inject 0.3 mL (0.3 mg total) into the muscle Take as directed. Note:carry at all times 1 each 3   ??? estradiol (ESTRACE) 0.01 % (0.1 mg/gram) vaginal cream Insert into the vagina.     ??? fluticasone propionate (FLONASE) 50 mcg/actuation nasal spray 2 sprays by Each Nare route Two (2) times a day. 16 mL prn   ??? gabapentin (NEURONTIN) 100 MG capsule Take 100 mg (1 cap) each morning and 300 mg (3 caps) each evening 360 capsule 3   ??? galcanezumab-gnlm (EMGALITY) 120 mg/mL injection Inject 120 mg under the skin every thirty (30) days. 1 mL 11   ??? levocetirizine (XYZAL) 5 MG tablet 1 daily prn allergy 30 tablet 2   ??? levothyroxine (SYNTHROID, LEVOTHROID) 50 MCG tablet Take 1 tablet (50 mcg total) by mouth daily. 90 tablet 1   ??? lidocaine (LIDODERM) 5 % patch Place 1 patch on the skin daily as needed (12 hours on/12 hours off). Apply to affected area for 12 hours only each day (then remove patch) 60 patch 5   ??? metoprolol succinate (TOPROL XL) 25 MG 24 hr tablet Take 1 tablet (25 mg total) by mouth daily. 30 tablet 11   ??? multivitamin (TAB-A-VITE/THERAGRAN) per tablet Take 1 tablet by mouth daily.     ??? naproxen (NAPROSYN) 500 MG tablet Take 1 tablet (500 mg total) by mouth 2 (two) times a day with meals. TAKE 1 TABLET (500 MG TOTAL) BY MOUTH 2 (TWO) TIMES A DAY  WITH MEALS. 60 tablet 11   ??? omega 3-dha-epa-fish oil 900 mg-360 mg- 455 mg-1,000 mg cap      ??? omeprazole (PRILOSEC) 40 MG capsule Take 1 capsule (40 mg total) by mouth daily. 90 capsule 3   ??? pyridoxine, vitamin B6, (B-6) 100 MG tablet Take 100 mg by mouth daily.     ??? SPONIX ARTHRITIS-MUSCLE PAIN 4-10-0.035 % srlo      ??? tiZANidine (ZANAFLEX) 2 MG tablet Take 0.5 tablets (1 mg total) by mouth nightly as needed (back pain). for up to 30 doses Tapering off. Plan to discontinue after 6-8 weeks. 30 tablet 1   ??? traMADol (ULTRAM) 50 mg tablet Take 0.5 tablets (25 mg total) by mouth daily as needed for pain or pain,severe (7-10). 15 tablet 3   ??? traZODone (DESYREL) 50 MG tablet Take 1 tablet (50 mg total) by mouth nightly. 90 tablet 1     Current Facility-Administered Medications   Medication Dose Route Frequency Provider Last Rate Last Dose   ??? galcanezumab-gnlm (EMGALITY) injection 240 mg  240 mg Subcutaneous Once Alizae Bechtel Ulysees Barns, MD Objective:        GENERAL PHYSICAL EXAM:    Vital Signs: Reviewed today.   BP 170/83  - Pulse 55  - Temp 36.6 ??C (97.8 ??F)  - Resp 18  - Ht 157.5 cm (5' 2)  - SpO2 96%  - BMI 29.15 kg/m??   Estimated body mass index is 29.15 kg/m?? as calculated from the following:    Height as of this encounter: 157.5 cm (5' 2).    Weight as of 01/23/18: 72.3 kg (159 lb 6.4 oz).  Facility age limit for growth percentiles is 20 years.  BP meds taken in the evening.     Wt Readings from Last 8 Encounters:   01/23/18 72.3 kg (159 lb 6.4 oz)   01/21/18 71.6 kg (157 lb 14.4 oz)   12/31/17 71.7 kg (158 lb)   12/31/17 71.9 kg (158 lb 8.2 oz)   12/17/17 71.1 kg (156 lb 12 oz)   12/10/17 71.1 kg (156 lb 11.2 oz)   12/10/17 71.3 kg (157 lb 3.2 oz)   12/03/17 70.8 kg (156 lb)       General Appearance: The patient was well-groomed and neat.   Head:  Atraumatic, normocephalic.   Right ear examined- picture on file- has small excoriated region over the pinna.     NEUROLOGICAL EXAMINATION   Mental Status  The patient is alert and oriented to time, place and person.   Attention adequate throughout the examination.   Speech and language were normal at the bedside.  Affect is anxious.   Tangential.     Cranial nerves  Intact. Mild ptosis bilaterally- unchanged.      Motor / Musculoskeletal   Strength is normal. No drift.   Reflexes: brisk bilaterally- unchanged. Plantars flexor.   Gait: Normal gait and stance.        Diagnostic Studies and Review of Records:     DATA   I have independently reviewed the studies as noted.   1. Medical Records: reviewed- prior neurology visit- EMG with Dr. Vickki Muff in 2011. CONCLUSION- Normal study. There is no evidence for a mononeuropathy or cervical radiculopathy.    2. Radiology: Results reviewed and report reviewed again today, as well as MRI.     Mra Head Wo Contrast  Result Date: 05/07/2017  EXAM: Magnetic resonance imaging, brain without and with contrast material, AND Magnetic resonance angiography,  head. DATE: 05/07/2017 10:59 AM ACCESSION: [id number] Bethann Humble [id number] UN DICTATED: 05/07/2017 11:11 AM INTERPRETATION LOCATION: [facility]     CLINICAL INDICATION: 67 years old Female with G43.709-Chronic migraine      COMPARISON: 02-27-05 MR     TECHNIQUE: Multiplanar, multisequence MR imaging of the brain was performed without and with I.V. contrast.  Time of flight MRA of intracranial arterial circulation was also performed.     FINDINGS: No focal parenchymal signal abnormality.     No midline shift or mass lesion. No intracranial hemorrhage or extra-axial fluid collection.     No evidence of acute infarct. No DWI signal abnormality.     No abnormal enhancement.     Intracranial MRA does not demonstrate any aneurysms or high grade stenoses.   --Normal MRI/MRA of the brain.      Mri Brain W Wo Contrast  Result Date: 05/07/2017  EXAM: Magnetic resonance imaging, brain without and with contrast material, AND Magnetic resonance angiography, head. DATE: 05/07/2017 10:59 AM ACCESSION: [id number] Bethann Humble [id number] UN DICTATED: 05/07/2017 11:11 AM INTERPRETATION LOCATION: [facility]     CLINICAL INDICATION: 67 years old Female with G43.709-Chronic migraine      COMPARISON: 02-27-05 MR     TECHNIQUE: Multiplanar, multisequence MR imaging of the brain was performed without and with I.V. contrast.  Time of flight MRA of intracranial arterial circulation was also performed.     FINDINGS: No focal parenchymal signal abnormality.     No midline shift or mass lesion. No intracranial hemorrhage or extra-axial fluid collection.     No evidence of acute infarct. No DWI signal abnormality.     No abnormal enhancement.     Intracranial MRA does not demonstrate any aneurysms or high grade stenoses.   --Normal MRI/MRA of the brain.      3.Labs: reviewed , including B12.   Lab Results   Component Value Date    WBC 6.3 12/31/2017    WBC 6.3 12/31/2017    HGB 14.7 12/31/2017    HGB 14.8 12/31/2017    HCT 44.4 12/31/2017    HCT 44.9 12/31/2017    PLT 305 12/31/2017    PLT 314 12/31/2017       Lab Results   Component Value Date    NA 137 12/31/2017    K 5.0 12/31/2017    CL 99 12/31/2017    CO2 28.0 12/31/2017    BUN 18 12/31/2017    BUN 18 12/31/2017    CREATININE 0.72 12/31/2017    CREATININE 0.72 12/31/2017    GLU 83 12/31/2017    CALCIUM 10.6 (H) 12/31/2017       Lab Results   Component Value Date    BILITOT 0.8 12/31/2017    PROT 8.1 12/31/2017    ALBUMIN 4.9 12/31/2017    ALT 38 12/31/2017    AST 37 12/31/2017    ALKPHOS 74 12/31/2017    GGT 42 12/31/2011     Dr. Hollie Beach    CC: Lolita Lenz, MD    Dictated using voice-activated software. Typographical errors may exist.

## 2018-01-26 NOTE — Unmapped (Addendum)
FOR THE NUMBNESS:  1. Please remind staff to draw B12 next time you get your infusion.     FOR YOUR HEADACHES:    2. You received loading dose for Emgality  3. Keep track of your headaches- Migraine Buddy is a great tool that you have already used- keep using it.   4. Schedule a visit with your THERAPIST.  5. If you are not comfortable with injection, you may schedule a visit with [place] to get it.     FOR YOUR KNEE:   6. I recommend PT.     FOR YOUR EYES:  7. Keep your appointment with your eyes.     FOR YOUR LEFT WRIST:  8. I recommend that you hold off on doing any invasive testing for now.     Warmest Regards,    Dr. Hollie Beach  Board Certified: Adult Neurology  [place] Division of General Medicine and Clinical Epidemiology  [place], [place]      It has been my pleasure to participate in your care.   You may reach me via MyChart, phone or fax.     Please note that while I will try to answer messages in a timely manner, all urgent issues should be directed to your PCP.       Helpful Contact Information :  ?? Appointments/ Internal Medicine Clinic: [phone]    ?? HIPPA Compliant Facsimile: [phone]  ?? Same Day Clinic:  [phone].  8am-5pm Monday through Friday   ?? After Hours:  [phone].  A nurse will answer and can help you decide what kind of medication attention you need.   ?? [facility] Urgent Care:  [phone].  If you are sick, but not injured:  9am-8pm Mon-Sun.  [address], [place], [place] off of [address].  All walk-ins accepted.    ?? [facility] at the [facility]:  [phone].  7am-9pm Mon-Fri; 12pm-5pm [address]  [address] [place] [address].  All walk-ins accepted.    ??    Thank you for choosing [employer].

## 2018-01-28 ENCOUNTER — Ambulatory Visit: Admit: 2018-01-28 | Discharge: 2018-01-29 | Payer: MEDICARE

## 2018-01-28 ENCOUNTER — Institutional Professional Consult (permissible substitution): Admit: 2018-01-28 | Discharge: 2018-01-29 | Payer: MEDICARE

## 2018-01-28 DIAGNOSIS — J3089 Other allergic rhinitis: Principal | ICD-10-CM

## 2018-01-28 DIAGNOSIS — M3219 Other organ or system involvement in systemic lupus erythematosus: Principal | ICD-10-CM

## 2018-01-28 DIAGNOSIS — R200 Anesthesia of skin: Secondary | ICD-10-CM

## 2018-01-28 LAB — VITAMIN B-12: Cobalamins:MCnc:Pt:Ser/Plas:Qn:: >1000 — ABNORMAL HIGH

## 2018-01-28 NOTE — Unmapped (Signed)
1145??Patient in today for Benlysta infusion. Patient has no s/s of infection. No issues from the last infusion.   1251??Benlysta started ,patient instructed to use call bell /call nurse for any s/s of unsual symptoms during infusion. Patient educated on possible s/s of reaction,such as chestpain ,itching,shortness of breath lightheadedness and any kind of discomfort. Patient verbalized understanding...  1441??Infusion completed and well tolerated.  1500??Benlysta 726.4 mg/250??ml??NS infused over 1hr and??21??min.??per protocol . Pt alert and oriented, offered no complaints during infusion. IV flushed with 10ml

## 2018-02-04 ENCOUNTER — Ambulatory Visit: Admit: 2018-02-04 | Discharge: 2018-02-05 | Payer: MEDICARE

## 2018-02-04 DIAGNOSIS — D485 Neoplasm of uncertain behavior of skin: Principal | ICD-10-CM

## 2018-02-04 NOTE — Unmapped (Addendum)
Chondrodermatitis Nodularis Helicis      Shave biopsy   A shave biopsy involves numbing a small area of your skin and then obtaining a sample to help [place] with proper diagnosis or skin condition. Biopsy results typically return in 7 to 14 days.    To care for the area: Leave the bandage in place until the morning after your procedure is performed. On a daily basis, carefully remove the bandage, then shower or wash as usual. Allow water to run over the site. Please do not scrub. Carefully dry the area, then apply ointment (some people develop an allergy to Neosporin, so we recommend Vaseline orAquaphor). Cover the site with a fresh bandage. Should any bleeding occur, apply firm pressure for 15 minutes. The treated site will heal best if  a scab never forms (the wound heals by new skin cells traveling from the outside toward the middle-their journey is easier if no scab stands in their way).    Long-term care: the site will be more sensitive than your surrounding skin. Keep it covered, and remember to apply sunscreen every day to all your exposed skin. A scar may remain which is lighter or pinker than your normal skin. Your body will continue to improve your scar for up to one year.    Infection following this procedure is rare. However, if you are worried about the appearance of your site, contact your doctor. Complete healing may take up to one month. We have a physician on call at all times. If you have any concerns about the site, please call our clinic at [phone]      Original pillow with a hole

## 2018-02-04 NOTE — Unmapped (Signed)
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   - Discussed biopsy risks/benefits/expectations.   - Biopsy of lesion(s) in question  was performed in typical fashion using 2% lidocaine with epinephrine and gentle electocautery for hemostasis.   - Dermatopathology Order  - Counseled on need to relieve pressure. Recommended [facility] hole pillow.     Itchy spots on back:  - Will return in 6 weeks for ear recheck and skin check.     Return to clinic: 6 weeks for complete skin check.     CHIEF COMPLAINT:  Lesion of concern    HPI:   This is a pleasant 67 y.o.-year-old who last saw me on 06/04/17, at that time for an actinic keratosis treated on her nose. This issue has now resolved. She comes in today for a separate issue of concern.     Problem 1: Lesion of concern  Location: Right ear  Duration: Months, worsening  Associated symptoms: Tenderness pain. 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 Previously on Plaquenil, d/ced due to occular toxicity.    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     ALLERGIES:   Amoxicillin; Ketorolac; Sulfa (sulfonamide antibiotics); Zocor [simvastatin]; and Statins-hmg-coa reductase inhibitors    REVIEW OF SYSTEMS:  Baseline state of health. No recent illnesses. No other skin complaints.    PHYSICAL EXAMINATION:  Examination in the presence of female chaperone:  General: Well-developed, well-nourished. No acute distress. Neuro: Alert and oriented, answers questions appropriately. Skin: Examination of the scalp, face, neck, limited exam of back was performed and notable for the following:  Ulcerated papule with erythema on right ant-helix and helix

## 2018-02-06 NOTE — Unmapped (Signed)
Patient called nurse line stating that she had a shave done on 11/13 and the site is swollen and throbbing. Please advise.  Lanora Manis

## 2018-02-07 NOTE — Unmapped (Signed)
I called and spoke with Olivia Stout.  She is having significant pain in her ear.  Reassuringly, her biopsy returned as chondrodermatitis nodularis.  She is not diabetic and has not had fevers and chills but she is having redness, swelling, tenderness.  Many of the symptoms she was having prior to the biopsy.  She is not having frank drainage currently.  Besides avoiding pressure on the area, I recommended applying over-the-counter Neosporin twice daily to help to prevent against otitis externa.  I recommended ibuprofen or naproxen for pain control.  I counseled her that if she develops significant swelling, redness, or significant worsening in pain or drainage over the weekend she should go to the acute or urgent care or contact the on-call resident.  In that case she may need to be evaluated.  She is also aware that we can recheck things in [place] on Tuesday with me or very likely on Monday as well.  She expresses understanding.

## 2018-02-10 NOTE — Unmapped (Signed)
I called Ms. Pociask to check on her ear wound and find out if she wanted me to evaluate the biopsy site tomorrow.  Wound is stable to slightly improved and she is able to avoid it by adjusting her pillow and sleeping on the other side.  She defers coming in for a wound check tomorrow for now, let her know that she can let me know should any issues arise.  She has a donut pillow coming in the mail tomorrow.

## 2018-02-16 NOTE — Unmapped (Signed)
[facility] Pharmacy Refill and Clinical Coordination Note  Medication(s): Emgality 120mg /ml    Olivia Stout, DOB: Sep 26, 1950  Phone: [phone] (home) [phone] (work), Alternate phone contact: N/A  Shipping address: [address]  [place] [address]  Phone or address changes today?: No  All above HIPAA information verified.  Insurance changes? No    Completed refill and clinical call assessment today to schedule patient's medication shipment from the [facility] [phone]).      MEDICATION RECONCILIATION    Confirmed the medication and dosage are correct and have not changed: Yes, regimen is correct and unchanged.    Were there any changes to your medication(s) in the past month:  No, there are no changes reported at this time.    ADHERENCE    Is this medicine transplant or covered by Medicare Part B? No.    Did you miss any doses in the past 4 weeks? No missed doses reported.  Adherence counseling provided? Not needed     SIDE EFFECT MANAGEMENT    Are you tolerating your medication?:  Olivia Stout reports tolerating the medication.  Side effect management discussed: None      Therapy is appropriate and should be continued.    Evidence of clinical benefit: See Epic note from 01/26/18      FINANCIAL/SHIPPING    Delivery Scheduled: Yes, Expected medication delivery date: 02/17/18 - delivering to clinic per pt and MD request     Medication will be delivered via [facility] [place] (Olivia Stout) [facility] clinic to the temporary address in [place].    Additional medications refilled: No additional medications/refills needed at this time.    The patient will receive a drug information handout for each medication shipped and additional FDA Medication Guides as required.      Olivia Stout did not have any additional questions at this time.    Delivery address confirmed in Epic.     We will follow up with patient monthly for standard refill processing and delivery.      Thank you, Olivia Stout   [facility] Specialty Pharmacist

## 2018-02-17 MED FILL — EMGALITY PEN 120 MG/ML SUBCUTANEOUS PEN INJECTOR: SUBCUTANEOUS | 30 days supply | Qty: 1 | Fill #0

## 2018-02-17 MED FILL — EMGALITY PEN 120 MG/ML SUBCUTANEOUS PEN INJECTOR: 30 days supply | Qty: 1 | Fill #0 | Status: AC

## 2018-02-17 NOTE — Unmapped (Signed)
Received patient's [employer]

## 2018-02-18 MED ORDER — CLOTRIMAZOLE 1 % TOPICAL CREAM
Freq: Two times a day (BID) | TOPICAL | 0 refills | 0 days | Status: CP
Start: 2018-02-18 — End: 2019-02-18

## 2018-02-23 ENCOUNTER — Ambulatory Visit: Admit: 2018-02-23 | Discharge: 2018-02-24 | Payer: MEDICARE | Attending: Neurology | Primary: Neurology

## 2018-02-23 DIAGNOSIS — H61001 Unspecified perichondritis of right external ear: Secondary | ICD-10-CM

## 2018-02-23 DIAGNOSIS — G43709 Chronic migraine without aura, not intractable, without status migrainosus: Principal | ICD-10-CM

## 2018-02-23 NOTE — Unmapped (Signed)
Please continue to track your migraines.       Warmest Regards,    Dr. Hollie Beach  Board Certified: Adult Neurology  [place] Division of General Medicine and Clinical Epidemiology  [place], [place]      It has been my pleasure to participate in your care.   You may reach me via MyChart, phone or fax.     Please note that while I will try to answer messages in a timely manner, all urgent issues should be directed to your PCP.       Helpful Contact Information :  ?? Appointments/ Internal Medicine Clinic: [phone]    ?? HIPPA Compliant Facsimile: [phone]  ?? Same Day Clinic:  [phone].  8am-5pm Monday through Friday   ?? After Hours:  [phone].  A nurse will answer and can help you decide what kind of medication attention you need.   ?? [facility] Urgent Care:  [phone].  If you are sick, but not injured:  9am-8pm Mon-Sun.  [address], [place], [place] off of [address].  All walk-ins accepted.    ?? [facility] Urgent [facility] at the [facility]:  [phone].  7am-9pm Mon-Fri; 12pm-5pm [address]  [address], [place] [address].  All walk-ins accepted.    ??    Thank you for choosing [facility].

## 2018-02-23 NOTE — Unmapped (Signed)
NEUROLOGY REPORT  [facility]    Date: February 23, 2018  Patient Name: Olivia Stout  MRN: [id number]  PCP:  Lolita Lenz  Time in with patient: 12:43 PM  Time out with patient: 1: 15 PM  Prior visit with this neurologist: January 26, 2018; 12/17/2017; 08/22/2017; 06/24/2017; 05/30/2017; 05/27/2017; 04/29/2017  Assessment and Plan/ Clinical Decision Making          1. Chronic migraine  67 yo with chronic migraine- now on month #2 of Galcanezumab- unable to self-administer.   Continue to keep HA log- showed her how to use Migraine Buddy.   Tried Botox- no better  Addressed MOH.   She has not tolerated rizatriptan or sumatriptan due to side effects. No further triptans offered.  At this time would like her to consider avoiding Naprosyn and other pain medications.  Consider eTNS or VNS for migraine- not covered by insurance- out of pocket cost is high- given her information about the site.   Headache calendar importance reviewed. She would like to use Migraine Buddy.     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     HEALTH EDUCATION/HEALTH LITERACY: PATIENT EDUCATION was provided. Reviewed the differential diagnosis, plan of care in detail, as well as other elements as detailed above. The benefits and risks of each of the above procedures, benefits and alternative options were reviewed with the patient.   Medication Management: There is a high risk for medication side effects, which will require ongoing monitoring and dose adjustment. Medication side effect profile was reviewed in detail with patient. Medication safety and drug interactions reviewed on ERx.   Patient Counselling: All the patient's questions and concerns were addressed to their stated satisfaction .    Barriers to Care: Multiple factors including depression, comorbid medical conditions, ongoing biological therapy, cost of medications and cost of care, access to care.Very anxious.   Total face-to-face time included: 15  minutes > 50 % in direct consultation reviewing details of history, examination and data findings, discussing my clinical reasoning and clinical impression, as well as a review of my recommendations for a plan of treatment as documented above. Additional time was provided to address alternative options for care, health literacy regarding the differential diagnosis, testing and medication options, the benefits of current plan as well as an opportunity to address all the patient's questions and concerns to their reported satisfaction. A written summary was provided to the patient at the time of the visit, as well as instructions on how to access My Chart. My contact information was provided along with instructions on how to reach the administrative office and the clinic.     Return in about 1 month (around 03/27/2018), or at 2 PM please, for for Emgality dosing.         Subjective:          SOURCE: The history is obtained from the patient who appears to be a vague historian. She has been communicating with me via MyChart. She has a hard time answering questions even with direct questions. Tangential in answering questions. Additional history is obtained from review of available medical records. Open-ended and close-ended questions, as well as questionnaire data and review of EPIC chart was used to obtain details of the history.    History of Present Illness:     Olivia Stout is a 67 y.o. right handed female  seen in followup for evaluation of Follow-up      CURRENT VISIT: 02/23/2018  Here for Emgality #2. Feels a little better- ear lesion is healing slowly. Had biopsy.   No new problems.   Headache Calendar reviewed; [email or web address]       She has a strong family history of migraines she has 1 daughter with headaches a sister with headaches and her father had severe headaches. She has no history of heart disease.  Other studies/data: normal brain MRI/MRA for same indication in 2006 at [facility]- results reviewed.     Sleep Study: no  Imaging: yes    TRAVEL HISTORY: [place]  CHILDHOOD HISTORY: Headaches in her teens.    Past Medical History:   Diagnosis Date   ??? Allergy desensitization therapy    ??? Anxiety    ??? Arthritis    ??? Back pain, chronic 2003    pinched nerve   ??? Depression    ??? Disease of thyroid gland    ??? Dry eyes     Had permanent cautery lower canula, tried plugs  and partial but caused waatering.   ??? Dysphagia    ??? Dysplastic nevi    ??? Eye trauma     wood chip with corneal abrasion right eye.  metal right eye removed also with abrasion   ??? Fibromyalgia    ??? Fibromyalgia, primary    ??? GERD (gastroesophageal reflux disease)    ??? High cholesterol    ??? History of gastric ulcer    ??? History of underactive thyroid    ??? Hypertension     watching   ??? IBS (irritable bowel syndrome)    ??? Long term prescription benzodiazepine use 06/29/2017   ??? Lupus (CMS-HCC)    ??? Lymphoma (CMS-HCC)     in remission; s/p chemotherapy - NON HODGKINS   ??? Major depressive disorder    ??? Mixed stress and urge urinary incontinence 02/08/2013   ??? Sjoegren syndrome    ??? Urinary, incontinence, stress female    ??? Vaginal prolapse          Past Surgical History:   Procedure Laterality Date   ??? BACK SURGERY  07/27/2015   ??? ESOPHAGEAL DILATION     ??? FRACTURE SURGERY      left forearm   ??? HYSTERECTOMY     ??? KNEE SURGERY     ??? LAPAROSCOPIC NISSEN FUNDOPLICATION     ??? PR ANAL PRESSURE RECORD Left 12/01/2012    Procedure: ANORECTAL MANOMETRY;  Surgeon: None None;  [facility];  Service: Gastroenterology   ??? PR BREATH HYDROGEN TEST N/A 12/01/2012    Procedure: BREATH HYDROGEN TEST;  Surgeon: None None;  [facility];  Service: Gastroenterology   ??? SPINE SURGERY     ??? TAH and bladder surgery      partial   ??? TONSILLECTOMY         Social History     Patient does not qualify to have social determinant information on file (likely too young).   Social History Narrative   ??? Not on file       Social History     Tobacco Use   Smoking Status Never Smoker   Smokeless Tobacco Never Used         reports no history of alcohol use.      Family History   Problem Relation Age of Onset   ??? Diabetes Mother    ???  Crohn's disease Mother    ??? Emphysema Mother    ??? Glaucoma Mother    ??? Asthma Mother    ??? Osteoporosis Mother    ??? Parkinsonism Mother    ??? Stroke Father    ??? Heart disease Father    ??? Cancer Father    ??? Migraines Father    ??? Hypertension Sister    ??? Migraines Sister    ??? Migraines Daughter    ??? No Known Problems Son    ??? No Known Problems Son    ??? ADD / ADHD Neg Hx    ??? Alcohol abuse Neg Hx    ??? Anxiety disorder Neg Hx    ??? Bipolar disorder Neg Hx    ??? Dementia Neg Hx    ??? Depression Neg Hx    ??? Drug abuse Neg Hx    ??? OCD Neg Hx    ??? Paranoid behavior Neg Hx    ??? Physical abuse Neg Hx    ??? Schizophrenia Neg Hx    ??? Sexual abuse Neg Hx    ??? Seizures Neg Hx    ??? Macular degeneration Neg Hx    ??? Blindness Neg Hx          Allergies   Allergen Reactions   ??? Amoxicillin Swelling     Sweating and swelling in throat      ??? Ketorolac Shortness Of Breath   ??? Sulfa (Sulfonamide Antibiotics) Other (See Comments)     welts in my mouth   ??? Zocor [Simvastatin] Other (See Comments)     Had issues w/ bearing weight and pain in legs. *Cannot have any statins at all.*     ??? Statins-Hmg-Coa Reductase Inhibitors Other (See Comments)     Leg paralysis  Leg paralysis       Current Outpatient Medications   Medication Sig Dispense Refill   ??? acetaminophen (TYLENOL) 500 MG tablet Take 500 mg by mouth every six (6) hours as needed for pain.     ??? albuterol (PROVENTIL HFA;VENTOLIN HFA) 90 mcg/actuation inhaler Inhale 2 puffs. As needed     ??? ammonium lactate (AMLACTIN) 12 % cream Apply topically once daily. 400 g 6   ??? ammonium lactate (LAC-HYDRIN) 12 % lotion As needed     ??? belimumab (BENLYSTA) 400 mg SolR Infuse 720 mg into a venous catheter every thirty (30) days.     ??? cholecalciferol, vitamin D3, (VITAMIN D3) 1,000 unit capsule Take 1,000 Units by mouth daily.     ??? clonazePAM (KLONOPIN) 1 MG tablet Take one tablet for sleep as needed at bed time 30 tablet 1   ??? clotrimazole (LOTRIMIN) 1 % cream Apply topically Two (2) times a day. 30 g 0   ??? cyanocobalamin 1000 MCG tablet Take 1,000 mcg by mouth daily.     ??? diclofenac sodium (VOLTAREN) 1 % gel Apply 2 g topically Four (4) times a day. 100 g 0   ??? DULoxetine (CYMBALTA) 60 MG capsule Take 1 capsule (60 mg total) by mouth daily. 90 capsule 1   ??? empty container Misc Use as directed 1 each PRN   ??? EPINEPHrine (EPIPEN) 0.3 mg/0.3 mL injection Inject 0.3 mL (0.3 mg total) into the muscle Take as directed. Note:carry at all times 1 each 3   ??? estradiol (ESTRACE) 0.01 % (0.1 mg/gram) vaginal cream Insert into the vagina.     ??? fluticasone propionate (FLONASE) 50 mcg/actuation nasal spray 2 sprays by Each Nare  route Two (2) times a day. 16 mL prn   ??? gabapentin (NEURONTIN) 100 MG capsule Take 100 mg (1 cap) each morning and 300 mg (3 caps) each evening 360 capsule 3   ??? galcanezumab-gnlm (EMGALITY) 120 mg/mL injection Inject 120 mg under the skin every thirty (30) days. 1 mL 11   ??? hydroCHLOROthiazide (HYDRODIURIL) 25 MG tablet Take 12.5 mg by mouth daily. TAKE 0.5 TABLETS (12.5 MG TOTAL) BY MOUTH DAILY.  3   ??? levocetirizine (XYZAL) 5 MG tablet 1 daily prn allergy 30 tablet 2   ??? levothyroxine (SYNTHROID, LEVOTHROID) 50 MCG tablet Take 1 tablet (50 mcg total) by mouth daily. 90 tablet 1   ??? lidocaine (LIDODERM) 5 % patch Place 1 patch on the skin daily as needed (12 hours on/12 hours off). Apply to affected area for 12 hours only each day (then remove patch) 60 patch 5   ??? metoprolol succinate (TOPROL XL) 25 MG 24 hr tablet Take 1 tablet (25 mg total) by mouth daily. 30 tablet 11   ??? multivitamin (TAB-A-VITE/THERAGRAN) per tablet Take 1 tablet by mouth daily.     ??? naproxen (NAPROSYN) 500 MG tablet Take 1 tablet (500 mg total) by mouth 2 (two) times a day with meals. TAKE 1 TABLET (500 MG TOTAL) BY MOUTH 2 (TWO) TIMES A DAY WITH MEALS. 60 tablet 11   ??? omega 3-dha-epa-fish oil 900 mg-360 mg- 455 mg-1,000 mg cap      ??? omeprazole (PRILOSEC) 40 MG capsule Take 1 capsule (40 mg total) by mouth daily. 90 capsule 3   ??? pyridoxine, vitamin B6, (B-6) 100 MG tablet Take 100 mg by mouth daily.     ??? SPONIX ARTHRITIS-MUSCLE PAIN 4-10-0.035 % srlo      ??? tiZANidine (ZANAFLEX) 2 MG tablet Take 0.5 tablets (1 mg total) by mouth nightly as needed (back pain). for up to 30 doses Tapering off. Plan to discontinue after 6-8 weeks. 30 tablet 1   ??? traMADol (ULTRAM) 50 mg tablet Take 0.5 tablets (25 mg total) by mouth daily as needed for pain or pain,severe (7-10). 15 tablet 3   ??? traZODone (DESYREL) 50 MG tablet Take 1 tablet (50 mg total) by mouth nightly. 90 tablet 1     No current facility-administered medications for this visit.             Objective:        GENERAL PHYSICAL EXAM:    Vital Signs: Reviewed today.   BP 169/80  - Pulse 76  - Resp 16  - Ht 157.5 cm (5' 2)  - Wt 72.6 kg (160 lb 0.9 oz)  - BMI 29.27 kg/m??   Estimated body mass index is 29.27 kg/m?? as calculated from the following:    Height as of this encounter: 157.5 cm (5' 2).    Weight as of this encounter: 72.6 kg (160 lb 0.9 oz).  Facility age limit for growth percentiles is 20 years.  BP meds taken in the evening.     Wt Readings from Last 8 Encounters:   02/23/18 72.6 kg (160 lb 0.9 oz)   01/28/18 72.6 kg (160 lb)   01/23/18 72.3 kg (159 lb 6.4 oz)   01/21/18 71.6 kg (157 lb 14.4 oz)   12/31/17 71.7 kg (158 lb)   12/31/17 71.9 kg (158 lb 8.2 oz)   12/17/17 71.1 kg (156 lb 12 oz)   12/10/17 71.1 kg (156 lb 11.2 oz)       General  Appearance: The patient was well-groomed and neat.   Head:  Atraumatic, normocephalic.   Right ear examined- clean dressing in situ.     NEUROLOGICAL EXAMINATION   Mental Status  The patient is alert and oriented to time, place and person.   Attention adequate throughout the examination.   Speech and language were normal at the bedside.    Cranial nerves  Intact. Mild ptosis bilaterally- unchanged.      Motor / Musculoskeletal       Gait: Normal gait and stance.        Diagnostic Studies and Review of Records:     DATA   I have independently reviewed the studies as noted.   1. Medical Records: reviewed-including recent Derm/Dermpath;  prior neurology visit- EMG with Dr. Vickki Muff in 2011. CONCLUSION- Normal study. There is no evidence for a mononeuropathy or cervical radiculopathy.    2. Radiology: Results reviewed and report reviewed again today, as well as MRI.     Mra Head Wo Contrast  Result Date: 05/07/2017  EXAM: Magnetic resonance imaging, brain without and with contrast material, AND Magnetic resonance angiography, head. DATE: 05/07/2017 10:59 AM ACCESSION: [id number] Bethann Humble [id number] UN DICTATED: 05/07/2017 11:11 AM INTERPRETATION LOCATION: [facility]     CLINICAL INDICATION: 67 years old Female with G43.709-Chronic migraine      COMPARISON: 02-27-05 MR     TECHNIQUE: Multiplanar, multisequence MR imaging of the brain was performed without and with I.V. contrast.  Time of flight MRA of intracranial arterial circulation was also performed.     FINDINGS: No focal parenchymal signal abnormality.     No midline shift or mass lesion. No intracranial hemorrhage or extra-axial fluid collection.     No evidence of acute infarct. No DWI signal abnormality.     No abnormal enhancement.     Intracranial MRA does not demonstrate any aneurysms or high grade stenoses.   --Normal MRI/MRA of the brain.      Mri Brain W Wo Contrast  Result Date: 05/07/2017  EXAM: Magnetic resonance imaging, brain without and with contrast material, AND Magnetic resonance angiography, head. DATE: 05/07/2017 10:59 AM ACCESSION: [id number] Bethann Humble [id number] UN DICTATED: 05/07/2017 11:11 AM INTERPRETATION LOCATION: [facility]     CLINICAL INDICATION: 67 years old Female with G43.709-Chronic migraine      COMPARISON: 02-27-05 MR     TECHNIQUE: Multiplanar, multisequence MR imaging of the brain was performed without and with I.V. contrast.  Time of flight MRA of intracranial arterial circulation was also performed.     FINDINGS: No focal parenchymal signal abnormality.     No midline shift or mass lesion. No intracranial hemorrhage or extra-axial fluid collection.     No evidence of acute infarct. No DWI signal abnormality.     No abnormal enhancement.     Intracranial MRA does not demonstrate any aneurysms or high grade stenoses.   --Normal MRI/MRA of the brain.      3.Labs: reviewed , including B12.   Lab Results   Component Value Date    WBC 6.3 12/31/2017    WBC 6.3 12/31/2017    HGB 14.7 12/31/2017    HGB 14.8 12/31/2017    HCT 44.4 12/31/2017    HCT 44.9 12/31/2017    PLT 305 12/31/2017    PLT 314 12/31/2017       Lab Results   Component Value Date    NA 137 12/31/2017    K 5.0 12/31/2017    CL 99 12/31/2017    CO2 28.0 12/31/2017  BUN 18 12/31/2017    BUN 18 12/31/2017    CREATININE 0.72 12/31/2017    CREATININE 0.72 12/31/2017    GLU 83 12/31/2017    CALCIUM 10.6 (H) 12/31/2017       Lab Results   Component Value Date    BILITOT 0.8 12/31/2017    PROT 8.1 12/31/2017    ALBUMIN 4.9 12/31/2017    ALT 38 12/31/2017    AST 37 12/31/2017    ALKPHOS 74 12/31/2017    GGT 42 12/31/2011     Dr. Hollie Beach    CC: Lolita Lenz, MD    Dictated using voice-activated software. Typographical errors may exist.

## 2018-02-25 ENCOUNTER — Ambulatory Visit: Admit: 2018-02-25 | Discharge: 2018-02-25 | Payer: MEDICARE

## 2018-02-25 ENCOUNTER — Ambulatory Visit: Admit: 2018-02-25 | Discharge: 2018-02-26 | Payer: MEDICARE

## 2018-02-25 ENCOUNTER — Institutional Professional Consult (permissible substitution): Admit: 2018-02-25 | Discharge: 2018-02-25 | Payer: MEDICARE

## 2018-02-25 DIAGNOSIS — J3089 Other allergic rhinitis: Principal | ICD-10-CM

## 2018-02-25 DIAGNOSIS — M3219 Other organ or system involvement in systemic lupus erythematosus: Principal | ICD-10-CM

## 2018-02-25 MED ORDER — CLONAZEPAM 1 MG TABLET
ORAL_TABLET | 1 refills | 0 days | Status: CP
Start: 2018-02-25 — End: 2018-04-28

## 2018-02-25 NOTE — Unmapped (Signed)
1145??Patient in today for Benlysta infusion. Patient has no s/s of infection. No issues from the last infusion.   1245??Benlysta started ,patient instructed to use call bell /call nurse for any s/s of unsual symptoms during infusion. Patient educated on possible s/s of reaction,such as chestpain ,itching,shortness of breath lightheadedness and any kind of discomfort. Patient verbalized understanding...  1409??Infusion completed and well tolerated.  1500??Benlysta 730.4??mg/250??ml??NS infused over 1hr and??24??min.??per protocol . Pt alert and oriented, offered no complaints during infusion. IV flushed with 10ml

## 2018-03-04 ENCOUNTER — Ambulatory Visit
Admit: 2018-03-04 | Discharge: 2018-03-05 | Payer: MEDICARE | Attending: Psychiatric/Mental Health | Primary: Psychiatric/Mental Health

## 2018-03-04 DIAGNOSIS — F3341 Major depressive disorder, recurrent, in partial remission: Principal | ICD-10-CM

## 2018-03-04 DIAGNOSIS — F418 Other specified anxiety disorders: Secondary | ICD-10-CM

## 2018-03-04 DIAGNOSIS — Z9149 Other personal history of psychological trauma, not elsewhere classified: Secondary | ICD-10-CM

## 2018-03-04 DIAGNOSIS — M797 Fibromyalgia: Secondary | ICD-10-CM

## 2018-03-04 DIAGNOSIS — F4322 Adjustment disorder with anxiety: Secondary | ICD-10-CM

## 2018-03-04 DIAGNOSIS — Z79899 Other long term (current) drug therapy: Secondary | ICD-10-CM

## 2018-03-04 NOTE — Unmapped (Signed)
Follow-up instructions:  -- Please continue taking your medications as prescribed for your mental health.   -- Do not make changes to your medications, including taking more or less than prescribed, unless under the supervision of your provider. Be aware that some medications may make you feel worse if abruptly stopped.  -- Please refrain from using illicit substances, as these can affect your mood and could cause anxiety or other concerning symptoms.   -- Seek further medical care for any increase in symptoms or new symptoms such as thoughts of wanting to hurt yourself or hurt others.     Contact info:  Life-threatening emergencies: call 911 or go to the nearest ER for medical or psychiatric attention.     Issues that need urgent attention but are not life threatening: call the clinic outpatient nurses' line at [phone] for assistance.     Non-urgent routine concerns, questions, and refill requests: please leave me a message through MyChart and I will get back to you within 2 business days.     Regarding appointments:  - If you need to cancel your appointment, we ask that you call [phone] at least 24 hours before your scheduled appointment.  - If for any reason you arrive 15 minutes later than your scheduled appointment time, you may not be seen and your visit may be rescheduled.  - Please remember that we will not automatically reschedule missed appointments.  - If you miss two (2) appointments without letting us know in advance, you will likely be referred to a provider in your community.  - We will do our best to be on time. Sometimes an emergency will arise that might cause your clinician to be late. We will try to inform you of this when you check in for your appointment. If you wait more than 15 minutes past your appointment time without such notice, please speak with the front desk staff.    In the event of bad weather, the clinic staff will attempt to contact you, should your appointment need to be rescheduled. Additionally, you can call the Patient Weather Line [phone] for system-wide clinic status    For more information and reminders regarding clinic policies (these were provided when you were admitted to the clinic), please ask the front desk.    Sleep Instructions  Ideally, we would awaken restored and refreshed in the morning with adequate energy to last throughout the day. If you find yourself repeatedly pressing the snooze button, or experiencing fatigue or drowsiness in the afternoon, this may indicate your sleep is not fully restorative. Causes can include insufficient time in bed, environmental disturbances, or even a sleep disorder.    Specific steps to improve your sleep:  - Set regular sleep hours that do not vary more than 30 minutes from weekday to weekend, and allow for 8 hours of time in bed.   - Completely avoid TV or reading in bed, and do not lie down (in bed or on a couch) to watch TV or read during the day.   - To reduce the need to urinate at night, limit liquid to less than 6 oz within 2 hours of bedtime.  - Do not nap during the day  - Do not sleep with pets or children  - Avoid caffeine and alcohol.   - If you find that worries or list making are keeping you awake at night, dedicate 30 minutes in the evening (not immediately before bed) for worry time during which  you should make lists, and try to write down all of your anxieties or thoughts.     Progressive Relaxation Technique      Being relaxed helps promote sleep onset.  These exercises will help you relax and improve your sleep.  There is no perfect way to do these exercises so allow yourself to relax and enjoy.  It???s recommended you do these exercises nightly for at least a couple of weeks until the ability to relax easily is learned.    There are two methods:  For the first, muscles are relaxed progressively from feet to head.  While lying on your back, mentally free the muscles of toes, feet, and ankles to unwind. Wait until there is a let down sensation and your feet seem to rest heavier on the mattress.  When the feet are relaxed, continue the loosening up process with the lower legs, knees, and thighs, and then upward along your body.  At each region, try to will your muscles to relax and wait until you feel the heaviness of their loss of muscle tension.  While doing this, remember to breathe slowly in and out through your nose.  The exercise may be repeated three or four times until the body is sufficiently relaxed.    For the second method, a muscle group is constricted and tensed until there is a mild amount of discomfort; then the muscles are relaxed and a feeling of relief follows.  Again, lie comfortably on your back.  Press your feet firmly into the mattress for a slow count of 5, then relax until you feel the heaviness sensation, then move up the body, the lower legs, the knees, the thighs, buttocks, back, abdomen, chest, arms, and the head.  Do the exercise twice at each location.  You can also try firm flexion at the ankles, the knees, elbows, and neck.  Again, hold at each location for a slow count of 5, and then relax.  Finish by clenching both fists and then unclench.  Remember to breathe deeply through your nose focusing on the release of the out breath.    There are several methods of relaxation.  Remember the goal of these exercises is to help you relax.  Some people find that these exercises allow them to enter sleep.  You should practice these methods every day for at least  a month, in the evening, and away from other distractions.  You may even use these exercises during the day when you feel stressed.  Enjoy your ability to relax.
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
## 2018-03-16 MED ORDER — TRAZODONE 50 MG TABLET
ORAL_TABLET | Freq: Every evening | ORAL | 1 refills | 0.00000 days | Status: CP
Start: 2018-03-16 — End: 2018-09-22

## 2018-03-20 NOTE — Unmapped (Signed)
[facility] Refill Coordination Note    Specialty Medication(s) to be Shipped:   General Specialty: emgality    Other medication(s) to be shipped: N/A     Olivia Stout, DOB: 01-Oct-1950  Phone: [phone] (home) [phone] (work)      All above HIPAA information was verified with patient.     Completed refill call assessment today to schedule patient's medication shipment from the [facility] [phone]).       Specialty medication(s) and dose(s) confirmed: Regimen is correct and unchanged.   Changes to medications: Olivia Stout reports no changes reported at this time.  Changes to insurance: No  Questions for the pharmacist: No    The patient will receive a drug information handout for each medication shipped and additional FDA Medication Guides as required.      DISEASE/MEDICATION-SPECIFIC INFORMATION        N/A    ADHERENCE     Medication Adherence    Patient reported X missed doses in the last month:  0  Specialty Medication:  emagality  Patient is on additional specialty medications:  No  Patient is on more than two specialty medications:  No  Any gaps in refill history greater than 2 weeks in the last 3 months:  no                      Refill Coordination    Has the Patients' Contact Information Changed:  No  Is the Shipping Address Different:  No         MEDICARE PART B DOCUMENTATION     emgality: Patient has 0 on hand.    SHIPPING     Shipping address confirmed in Epic.     Delivery Scheduled: Yes, Expected medication delivery date: 03/23/18 via UPS or courier.     Medication will be delivered via Clinic Courier - 03/23/18 clinic to the temporary address in Epic WAM.    Olivia Stout   [facility] Specialty Technician

## 2018-03-20 NOTE — Unmapped (Signed)
PA Approved for Emgality through 03/25/2019.

## 2018-03-20 NOTE — Unmapped (Signed)
PA Received for [facility] via CMM. Routed to Dr. Rulon Eisenmenger for completion.     Key: [id number]

## 2018-03-20 NOTE — Unmapped (Signed)
[facility] Refill Coordination Note  Medication: Emgality 120mg /ml    Unable to reach patient to schedule shipment for medication being filled at [facility]. Left voicemail on phone.  As this is the 3rd unsuccessful attempt to reach the patient, no additional phone call attempts will be made at this time.      Phone numbers attempted: [phone]  Dates called: 03/11/18; 03/16/18; 03/20/18  Last scheduled delivery: 02/17/18    Please call the [facility] at [phone] (option [phone]) should you have any further questions.      Thanks,  [facility] Specialty Team

## 2018-03-23 MED FILL — EMGALITY PEN 120 MG/ML SUBCUTANEOUS PEN INJECTOR: SUBCUTANEOUS | 30 days supply | Qty: 1 | Fill #1

## 2018-03-23 MED FILL — EMGALITY PEN 120 MG/ML SUBCUTANEOUS PEN INJECTOR: 30 days supply | Qty: 1 | Fill #1 | Status: AC

## 2018-03-27 ENCOUNTER — Ambulatory Visit: Admit: 2018-03-27 | Discharge: 2018-03-28 | Payer: MEDICARE | Attending: Neurology | Primary: Neurology

## 2018-03-27 DIAGNOSIS — G43119 Migraine with aura, intractable, without status migrainosus: Secondary | ICD-10-CM

## 2018-03-27 DIAGNOSIS — G43709 Chronic migraine without aura, not intractable, without status migrainosus: Principal | ICD-10-CM

## 2018-03-27 NOTE — Unmapped (Signed)
[facility] NEUROLOGY REPORT    Date: March 27, 2018  Patient Name: Olivia Stout  MRN: [id number]  PCP:  Olivia Stout  Time in with patient:  2 PM  Time out with patient: 2:25 PM  Prior visit with this neurologist: February 23, 2018; January 26, 2018; 12/17/2017; 08/22/2017; 06/24/2017; 05/30/2017; 05/27/2017; 04/29/2017  Assessment and Plan/ Clinical Decision Making          1. Chronic migraine  68 yo with chronic migraine- now on month #3 of Galcanezumab- unable to self-administer due to anxiety and needle phobia.   Tried Botox- no improvement subjectively.   Has initiated care with [facility] Psychiat[facility] - Note reviewed: plan includes: CONTINUE??Duoxetine 60 mg QD??to target Mood, anxiety and chronic body pain/fibromyalgia; CLONAZEPAM and Trazodone.     PLAN:   ?? Continue to keep HA log- showed her how to use Migraine Buddy.   ?? Addressed MOH. At this time would like her to consider avoiding Naprosyn and other pain medications. Consider trying to wean off Tramadol, tizanidine.   ?? She has not tolerated rizatriptan or sumatriptan due to side effects. No further triptans offered.  ?? Consider eTNS or VNS for migraine- not covered by insurance- out of pocket cost is high- given her information about the site.   ?? Discussed Lasmiditan- dizziness is not a desirable side effect.     - galcanezumab-gnlm (EMGALITY) injection 120 mg      HEALTH EDUCATION/HEALTH LITERACY: PATIENT EDUCATION was provided. Reviewed the differential diagnosis, plan of care in detail, as well as other elements as detailed above. The benefits and risks of each of the above procedures, benefits and alternative options were reviewed with the patient.   Medication Management: There is a high risk for medication side effects, which will require ongoing monitoring and dose adjustment. Medication side effect profile was reviewed in detail with patient. Medication safety and drug interactions reviewed on ERx.   Patient Counselling: All the patient's questions and concerns were addressed to their stated satisfaction .    Barriers to Care: Multiple factors including depression, comorbid medical conditions, ongoing biological therapy, cost of medications and cost of care, access to care.Very anxious.   Total face-to-face time included: 25  minutes > 50 % in direct consultation reviewing details of history, examination and data findings, discussing my clinical reasoning and clinical impression, as well as a review of my recommendations for a plan of treatment as documented above. Additional time was provided to address alternative options for care, health literacy regarding the differential diagnosis, testing and medication options, the benefits of current plan as well as an opportunity to address all the patient's questions and concerns to their reported satisfaction. A written summary was provided to the patient at the time of the visit, as well as instructions on how to access My Chart. My contact information was provided along with instructions on how to reach the administrative office and the clinic.     Return in about 4 weeks (around 04/24/2018) for Return for re-evaluation..         Subjective:          SOURCE: The history is obtained from the patient who appears to be a vague historian. She has been communicating with me via MyChart. She has a hard time answering questions even with direct questions. Tangential in answering questions. Additional history is obtained from review of available medical records. Open-ended and close-ended questions, as well as questionnaire data and review of EPIC chart was used  to obtain details of the history.    History of Present Illness:     Ms. Olivia Stout is a 68 y.o. right handed female seen in followup for evaluation of Headache (Followup and Emgality Injection)      CURRENT VISIT: March 27, 2018   Here for [employer] #3.   Headache Calendar reviewed on Migraine Buddy- unable to send it- Having a headache approx QOD- a huge improvement from daily. Feels that the medication does best immediately after and worse closer to next injection.   No new issues. Doing well overall.   NO site reactions.   Has Eye appt pending.       PRIOR VISIT: 02/23/2018  Here for Emgality #2. Feels a little better- ear lesion is healing slowly. Had biopsy.   No new problems.   Headache Calendar reviewed; [email or web address]     She has a strong family history of migraines she has 1 daughter with headaches a sister with headaches and her father had severe headaches.  She has no history of heart disease.  Other studies/data: normal brain MRI/MRA for same indication in 2006 at [facility]- results reviewed.     Sleep Study: no  Imaging: yes    TRAVEL HISTORY: [place]  CHILDHOOD HISTORY: Headaches in her teens.    Past Medical History:   Diagnosis Date   ??? Allergy desensitization therapy    ??? Anxiety    ??? Arthritis    ??? Back pain, chronic 2003    pinched nerve   ??? Depression    ??? Disease of thyroid gland    ??? Dry eyes     Had permanent cautery lower canula, tried plugs  and partial but caused waatering.   ??? Dysphagia    ??? Dysplastic nevi    ??? Eye trauma     wood chip with corneal abrasion right eye.  metal right eye removed also with abrasion   ??? Fibromyalgia    ??? Fibromyalgia, primary    ??? GERD (gastroesophageal reflux disease)    ??? High cholesterol    ??? History of gastric ulcer    ??? History of underactive thyroid    ??? Hypertension     watching   ??? IBS (irritable bowel syndrome)    ??? Long term prescription benzodiazepine use 06/29/2017   ??? Lupus (CMS-HCC)    ??? Lymphoma (CMS-HCC)     in remission; s/p chemotherapy - NON HODGKINS   ??? Major depressive disorder    ??? Mixed stress and urge urinary incontinence 02/08/2013   ??? Sjoegren syndrome    ??? Urinary, incontinence, stress female    ??? Vaginal prolapse          Past Surgical History:   Procedure Laterality Date   ??? BACK SURGERY  07/27/2015   ??? ESOPHAGEAL DILATION     ??? FRACTURE SURGERY      left forearm   ??? HYSTERECTOMY     ??? KNEE SURGERY     ??? LAPAROSCOPIC NISSEN FUNDOPLICATION     ??? PR ANAL PRESSURE RECORD Left 12/01/2012    Procedure: ANORECTAL MANOMETRY;  Surgeon: None None;  [facility];  Service: Gastroenterology   ??? PR BREATH HYDROGEN TEST N/A 12/01/2012    Procedure: BREATH HYDROGEN TEST;  Surgeon: None None;  [facility]: [facility];  Service: Gastroenterology   ??? SPINE SURGERY     ??? TAH and bladder surgery      partial   ???  TONSILLECTOMY         Social History     Patient does not qualify to have social determinant information on file (likely too young).   Social History Narrative   ??? Not on file       Social History     Tobacco Use   Smoking Status Never Smoker   Smokeless Tobacco Never Used         reports no history of alcohol use.      Family History   Problem Relation Age of Onset   ??? Diabetes Mother    ??? Crohn's disease Mother    ??? Emphysema Mother    ??? Glaucoma Mother    ??? Asthma Mother    ??? Osteoporosis Mother    ??? Parkinsonism Mother    ??? Stroke Father    ??? Heart disease Father    ??? Cancer Father    ??? Migraines Father    ??? Hypertension Sister    ??? Migraines Sister    ??? Migraines Daughter    ??? No Known Problems Son    ??? No Known Problems Son    ??? ADD / ADHD Neg Hx    ??? Alcohol abuse Neg Hx    ??? Anxiety disorder Neg Hx    ??? Bipolar disorder Neg Hx    ??? Dementia Neg Hx    ??? Depression Neg Hx    ??? Drug abuse Neg Hx    ??? OCD Neg Hx    ??? Paranoid behavior Neg Hx    ??? Physical abuse Neg Hx    ??? Schizophrenia Neg Hx    ??? Sexual abuse Neg Hx    ??? Seizures Neg Hx    ??? Macular degeneration Neg Hx    ??? Blindness Neg Hx          Allergies   Allergen Reactions   ??? Amoxicillin Swelling     Sweating and swelling in throat      ??? Ketorolac Shortness Of Breath   ??? Sulfa (Sulfonamide Antibiotics) Other (See Comments)     welts in my mouth   ??? Zocor [Simvastatin] Other (See Comments)     Had issues w/ bearing weight and pain in legs. *Cannot have any statins at all.*     ??? Statins-Hmg-Coa Reductase Inhibitors Other (See Comments)     Leg paralysis  Leg paralysis       Current Outpatient Medications   Medication Sig Dispense Refill   ??? acetaminophen (TYLENOL) 500 MG tablet Take 500 mg by mouth every six (6) hours as needed for pain.     ??? albuterol (PROVENTIL HFA;VENTOLIN HFA) 90 mcg/actuation inhaler Inhale 2 puffs. As needed     ??? ammonium lactate (AMLACTIN) 12 % cream Apply topically once daily. 400 g 6   ??? ammonium lactate (LAC-HYDRIN) 12 % lotion As needed     ??? belimumab (BENLYSTA) 400 mg SolR Infuse 720 mg into a venous catheter every thirty (30) days.     ??? cholecalciferol, vitamin D3, (VITAMIN D3) 1,000 unit capsule Take 1,000 Units by mouth daily.     ??? clonazePAM (KLONOPIN) 1 MG tablet Take one tablet for sleep as needed at bed time 30 tablet 1   ??? clotrimazole (LOTRIMIN) 1 % cream Apply topically Two (2) times a day. 30 g 0   ??? cyanocobalamin 1000 MCG tablet Take 1,000 mcg by mouth daily.     ??? diclofenac sodium (VOLTAREN) 1 % gel Apply  2 g topically Four (4) times a day. 100 g 0   ??? DULoxetine (CYMBALTA) 60 MG capsule Take 1 capsule (60 mg total) by mouth daily. 90 capsule 1   ??? empty container Misc Use as directed 1 each PRN   ??? EPINEPHrine (EPIPEN) 0.3 mg/0.3 mL injection Inject 0.3 mL (0.3 mg total) into the muscle Take as directed. Note:carry at all times 1 each 3   ??? estradiol (ESTRACE) 0.01 % (0.1 mg/gram) vaginal cream Insert into the vagina.     ??? fluticasone propionate (FLONASE) 50 mcg/actuation nasal spray 2 sprays by Each Nare route Two (2) times a day. 16 mL prn   ??? gabapentin (NEURONTIN) 100 MG capsule Take 100 mg (1 cap) each morning and 300 mg (3 caps) each evening 360 capsule 3   ??? gabapentin (NEURONTIN) 300 MG capsule      ??? galcanezumab-gnlm (EMGALITY) 120 mg/mL injection Inject 1 ml (120 mg) under the skin every thirty (30) days. 1 mL 11   ??? hydroCHLOROthiazide (HYDRODIURIL) 25 MG tablet Take 12.5 mg by mouth daily. TAKE 0.5 TABLETS (12.5 MG TOTAL) BY MOUTH DAILY.  3   ??? levocetirizine (XYZAL) 5 MG tablet 1 daily prn allergy 30 tablet 2   ??? levothyroxine (SYNTHROID, LEVOTHROID) 50 MCG tablet Take 1 tablet (50 mcg total) by mouth daily. 90 tablet 1   ??? lidocaine (LIDODERM) 5 % patch Place 1 patch on the skin daily as needed (12 hours on/12 hours off). Apply to affected area for 12 hours only each day (then remove patch) 60 patch 5   ??? metoprolol succinate (TOPROL XL) 25 MG 24 hr tablet Take 1 tablet (25 mg total) by mouth daily. 30 tablet 11   ??? multivitamin (TAB-A-VITE/THERAGRAN) per tablet Take 1 tablet by mouth daily.     ??? naproxen (NAPROSYN) 500 MG tablet Take 1 tablet (500 mg total) by mouth 2 (two) times a day with meals. TAKE 1 TABLET (500 MG TOTAL) BY MOUTH 2 (TWO) TIMES A DAY WITH MEALS. 60 tablet 11   ??? omega 3-dha-epa-fish oil 900 mg-360 mg- 455 mg-1,000 mg cap      ??? omeprazole (PRILOSEC) 40 MG capsule Take 1 capsule (40 mg total) by mouth daily. 90 capsule 3   ??? pyridoxine, vitamin B6, (B-6) 100 MG tablet Take 100 mg by mouth daily.     ??? SPONIX ARTHRITIS-MUSCLE PAIN 4-10-0.035 % srlo      ??? tiZANidine (ZANAFLEX) 2 MG tablet Take 0.5 tablets (1 mg total) by mouth nightly as needed (back pain). for up to 30 doses Tapering off. Plan to discontinue after 6-8 weeks. 30 tablet 1   ??? traMADol (ULTRAM) 50 mg tablet Take 0.5 tablets (25 mg total) by mouth daily as needed for pain or pain,severe (7-10). 15 tablet 3   ??? traZODone (DESYREL) 50 MG tablet Take 1 tablet (50 mg total) by mouth nightly. 90 tablet 1     No current facility-administered medications for this visit.             Objective:        GENERAL PHYSICAL EXAM:    Vital Signs: Reviewed today.   BP 145/76  - Pulse 82  - Resp 16  - Ht 157.5 cm (5' 2)  - Wt 72.6 kg (160 lb 0.9 oz)  - SpO2 96%  - BMI 29.27 kg/m??   Estimated body mass index is 29.27 kg/m?? as calculated from the following:    Height as of this encounter:  157.5 cm (5' 2). Weight as of this encounter: 72.6 kg (160 lb 0.9 oz).  Facility age limit for growth percentiles is 20 years.  BP meds taken in the evening.     Wt Readings from Last 8 Encounters:   03/27/18 72.6 kg (160 lb 0.9 oz)   03/04/18 72.6 kg (160 lb)   02/25/18 73 kg (161 lb)   02/23/18 72.6 kg (160 lb 0.9 oz)   01/28/18 72.6 kg (160 lb)   01/23/18 72.3 kg (159 lb 6.4 oz)   01/21/18 71.6 kg (157 lb 14.4 oz)   12/31/17 71.7 kg (158 lb)       General Appearance: The patient was well-groomed and neat.   Head:  Atraumatic, normocephalic.   Right ear examined- clean dressing in situ.     NEUROLOGICAL EXAMINATION   Mental Status  The patient is alert and oriented to time, place and person.   Attention adequate throughout the examination.   Speech and language were normal at the bedside.    Cranial nerves  Intact. Mild ptosis bilaterally- unchanged.       Gait: Normal gait and stance.        Diagnostic Studies and Review of Records:     DATA   I have independently reviewed the studies as noted.   1. Medical Records: reviewed-including recent [facility] Psychiatry:   prior neurology visit- EMG with Dr. Vickki Muff in 2011. CONCLUSION- Normal study. There is no evidence for a mononeuropathy or cervical radiculopathy.    2. Radiology: MRI/Mra Head Wo Contrast 05/07/2017--Normal MRI/MRA of the brain.  Reports reviewed.     3.Labs: reviewed ,  Lab Results   Component Value Date    WBC 6.3 12/31/2017    WBC 6.3 12/31/2017    HGB 14.7 12/31/2017    HGB 14.8 12/31/2017    HCT 44.4 12/31/2017    HCT 44.9 12/31/2017    PLT 305 12/31/2017    PLT 314 12/31/2017       Lab Results   Component Value Date    NA 137 12/31/2017    K 5.0 12/31/2017    CL 99 12/31/2017    CO2 28.0 12/31/2017    BUN 18 12/31/2017    BUN 18 12/31/2017    CREATININE 0.72 12/31/2017    CREATININE 0.72 12/31/2017    GLU 83 12/31/2017    CALCIUM 10.6 (H) 12/31/2017       Lab Results   Component Value Date    BILITOT 0.8 12/31/2017    PROT 8.1 12/31/2017    ALBUMIN 4.9 12/31/2017 ALT 38 12/31/2017    AST 37 12/31/2017    ALKPHOS 74 12/31/2017    GGT 42 12/31/2011     Dr. Hollie Beach    CC: Olivia Lenz, MD    Dictated using voice-activated software. Typographical errors may exist.

## 2018-03-27 NOTE — Unmapped (Signed)
You received your loading dose of Emgality today.     You should make a note of the date and try to administer the medication once a month on or about the same date every month. Alternate sides for injection sites (for example, even months, inject on the right and odd months, inject on the left).     Keep track of your headaches, using a chart (see below) or an app like Migraine Buddy. Bring the calendar with you to every visit.         Warmest Regards,    Dr. Hollie Beach  Board Certified: Adult Neurology  [place] Division of General Medicine and Clinical Epidemiology  [place], [place]      It has been my pleasure to participate in your care.   You may reach me via MyChart, phone or fax.     Please note that while I will try to answer messages in a timely manner, all urgent issues should be directed to your PCP.       Helpful Contact Information :  ?? Appointments/ Internal Medicine Clinic: [phone]    ?? HIPPA Compliant Facsimile: [phone]  ?? Same Day Clinic:  [phone].  8am-5pm Monday through Friday   ?? After Hours:  [phone].  A nurse will answer and can help you decide what kind of medication attention you need.   ?? [facility]:  [phone].  If you are sick, but not injured:  9am-8pm Mon-Sun.  [address], [address], [place], [place] off of [address].  All walk-ins accepted.    ?? [facility] at the [facility]:  [phone].  7am-9pm Mon-Fri; 12pm-5pm [address]  [address] [place] [address].  All walk-ins accepted.    ??    Thank you for choosing [employer].

## 2018-03-31 ENCOUNTER — Ambulatory Visit: Admit: 2018-03-31 | Discharge: 2018-04-01 | Payer: MEDICARE

## 2018-03-31 DIAGNOSIS — H61001 Unspecified perichondritis of right external ear: Principal | ICD-10-CM

## 2018-03-31 MED ORDER — BACITRACIN 500 UNIT-POLYMYXIN B 10,000 UNIT/GRAM TOPICAL OINTMENT
2 refills | 0 days | Status: CP
Start: 2018-03-31 — End: 2018-09-17

## 2018-03-31 MED ORDER — TRIAMCINOLONE ACETONIDE 0.1 % TOPICAL OINTMENT
2 refills | 0 days | Status: CP
Start: 2018-03-31 — End: ?

## 2018-03-31 NOTE — Unmapped (Signed)
ASSESSMENT AND PLAN:    Chondrodermatitis nodularis helicis with slow healing:  -I reiterated that it is very important for her to not have pressure on that area in order for it to heal.  I recommended continuing to use the donut pillow as much as possible.  We discussed her biopsy result was very reassuring that this is not skin cancer.  -Given her level of tenderness, we will try a topical steroid that she will use once daily and alternate with a topical antibiotic.  - triamcinolone (KENALOG) 0.1 % ointment; Apply topically in the evening to ear.  Dispense: 15 g; Refill: 2  - bacitracin-polymyxin b (POLYSPORIN) 500-10,000 unit/gram ointment; Apply topically in the morning to ear.  Dispense: 14 g; Refill: 2  - Aerobic Culture.  Anaerobic culture was taken to rule out the possibility of superficial infection leading to slow healing (although I think the majority of the slow healing is due to continued pressure).  -We discussed that if not improved at follow-up at next visit in terms of tenderness a steroid injection to cooldown inflammation would be possible.    Return to clinic: 3 weeks.    CHIEF COMPLAINT:  Follow-up chondrodermatitis    HPI:   This is a pleasant 68 y.o.-year-old who last saw me on 02/04/2018.  At that time we did a biopsy of her right ear.  She had a nodule on that spot at the time which returned as chondrodermatitis.  I asked her to use a doughnut pillow and avoid pressure to that area.    She says since then she has continued to have significant ear pain.  She has tried using the donut pillow but is very difficult for her to do as she tends to still move to her ear during the night.  She is also not able to sleep in a recliner because of chronic pain.  Her ear is tender to touch.  She has been using the triple ointment to it.    PAST MEDICAL HISTORY:  Actinic keratosis  ??  SLE characterized by +ANA (negative ENA, dsDNA, normal C3/C4), fatigue, arthralgias, oral ulcers, sicca, fibromyalgia, followed by Rheum, on Benlysta infusions. Previously on Plaquenil, d/ced due to occular toxicity. ??  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    ALLERGIES:   Amoxicillin; Ketorolac; Sulfa (sulfonamide antibiotics); Zocor [simvastatin]; and Statins-hmg-coa reductase inhibitors    REVIEW OF SYSTEMS:  Baseline state of health. No recent illnesses. No other skin complaints.    PHYSICAL EXAMINATION:  Examination in the presence of female chaperone:  General: Well-developed, well-nourished. No acute distress. Neuro: Alert and oriented, answers questions appropriately.  Skin: Examination of the scalp, face, neck was performed and notable for the following:  Ulcerated antihelix on right ear, ulcer smaller and less red than at last visit, central crust, no significant purulence, tender to touch

## 2018-04-01 ENCOUNTER — Institutional Professional Consult (permissible substitution): Admit: 2018-04-01 | Discharge: 2018-04-01 | Payer: MEDICARE

## 2018-04-01 ENCOUNTER — Ambulatory Visit: Admit: 2018-04-01 | Discharge: 2018-04-01 | Payer: MEDICARE

## 2018-04-01 DIAGNOSIS — J3089 Other allergic rhinitis: Principal | ICD-10-CM

## 2018-04-01 DIAGNOSIS — M3219 Other organ or system involvement in systemic lupus erythematosus: Principal | ICD-10-CM

## 2018-04-01 LAB — PLATELET COUNT: Lab: 278

## 2018-04-01 LAB — CBC W/ AUTO DIFF
BASOPHILS ABSOLUTE COUNT: 0.1 10*9/L (ref 0.0–0.1)
BASOPHILS RELATIVE PERCENT: 1 %
EOSINOPHILS ABSOLUTE COUNT: 0.1 10*9/L (ref 0.0–0.4)
EOSINOPHILS RELATIVE PERCENT: 1.9 %
HEMATOCRIT: 46.7 % — ABNORMAL HIGH (ref 36.0–46.0)
HEMOGLOBIN: 15.1 g/dL (ref 12.0–16.0)
LARGE UNSTAINED CELLS: 2 % (ref 0–4)
LYMPHOCYTES ABSOLUTE COUNT: 1.8 10*9/L (ref 1.5–5.0)
LYMPHOCYTES RELATIVE PERCENT: 35.7 %
MEAN CORPUSCULAR HEMOGLOBIN CONC: 32.5 g/dL (ref 31.0–37.0)
MEAN CORPUSCULAR HEMOGLOBIN: 31.3 pg (ref 26.0–34.0)
MEAN PLATELET VOLUME: 7.5 fL (ref 7.0–10.0)
MONOCYTES RELATIVE PERCENT: 6.3 %
NEUTROPHILS ABSOLUTE COUNT: 2.7 10*9/L (ref 2.0–7.5)
NEUTROPHILS RELATIVE PERCENT: 53 %
PLATELET COUNT: 278 10*9/L (ref 150–440)
RED BLOOD CELL COUNT: 4.84 10*12/L (ref 4.00–5.20)
RED CELL DISTRIBUTION WIDTH: 13.6 % (ref 12.0–15.0)
WBC ADJUSTED: 5.1 10*9/L (ref 4.5–11.0)

## 2018-04-01 LAB — C4 COMPLEMENT
C4 COMPLEMENT: 29.6 mg/dL (ref 15.0–48.0)
Complement C4:MCnc:Pt:Ser/Plas:Qn:: 29.6

## 2018-04-01 LAB — BLOOD UREA NITROGEN: Urea nitrogen:MCnc:Pt:Ser/Plas:Qn:: 22 — ABNORMAL HIGH

## 2018-04-01 LAB — C3 COMPLEMENT: Complement C3:MCnc:Pt:Ser/Plas:Qn:: 140

## 2018-04-01 LAB — CREATININE
Creatinine:MCnc:Pt:Ser/Plas:Qn:: 0.81
EGFR CKD-EPI NON-AA FEMALE: 76 mL/min/{1.73_m2} (ref >=60)

## 2018-04-01 NOTE — Unmapped (Signed)
TEFFANY, BLASZCZYK [id number] ALLERGY SERUM WAS ORDERED BY DR. Dorma Russell KIM ON 02/25/2018 AND FAXED TO [facility]. THE ALLERGY SERUM WAS RECEIVED ON 03/24/2018. PATIENT WILL RECEIVE INJECTIONS IN OUR CLINIC. SERUM IN THE CLINIC [facility]ID[facility]. DR. Dorma Russell KIM DETERMINED THE CORRECT DOSES FOR THE ANTIGENS AND PROVIDED THE SUPERVISION FOR THE PREPARATION OF MULTIPLE DOSE VIALS OF ALLERGEN IMMUNOTHERAPY.THIS SERUM WAS PREPARED USING RESULTS FROM ALLERGY TESTING PERFORMED ON THIS PATIENT.     CHARGES FOR IMMUNOTHERAPY (CODE: 84696): 2 VIALS FOR 20 DOSES.  DIAGNOSIS: J30.89

## 2018-04-01 NOTE — Unmapped (Signed)
1135??Patient in today for Benlysta infusion. Patient has no s/s of infection. No issues from the last infusion.   1150??Benlysta started ,patient instructed to use call bell /call nurse for any s/s of unsual symptoms during infusion. Patient educated on possible s/s of reaction,such as chestpain ,itching,shortness of breath lightheadedness and any kind of discomfort. Patient verbalized understanding...  1414??Infusion completed and well tolerated.  1430??Benlysta 708??mg/250??ml??NS infused over 1hr and??24??min.??per protocol . Pt alert and oriented, offered no complaints during infusion. IV flushed with 10ml

## 2018-04-02 LAB — DSDNA ANTIBODY: Lab: NEGATIVE

## 2018-04-07 NOTE — Unmapped (Signed)
My Chart message sent

## 2018-04-08 ENCOUNTER — Ambulatory Visit: Admit: 2018-04-08 | Discharge: 2018-04-09 | Payer: MEDICARE

## 2018-04-08 DIAGNOSIS — G8929 Other chronic pain: Secondary | ICD-10-CM

## 2018-04-08 DIAGNOSIS — E039 Hypothyroidism, unspecified: Secondary | ICD-10-CM

## 2018-04-08 DIAGNOSIS — R5383 Other fatigue: Principal | ICD-10-CM

## 2018-04-08 MED ORDER — LEVOTHYROXINE 50 MCG TABLET
ORAL_TABLET | Freq: Every day | ORAL | 3 refills | 0.00000 days | Status: CP
Start: 2018-04-08 — End: ?

## 2018-04-08 MED ORDER — TRAMADOL 50 MG TABLET
ORAL_TABLET | Freq: Every day | ORAL | 0 refills | 0 days | Status: CP | PRN
Start: 2018-04-08 — End: 2018-06-18

## 2018-04-08 MED ORDER — HYDROCHLOROTHIAZIDE 25 MG TABLET
Freq: Every day | ORAL | 3 refills | 0 days | Status: CP
Start: 2018-04-08 — End: 2018-09-23

## 2018-04-08 NOTE — Unmapped (Addendum)
Have labs drawn at time of infusion    Mix 4% lidocaine cream with aspercream, half and half for pain relief

## 2018-04-08 NOTE — Unmapped (Signed)
Assessment and Plan    #1. HTN, uncontrolled  - start metoprolol ER 25 mg once daily, can start with half a pill then increase to one pill  - Stop HCTZ  ??  #2. Hypothyroid  - stable, continue current medications  ??  #3. Neuropathic pain  - voltaren gel as needed  - previously took tizanidine at night which was helpful, started 2mg  qhs as needed  -consider medication change from neurontin to keppra  ??  #4 Migraine  -Start emgality within the next week  ??  #5. Long term benzo use  -begin taper next visit  ??  #6. Anxiety  -encouraged to try celexa    1. Fatigue, unspecified type    2. Hypothyroidism, unspecified type    3. Other chronic pain        #1. HTN, well controlled today  -Could not tolerate metoprolol, so only taking HCTZ 12.5 mg once daily     #2. Opthomology  -has FU next week with Dr. Armando Reichert  -Can't read or watch or TV hurts eyes, left is worse, having dry eyes and pain    #3. Migraine   -HA's down about 50% on emgality  -Have ordered CRP and ESR since still having HA's and visual issues to r/o temporal arteritis, she's going to have drawn at next infusion.  Last drawn about 10 months ago    #4. Chronic pain  -Mix OTC 4 % lidocaine cream with aspercream at half and half     #5 Ear wax in right ear  -dobrex    Orders Placed This Encounter   Procedures   ??? Sedimentation Rate   ??? C-reactive protein       Follow up: Return in about 2 months (around 06/07/2018).    Reason for visit: Follow-up      Olivia Stout is a 68 y.o. female with mirgraine who presents for follow up.    HPI:  She is having a lot of eye pain and dryness, and this is making so she can't drive or go out in the sun during the day.  She has an appt with Dr. Armando Reichert.  She also reports feeling dizzy recently.    As for HA's she's on 3rd dose of emgality and having about a 50% reduction in frequency.  Today she has a HA and light sensitivity.    She has recently seen dermatology for ear issue and they are recommending keeping the pressure off her ear at night, so sleeping on back or other side.  She's trying to do this.    ROS:  Having jaw pain, had a root canal last year, popping    Medications were reviewed    Physical Exam  Gen:  Sensitive to the light, and squinting, had to turn down the light   Vitals:    04/08/18 1333   BP: 126/80   Pulse: (P) 71   SpO2: (P) 94%   Weight: (P) 71.2 kg (157 lb)   Height: (P) 157.5 cm (5' 2)     TM's retracted on left, ear wax on right  CV: RRR, no mrg  Pulm: CTAB  Ext: no edema      I have reviewed and addressed the patient???s adherence and response to prescribed medications. I have identified patient barriers to following the proposed medication and treatment plan, and have noted opportunities to optimize healthy behaviors. I have answered the patient???s questions to satisfaction and the patient voices understanding.

## 2018-04-14 NOTE — Unmapped (Signed)
[facility] Refill and Clinical Coordination Note  Medication(s): Emgality 120mg /ml    Randolm Idol, DOB: Oct 27, 1950  Phone: [phone] (home) [phone] (work), Alternate phone contact: N/A  Shipping address: [address]  [place] [address]  Phone or address changes today?: No  All above HIPAA information verified.  Insurance changes? No    Completed refill and clinical call assessment today to schedule patient's medication shipment from the [facility] [phone]).      MEDICATION RECONCILIATION    Confirmed the medication and dosage are correct and have not changed: Yes, regimen is correct and unchanged.    Were there any changes to your medication(s) in the past month:  No, there are no changes reported at this time.    ADHERENCE    Is this medicine transplant or covered by Medicare Part B? No.    Did you miss any doses in the past 4 weeks? No missed doses reported.  Adherence counseling provided? Not needed     SIDE EFFECT MANAGEMENT    Are you tolerating your medication?:  Petra reports tolerating the medication.  Side effect management discussed: None      Therapy is appropriate and should be continued.    Evidence of clinical benefit: See Epic note from 03/27/2018      FINANCIAL/SHIPPING    Delivery Scheduled: Yes, Expected medication delivery date: 04/22/2018     Medication will be delivered via [facility] to the temporary address in [place].    Additional medications refilled: No additional medications/refills needed at this time.    The patient will receive a drug information handout for each medication shipped and additional FDA Medication Guides as required.      Pedro did not have any additional questions at this time.    Delivery address confirmed in Epic.     We will follow up with patient monthly for standard refill processing and delivery.      Thank you,  Lupita Shutter   [facility] Specialty Pharmacist

## 2018-04-15 ENCOUNTER — Ambulatory Visit: Admit: 2018-04-15 | Discharge: 2018-04-16 | Payer: MEDICARE

## 2018-04-15 DIAGNOSIS — H02889 Meibomian gland dysfunction of unspecified eye, unspecified eyelid: Secondary | ICD-10-CM

## 2018-04-15 DIAGNOSIS — H04129 Dry eye syndrome of unspecified lacrimal gland: Principal | ICD-10-CM

## 2018-04-15 DIAGNOSIS — H2513 Age-related nuclear cataract, bilateral: Secondary | ICD-10-CM

## 2018-04-15 NOTE — Unmapped (Signed)
Instructions:     Try a different brand of artificial tears - use 3-6x/day if it feels better to use these.      FreshKote   Bion Tears   Systane preservative free     Use warm compresses once daily (you can use a clean sock with dry rice, warmed in the microwave until warm, or buy a mask such as the [employer]).  Place on both eyes for about 5 minutes. Afterwards look up and very gently push on the lower eyelid near the lash line to express your natural oils into your tear film.    Take omega 3 by mouth (2 grams daily, good sources are fish oil [employer] is a good brand), flax seed oil, krill oil, and walnuts (about 1/4 cup of walnuts has roughly 2 grams of omega 3).  * Make sure this is okay with your primary care doctor as high doses of omega 3 does thin the blood.  Also, be sure that your fish oil is purified from mercury.

## 2018-04-15 NOTE — Unmapped (Signed)
Work up: V/DEMS/T    68 y.o. female with past medical history of migraine, htn, mitral valve prolapse, Sjogren's syndrome, hypothyroidism, anxiety/depression, chronic pain, fibromyalgia, hyperlipidemia, and h/o B12 deficiency (normal level 01/2018).     Dr. Earlene Plater f/u pt, new pt to me.    #. Dry eye    DEMS 04/15/18 is 10.  Main symptoms are blurry vision, eye fatigue, burning, redness/itching.    Prior tx:   Doxycycline - caused a rash in her mouth  Restasis - burned her eyes   Xiidra - couldn't use     Current tx:  - AT's (Theratears and Refresh plus - she was using these at least 3x/day and they made her vision worse and made it difficult for her to see)  - Systane gel (she doesn't use this)  - WC's (uses a hot wash cloth)  - S/p bilateral cautery of inferior punctae   - plug LUL   - fish oil (she takes 1,000 mg daily)  - lid scrubs (not doing this)     Plan 04/15/18:    Placed Vera 90 0.4x2 mm plugs today RUL/LUL  Patient Instructions   Instructions:     Try a different brand of artificial tears - use 3-6x/day if it feels better to use these.      FreshKote   Bion Tears   Systane preservative free     Use warm compresses once daily (you can use a clean sock with dry rice, warmed in the microwave until warm, or buy a mask such as the [employer]).  Place on both eyes for about 5 minutes. Afterwards look up and very gently push on the lower eyelid near the lash line to express your natural oils into your tear film.    Take omega 3 by mouth (2 grams daily, good sources are fish oil [employer] is a good brand), flax seed oil, krill oil, and walnuts (about 1/4 cup of walnuts has roughly 2 grams of omega 3).  * Make sure this is okay with your primary care doctor as high doses of omega 3 does thin the blood.  Also, be sure that your fish oil is purified from mercury.          #. Nuclear sclerotic cataracts, mild     #. H/o plaquenil use, was stopped 06/2016 due to concerns for early toxicity  - followed by Dr. Waunita Schooner, f/u 12/2018    #. Choroidal nevus OD  - followed by Dr. Waunita Schooner, f/u 12/2018    #. Hyperopia    Return for F/u 4-6 wks, Ok to [employer].    I examined this patient and agree with the assessment and plan.  Bradd Canary Armando Reichert, MD

## 2018-04-22 ENCOUNTER — Ambulatory Visit: Admit: 2018-04-22 | Discharge: 2018-04-23 | Payer: MEDICARE

## 2018-04-22 DIAGNOSIS — H61001 Unspecified perichondritis of right external ear: Principal | ICD-10-CM

## 2018-04-22 MED FILL — EMGALITY PEN 120 MG/ML SUBCUTANEOUS PEN INJECTOR: 30 days supply | Qty: 1 | Fill #2 | Status: AC

## 2018-04-22 MED FILL — EMGALITY PEN 120 MG/ML SUBCUTANEOUS PEN INJECTOR: SUBCUTANEOUS | 30 days supply | Qty: 1 | Fill #2

## 2018-04-22 NOTE — Unmapped (Signed)
ASSESSMENT AND PLAN:    Chondrodermatitis nodularis helicis, significantly improved since last visit:  -I discussed with her injections versus continued use of the triamcinolone.  We discussed that there was no evidence of infection in the ear does not appear infected today.  After discussion, she would like to give the triamcinolone a few more weeks to see if it helps and would like to come back for recheck to consider injections again at that time.    Return to clinic: 3 weeks for chondrodermatitis recheck.    CHIEF COMPLAINT:  Follow-up chondrodermatitis    HPI:   This is a pleasant 68 y.o.-year-old who last saw me 3 weeks ago.  She has been struggling with chondrodermatitis which was previously biopsied on 02/04/2018.  At last visit it was healing up reasonably and so we deferred treatment.  We did a culture at that time which was negative.  She was also prescribed triamcinolone ointment and Polysporin, which she has been using.    Since last visit, her symptoms have significantly improved although have not completely resolved.    PAST MEDICAL HISTORY:  Actinic keratosis  ??  SLE characterized by +ANA (negative ENA, dsDNA, normal C3/C4), fatigue, arthralgias, oral ulcers, sicca, fibromyalgia, followed by Rheum, on Benlysta infusions. Previously on Plaquenil, d/ced due to occular toxicity. ??  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     ALLERGIES:   Amoxicillin; Doxycycline; Ketorolac; Sulfa (sulfonamide antibiotics); Zocor [simvastatin]; and Statins-hmg-coa reductase inhibitors    REVIEW OF SYSTEMS:  Baseline state of health. No recent illnesses. No other skin complaints.    PHYSICAL EXAMINATION:  Examination in the presence of female chaperone:  General: Well-developed, well-nourished. No acute distress. Neuro: Alert and oriented, answers questions appropriately.  Skin: Examination of the face and ears was performed and notable for the following:  Right ear with irritation and redness and focal ulceration of the antihelix, significantly improved since last visit

## 2018-04-22 NOTE — Unmapped (Signed)
Basic Skin Care  Your skin plays an important role in keeping the entire body healthy.  Below are some tips on how to try and maximize skin health from the outside in.    1) Bathe in mildly warm water every 1 to 2 days, followed by light drying and an application of a thick moisturizer cream or ointment, preferably one that comes in a tub.  a. Fragrance free moisturizing bars or body washes are preferred such as Purpose, Cetaphil, Dove sensitive skin, Aveeno, or Vanicream products.  b. Use a fragrance free cream or ointment, not a lotion, such as plain petroleum jelly or Vaseline ointment, Aquaphor, Vanicream, Eucerin cream or a generic version, CeraVe Cream, Cetaphil Restoraderm, Aveeno Eczema Therapy and [employer], among others.  c. People with very dry skin often need to put on these creams two, three or four times a day.  As much as possible, use these creams enough to keep the skin from looking dry.  d. Consider using fragrance free/dye free detergent, such as Arm [employer] Hammer for sensitive skin, Tide Free or All Free.     2) If I am prescribing a medication to go on the skin, the medicine goes on first to the areas that need it, followed by a thick cream as above to the entire body.    3) Wynelle Link is a major cause of damage to the skin.  a. I recommend sun protection for all of my patients. I prefer physical barriers such as hats with wide brims that cover the ears, long sleeve clothing with SPF protection including rash guards for swimming. These can be found seasonally at outdoor clothing companies, [employer] and [employer] and online at Liz [email or web address], www.uvskinz.com and BrideEmporium.nl. Avoid peak sun between the hours of 10am to 3pm to minimize sun exposure.    b. I recommend sunscreen for all of my patients older than 26 months of age when in the sun, preferably with broad spectrum coverage and SPF 30 or higher.   i. For sensitive skin: I recommend sunscreens that only contain titanium dioxide and/or zinc oxide in the active ingredients. These do not burn the eyes and appear to be safer than chemical sunscreens. Products includ Vanicream Broad Spectrum 50+, Aveeno Natural Mineral Protection, Neutrogena Pure and Free Baby, [employer] Daily face and body lotion, and EltaMD.  ii. There is no such thing as waterproof sunscreen. All sunscreens should be reapplied after 60-80 minutes of wear.   Iii. For those that prefer not to use cream-based sunscreen, there are alternatives such as SPRAYS and STICKS.  These are also good options if you are sweaty or working out.  Spray on sunscreens often use chemical sunscreens which do protect against the sun. However, these can be difficult to apply correctly, especially if wind is present, and can be more likely to irritate the skin.  I recommend applying it at least twice over the involved areas.  For the stick, I recommend Neutrogena.  It looks like a deodorant stick.  It must be applied 4 times over the same area to protect as much as the cream.      c. I also recommend discussing Vitamin D supplementation with your primary care doctor as patients typically do not get enough Vitamin D through the skin in our area, even without using sunscreen.

## 2018-04-24 ENCOUNTER — Ambulatory Visit: Admit: 2018-04-24 | Discharge: 2018-04-25 | Payer: MEDICARE | Attending: Neurology | Primary: Neurology

## 2018-04-24 DIAGNOSIS — G43709 Chronic migraine without aura, not intractable, without status migrainosus: Principal | ICD-10-CM

## 2018-04-24 DIAGNOSIS — G43119 Migraine with aura, intractable, without status migrainosus: Secondary | ICD-10-CM

## 2018-04-24 NOTE — Unmapped (Signed)
[facility] NEUROLOGY REPORT    Date: April 24, 2018  Patient Name: Olivia Stout  MRN: [id number]  PCP:  Lolita Lenz  Time in with patient: 2:50 PM  Time out with patient: 3:07 PM    Prior visit with this neurologist: March 27, 2018; February 23, 2018; January 26, 2018; 12/17/2017; 08/22/2017; 06/24/2017; 05/30/2017; 05/27/2017; 04/29/2017  Assessment and Plan/ Clinical Decision Making          1. Chronic migraine    2. Intractable migraine with aura without status migrainosus  Olivia Stout is a 68 y.o. with chronic migraine- now on month #3 of Galcanezumab- unable to self-administer due to anxiety and needle phobia.   Tried Botox- no improvement subjectively.   Has initiated care with [facility] Psychiatry.  Headaches are much improved- calendar reviewed (see scanned- sent via Migraine Buddy App)    PLAN:   ?? Continue to keep HA log- sMigraine Buddy.   ?? Addressed MOH. At this time would like her to consider avoiding Naprosyn and other pain medications.   ?? She has not tolerated rizatriptan or sumatriptan due to side effects. No further triptans offered.  ?? Consider eTNS or VNS for migraine- not covered by insurance- out of pocket cost is high- given her information about the site.   ?? Discussed Lasmiditan- dizziness is not a desirable side effect.     PROCEDURE NOTE:   Left anterior thigh cleaned. Local icepack. Identified medication. Injected to left anterior thigh. No complications.   - galcanezumab-gnlm (EMGALITY) injection 120 mg  No side effects. Last month had a bruise- see MAR    HEALTH EDUCATION/HEALTH LITERACY: PATIENT EDUCATION was provided. Reviewed the differential diagnosis, plan of care in detail, as well as other elements as detailed above. The benefits and risks of each of the above procedures, benefits and alternative options were reviewed with the patient.   Medication Management: There is a high risk for medication side effects, which will require ongoing monitoring and dose adjustment. Medication side effect profile was reviewed in detail with patient. Medication safety and drug interactions reviewed on ERx.   Patient Counselling: All the patient's questions and concerns were addressed to their stated satisfaction .    Barriers to Care: Multiple factors including depression, comorbid medical conditions, ongoing biological therapy, cost of medications and cost of care, access to care.Very anxious.   Total face-to-face time included: 17 minutes > 50 % in direct consultation reviewing details of history, examination and data findings, discussing my clinical reasoning and clinical impression, as well as a review of my recommendations for a plan of treatment as documented above. Additional time was provided to address alternative options for care, health literacy regarding the differential diagnosis, testing and medication options, the benefits of current plan as well as an opportunity to address all the patient's questions and concerns to their reported satisfaction. A written summary was provided to the patient at the time of the visit, as well as instructions on how to access My Chart. My contact information was provided along with instructions on how to reach the administrative office and the clinic.     Return in about 31 days (around 05/25/2018) for Return for re-evaluation..         Subjective:          SOURCE: The history is obtained from the patient who appears to be a vague historian. She has been communicating with me via MyChart. She has a hard time answering questions even with direct  questions. Tangential in answering questions. Additional history is obtained from review of available medical records. Open-ended and close-ended questions, as well as questionnaire data and review of EPIC chart was used to obtain details of the history.    History of Present Illness:     Olivia Stout is a 68 y.o. right handed female seen in followup for evaluation of Follow-up (Chronic Migraine)      CURRENT VISIT: 04/24/18  Here for Emgality injection #4- too phobic and anxious to self administer. No one else can provide drug.   Doing well with reduced frequency, intensity of HA- also has HA -free days for the first time.   No new problems- had some bruising Left Ant Thigh- resolved.   Has seen Derm and Eye- stable. Monthly infusions from Rheumatology.     PRIOR VISITS: March 27, 2018   Here for [employer] #3.   Headache Calendar reviewed on Migraine Buddy- unable to send it- Having a headache approx QOD- a huge improvement from daily. Feels that the medication does best immediately after and worse closer to next injection.   No new issues. Doing well overall.   NO site reactions.   Has Eye appt pending.       02/23/2018  Here for Emgality #2. Feels a little better- ear lesion is healing slowly. Had biopsy.   No new problems.   Headache Calendar reviewed; [email or web address]     She has a strong family history of migraines she has 1 daughter with headaches a sister with headaches and her father had severe headaches.  She has no history of heart disease.  Other studies/data: normal brain MRI/MRA for same indication in 2006 at [facility]- results reviewed.     Sleep Study: no  Imaging: yes    TRAVEL HISTORY: [place]  CHILDHOOD HISTORY: Headaches in her teens.    Past Medical History:   Diagnosis Date   ??? Allergy desensitization therapy    ??? Anxiety    ??? Arthritis    ??? Back pain, chronic 2003    pinched nerve   ??? Depression    ??? Disease of thyroid gland    ??? Dry eyes     Had permanent cautery lower canula, tried plugs  and partial but caused waatering.   ??? Dysphagia    ??? Dysplastic nevi    ??? Eye trauma     wood chip with corneal abrasion right eye.  metal right eye removed also with abrasion   ??? Fibromyalgia    ??? Fibromyalgia, primary    ??? GERD (gastroesophageal reflux disease)    ??? High cholesterol    ??? History of gastric ulcer    ??? History of underactive thyroid    ??? Hypertension     watching   ??? IBS (irritable bowel syndrome)    ??? Long term prescription benzodiazepine use 06/29/2017   ??? Lupus (CMS-HCC)    ??? Lymphoma (CMS-HCC)     in remission; s/p chemotherapy - NON HODGKINS   ??? Major depressive disorder    ??? Mixed stress and urge urinary incontinence 02/08/2013   ??? Sjoegren syndrome    ??? Urinary, incontinence, stress female    ??? Vaginal prolapse          Past Surgical History:   Procedure Laterality Date   ??? BACK SURGERY  07/27/2015   ??? ESOPHAGEAL DILATION     ??? FRACTURE SURGERY      left forearm   ??? HYSTERECTOMY     ???  KNEE SURGERY     ??? LAPAROSCOPIC NISSEN FUNDOPLICATION     ??? PR ANAL PRESSURE RECORD Left 12/01/2012    Procedure: ANORECTAL MANOMETRY;  Surgeon: None None;  [facility];  Service: Gastroenterology   ??? PR BREATH HYDROGEN TEST N/A 12/01/2012    Procedure: BREATH HYDROGEN TEST;  Surgeon: None None;  [facility];  Service: Gastroenterology   ??? SKIN BIOPSY     ??? SPINE SURGERY     ??? TAH and bladder surgery      partial   ??? TONSILLECTOMY         Social History     Social History Narrative   ??? Not on file       Social History     Tobacco Use   Smoking Status Never Smoker   Smokeless Tobacco Never Used         reports no history of alcohol use.      Family History   Problem Relation Age of Onset   ??? Diabetes Mother    ??? Crohn's disease Mother    ??? Emphysema Mother    ??? Glaucoma Mother    ??? Asthma Mother    ??? Osteoporosis Mother    ??? Parkinsonism Mother    ??? Stroke Father    ??? Heart disease Father    ??? Cancer Father    ??? Migraines Father    ??? Hypertension Sister    ??? Migraines Sister    ??? Migraines Daughter    ??? No Known Problems Son    ??? No Known Problems Son    ??? ADD / ADHD Neg Hx    ??? Alcohol abuse Neg Hx    ??? Anxiety disorder Neg Hx    ??? Bipolar disorder Neg Hx    ??? Dementia Neg Hx    ??? Depression Neg Hx    ??? Drug abuse Neg Hx    ??? OCD Neg Hx    ??? Paranoid behavior Neg Hx    ??? Physical abuse Neg Hx    ??? Schizophrenia Neg Hx    ??? Sexual abuse Neg Hx    ??? Seizures Neg Hx    ??? Macular degeneration Neg Hx    ??? Blindness Neg Hx    ??? Melanoma Neg Hx    ??? Basal cell carcinoma Neg Hx    ??? Squamous cell carcinoma Neg Hx          Allergies   Allergen Reactions   ??? Amoxicillin Swelling     Sweating and swelling in throat      ??? Doxycycline Rash     Caused blisters in her mouth.   ??? Ketorolac Shortness Of Breath   ??? Sulfa (Sulfonamide Antibiotics) Other (See Comments)     welts in my mouth   ??? Zocor [Simvastatin] Other (See Comments)     Had issues w/ bearing weight and pain in legs. *Cannot have any statins at all.*     ??? Statins-Hmg-Coa Reductase Inhibitors Other (See Comments)     Leg paralysis  Leg paralysis       Current Outpatient Medications   Medication Sig Dispense Refill   ??? acetaminophen (TYLENOL) 500 MG tablet Take 500 mg by mouth every six (6) hours as needed for pain.     ??? albuterol (PROVENTIL HFA;VENTOLIN HFA) 90 mcg/actuation inhaler Inhale 2 puffs. As needed     ??? ammonium lactate (AMLACTIN) 12 % cream Apply topically  once daily. 400 g 6   ??? ammonium lactate (LAC-HYDRIN) 12 % lotion As needed     ??? bacitracin-polymyxin b (POLYSPORIN) 500-10,000 unit/gram ointment Apply topically in the morning to ear. 14 g 2   ??? belimumab (BENLYSTA) 400 mg SolR Infuse 720 mg into a venous catheter every thirty (30) days.     ??? cholecalciferol, vitamin D3, (VITAMIN D3) 1,000 unit capsule Take 1,000 Units by mouth daily.     ??? clonazePAM (KLONOPIN) 1 MG tablet Take one tablet for sleep as needed at bed time 30 tablet 1   ??? clotrimazole (LOTRIMIN) 1 % cream Apply topically Two (2) times a day. 30 g 0   ??? cyanocobalamin 1000 MCG tablet Take 1,000 mcg by mouth daily.     ??? diclofenac sodium (VOLTAREN) 1 % gel Apply 2 g topically Four (4) times a day. 100 g 0   ??? DULoxetine (CYMBALTA) 60 MG capsule Take 1 capsule (60 mg total) by mouth daily. 90 capsule 1   ??? empty container Misc Use as directed 1 each PRN   ??? EPINEPHrine (EPIPEN) 0.3 mg/0.3 mL injection Inject 0.3 mL (0.3 mg total) into the muscle Take as directed. Note:carry at all times 1 each 3   ??? estradiol (ESTRACE) 0.01 % (0.1 mg/gram) vaginal cream Insert into the vagina.     ??? fluticasone propionate (FLONASE) 50 mcg/actuation nasal spray 2 sprays by Each Nare route Two (2) times a day. 16 mL prn   ??? gabapentin (NEURONTIN) 100 MG capsule Take 100 mg (1 cap) each morning and 300 mg (3 caps) each evening 360 capsule 3   ??? gabapentin (NEURONTIN) 300 MG capsule      ??? galcanezumab-gnlm (EMGALITY) 120 mg/mL injection Inject 1 ml (120 mg) under the skin every thirty (30) days. 1 mL 11   ??? hydroCHLOROthiazide (HYDRODIURIL) 25 MG tablet Take 0.5 tablets (12.5 mg total) by mouth daily. TAKE 0.5 TABLETS (12.5 MG TOTAL) BY MOUTH DAILY. 45 each 3   ??? levocetirizine (XYZAL) 5 MG tablet 1 daily prn allergy 30 tablet 2   ??? levothyroxine (SYNTHROID) 50 MCG tablet Take 1 tablet (50 mcg total) by mouth daily. 90 tablet 3   ??? lidocaine (LIDODERM) 5 % patch Place 1 patch on the skin daily as needed (12 hours on/12 hours off). Apply to affected area for 12 hours only each day (then remove patch) 60 patch 5   ??? multivitamin (TAB-A-VITE/THERAGRAN) per tablet Take 1 tablet by mouth daily.     ??? naproxen (NAPROSYN) 500 MG tablet Take 1 tablet (500 mg total) by mouth 2 (two) times a day with meals. TAKE 1 TABLET (500 MG TOTAL) BY MOUTH 2 (TWO) TIMES A DAY WITH MEALS. 60 tablet 11   ??? omega 3-dha-epa-fish oil 900 mg-360 mg- 455 mg-1,000 mg cap      ??? omeprazole (PRILOSEC) 40 MG capsule Take 1 capsule (40 mg total) by mouth daily. 90 capsule 3   ??? pyridoxine, vitamin B6, (B-6) 100 MG tablet Take 100 mg by mouth daily.     ??? SPONIX ARTHRITIS-MUSCLE PAIN 4-10-0.035 % srlo      ??? tiZANidine (ZANAFLEX) 2 MG tablet Take 0.5 tablets (1 mg total) by mouth nightly as needed (back pain). for up to 30 doses Tapering off. Plan to discontinue after 6-8 weeks. 30 tablet 1   ??? traMADol (ULTRAM) 50 mg tablet Take 0.5 tablets (25 mg total) by mouth daily as needed for pain or pain,severe (7-10). 15 tablet 0   ???  traZODone (DESYREL) 50 MG tablet Take 1 tablet (50 mg total) by mouth nightly. 90 tablet 1   ??? triamcinolone (KENALOG) 0.1 % ointment Apply topically in the evening to ear. 15 g 2     No current facility-administered medications for this visit.             Objective:        GENERAL PHYSICAL EXAM:    Vital Signs: Reviewed today.   BP 145/75  - Pulse 78  - Resp 16  - Ht 157.5 cm (5' 2)  - Wt 72.9 kg (160 lb 11.2 oz)  - SpO2 95%  - BMI 29.39 kg/m??   Estimated body mass index is 29.39 kg/m?? as calculated from the following:    Height as of this encounter: 157.5 cm (5' 2).    Weight as of this encounter: 72.9 kg (160 lb 11.2 oz).  Facility age limit for growth percentiles is 20 years.  BP meds taken in the evening.     Wt Readings from Last 8 Encounters:   04/24/18 72.9 kg (160 lb 11.2 oz)   04/08/18 (P) 71.2 kg (157 lb)   04/01/18 70.8 kg (156 lb)   03/27/18 72.6 kg (160 lb 0.9 oz)   03/04/18 72.6 kg (160 lb)   02/25/18 73 kg (161 lb)   02/23/18 72.6 kg (160 lb 0.9 oz)   01/28/18 72.6 kg (160 lb)       General Appearance: The patient was well-groomed and neat.   Head:  Atraumatic, normocephalic.   Right ear examined- clean dressing in situ.     NEUROLOGICAL EXAMINATION   Mental Status  The patient is alert and oriented to time, place and person.   Attention adequate throughout the examination.   Speech and language were normal at the bedside.      Gait: Normal gait and stance.        Diagnostic Studies and Review of Records:     DATA   I have independently reviewed the studies as noted.   1. Medical Records: reviewed-including recent [facility] Psychiat[facility]:   prior neurology visit- EMG with Dr. Vickki Muff in 2011. CONCLUSION- Normal study. There is no evidence for a mononeuropathy or cervical radiculopathy.    2. Radiology: MRI/Mra Head Wo Contrast 05/07/2017--Normal MRI/MRA of the brain.  Reports reviewed.     3.Labs: reviewed ,  Lab Results   Component Value Date    WBC 5.1 04/01/2018    HGB 15.1 04/01/2018    HCT 46.7 (H) 04/01/2018    PLT 278 04/01/2018       Lab Results   Component Value Date    NA 137 12/31/2017    K 5.0 12/31/2017    CL 99 12/31/2017    CO2 28.0 12/31/2017    BUN 22 (H) 04/01/2018    CREATININE 0.81 04/01/2018    GLU 83 12/31/2017    CALCIUM 10.6 (H) 12/31/2017       Lab Results   Component Value Date    BILITOT 0.8 12/31/2017    PROT 8.1 12/31/2017    ALBUMIN 4.9 12/31/2017    ALT 38 12/31/2017    AST 37 12/31/2017    ALKPHOS 74 12/31/2017    GGT 42 12/31/2011     Dr. Hollie Beach    CC: Lolita Lenz, MD    Dictated using voice-activated software. Typographical errors may exist.

## 2018-04-24 NOTE — Unmapped (Signed)
Warmest Regards,    Dr. Hollie Beach  Board Certified: Adult Neurology  [facility] of General Medicine and Clinical Epidemiology  [place], [place]      It has been my pleasure to participate in your care.   You may reach me via MyChart, phone or fax.     Please note that while I will try to answer messages in a timely manner, all urgent issues should be directed to your PCP.       Helpful Contact Information :  ?? Appointments/ Internal Medicine Clinic: [phone]    ?? HIPPA Compliant Facsimile: [phone]  ?? Same Day Clinic:  [phone].  8am-5pm Monday through Friday   ?? After Hours:  [phone].  A nurse will answer and can help you decide what kind of medication attention you need.   ?? [facility] Ur[facility]:  [phone].  If you are sick, but not injured:  9am-8pm Mon-Sun.  [address], [place], [place] off of [address].  All walk-ins accepted.    ?? [facility] at the [facility]:  [phone].  7am-9pm Mon-Fri; 12pm-5pm [address].  [address], [place] [address].  All walk-ins accepted.    ??    Thank you for choosing [employer].

## 2018-04-27 MED ORDER — FLUTICASONE PROPIONATE 50 MCG/ACTUATION NASAL SPRAY,SUSPENSION
Freq: Two times a day (BID) | NASAL | prn refills | 0 days | Status: CP
Start: 2018-04-27 — End: 2019-04-27

## 2018-04-27 NOTE — Unmapped (Signed)
Last seen 4/16; refilled flonase; previous [facility] patient

## 2018-04-29 ENCOUNTER — Ambulatory Visit: Admit: 2018-04-29 | Discharge: 2018-04-29 | Payer: MEDICARE

## 2018-04-29 ENCOUNTER — Institutional Professional Consult (permissible substitution): Admit: 2018-04-29 | Discharge: 2018-04-29 | Payer: MEDICARE

## 2018-04-29 DIAGNOSIS — M3219 Other organ or system involvement in systemic lupus erythematosus: Principal | ICD-10-CM

## 2018-04-29 DIAGNOSIS — J3089 Other allergic rhinitis: Principal | ICD-10-CM

## 2018-04-29 DIAGNOSIS — R5383 Other fatigue: Secondary | ICD-10-CM

## 2018-04-29 LAB — C-REACTIVE PROTEIN
C reactive protein:MCnc:Pt:Ser/Plas:Qn:: <5
C-REACTIVE PROTEIN: <5 mg/L (ref <10.0)

## 2018-04-29 LAB — ERYTHROCYTE SEDIMENTATION RATE: Lab: 7

## 2018-04-29 MED ORDER — CLONAZEPAM 1 MG TABLET
ORAL_TABLET | 3 refills | 0 days | Status: CP
Start: 2018-04-29 — End: 2018-08-30

## 2018-04-30 NOTE — Unmapped (Signed)
1130 Patient in today for Benlysta infusion. Patient has no s/s of infection. No issues from the last infusion.   1230??Benlysta started ,patient instructed to use call bell /call nurse for any s/s of unsual symptoms during infusion. Patient educated on possible s/s of reaction,such as chestpain ,itching,shortness of breath lightheadedness and any kind of discomfort. Patient verbalized understanding...  1355??Infusion completed and well tolerated.  1415??Benlysta 712??mg/250??ml??NS infused over 1hr and??25??min.??per protocol . Pt alert and oriented, offered no complaints during infusion. IV flushed with 10ml NS . Patient discharged in stable condition.

## 2018-05-07 NOTE — Unmapped (Signed)
na

## 2018-05-12 NOTE — Unmapped (Deleted)
ASSESSMENT AND PLAN:  ***    Return to clinic: ***    CHIEF COMPLAINT:  ***    HPI:   This is a pleasant 68 y.o.-year-old who last saw me on 04/22/2018.  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  She deferred steroid injections at that time.  She comes in today for reevaluation.    Problem 1:   Location: ***  Duration: ***  Associated symptoms: ***  Modifying factors/treatments: ***    PAST MEDICAL HISTORY:  Actinic keratosis  ??  SLE characterized by +ANA (negative ENA, dsDNA, normal C3/C4), fatigue, arthralgias, oral ulcers, sicca, fibromyalgia, followed by Rheum, on Benlysta infusions. Previously on Plaquenil, d/ced due to occular toxicity. ??  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 acetaminophen (TYLENOL) 500 MG tablet Take 500 mg by mouth every six (6) hours as needed for pain.     ??? albuterol (PROVENTIL HFA;VENTOLIN HFA) 90 mcg/actuation inhaler Inhale 2 puffs. As needed     ??? ammonium lactate (AMLACTIN) 12 % cream Apply topically once daily. 400 g 6   ??? ammonium lactate (LAC-HYDRIN) 12 % lotion As needed     ??? bacitracin-polymyxin b (POLYSPORIN) 500-10,000 unit/gram ointment Apply topically in the morning to ear. 14 g 2   ??? belimumab (BENLYSTA) 400 mg SolR Infuse 720 mg into a venous catheter every thirty (30) days.     ??? cholecalciferol, vitamin D3, (VITAMIN D3) 1,000 unit capsule Take 1,000 Units by mouth daily.     ??? clonazePAM (KLONOPIN) 1 MG tablet Take one tablet for sleep as needed at bed time 30 tablet 3   ??? clotrimazole (LOTRIMIN) 1 % cream Apply topically Two (2) times a day. 30 g 0   ??? cyanocobalamin 1000 MCG tablet Take 1,000 mcg by mouth daily.     ??? diclofenac sodium (VOLTAREN) 1 % gel Apply 2 g topically Four (4) times a day. 100 g 0   ??? DULoxetine (CYMBALTA) 60 MG capsule Take 1 capsule (60 mg total) by mouth daily. 90 capsule 1   ??? empty container Misc Use as directed 1 each PRN   ??? EPINEPHrine (EPIPEN) 0.3 mg/0.3 mL injection Inject 0.3 mL (0.3 mg total) into the muscle Take as directed. Note:carry at all times 1 each 3   ??? estradiol (ESTRACE) 0.01 % (0.1 mg/gram) vaginal cream Insert into the vagina.     ??? fluticasone propionate (FLONASE) 50 mcg/actuation nasal spray 2 sprays by Each Nare route Two (2) times a day. 16 mL prn   ??? gabapentin (NEURONTIN) 100 MG capsule Take 100 mg (1 cap) each morning and 300 mg (3 caps) each evening 360 capsule 3   ??? gabapentin (NEURONTIN) 300 MG capsule      ??? galcanezumab-gnlm (EMGALITY) 120 mg/mL injection Inject 1 ml (120 mg) under the skin every thirty (30) days. 1 mL 11   ??? hydroCHLOROthiazide (HYDRODIURIL) 25 MG tablet Take 0.5 tablets (12.5 mg total) by mouth daily. TAKE 0.5 TABLETS (12.5 MG TOTAL) BY MOUTH DAILY. 45 each 3   ??? levocetirizine (XYZAL) 5 MG tablet 1 daily prn allergy 30 tablet 2   ???  levothyroxine (SYNTHROID) 50 MCG tablet Take 1 tablet (50 mcg total) by mouth daily. 90 tablet 3   ??? lidocaine (LIDODERM) 5 % patch Place 1 patch on the skin daily as needed (12 hours on/12 hours off). Apply to affected area for 12 hours only each day (then remove patch) 60 patch 5   ??? multivitamin (TAB-A-VITE/THERAGRAN) per tablet Take 1 tablet by mouth daily.     ??? naproxen (NAPROSYN) 500 MG tablet Take 1 tablet (500 mg total) by mouth 2 (two) times a day with meals. TAKE 1 TABLET (500 MG TOTAL) BY MOUTH 2 (TWO) TIMES A DAY WITH MEALS. 60 tablet 11   ??? omega 3-dha-epa-fish oil 900 mg-360 mg- 455 mg-1,000 mg cap      ??? omeprazole (PRILOSEC) 40 MG capsule Take 1 capsule (40 mg total) by mouth daily. 90 capsule 3   ??? pyridoxine, vitamin B6, (B-6) 100 MG tablet Take 100 mg by mouth daily.     ??? SPONIX ARTHRITIS-MUSCLE PAIN 4-10-0.035 % srlo      ??? tiZANidine (ZANAFLEX) 2 MG tablet Take 0.5 tablets (1 mg total) by mouth nightly as needed (back pain). for up to 30 doses Tapering off. Plan to discontinue after 6-8 weeks. 30 tablet 1   ??? traMADol (ULTRAM) 50 mg tablet Take 0.5 tablets (25 mg total) by mouth daily as needed for pain or pain,severe (7-10). 15 tablet 0   ??? traZODone (DESYREL) 50 MG tablet Take 1 tablet (50 mg total) by mouth nightly. 90 tablet 1   ??? triamcinolone (KENALOG) 0.1 % ointment Apply topically in the evening to ear. 15 g 2     No current facility-administered medications on file prior to visit.        ALLERGIES:   Amoxicillin; Doxycycline; Ketorolac; Sulfa (sulfonamide antibiotics); Zocor [simvastatin]; and Statins-hmg-coa reductase inhibitors    SOCIAL HISTORY:  ***    FAMILY HISTORY:  ***    REVIEW OF SYSTEMS:  Baseline state of health. No recent illnesses. No other skin complaints.    PHYSICAL EXAMINATION:  Examination in the presence of female chaperone:  General: Well-developed, well-nourished. No acute distress. Neuro: Alert and oriented, answers questions appropriately.  Skin: Examination of the scalp, face, neck, chest, abdomen, back, bilateral upper extremities, bilateral lower extremities, palms, nails, and soles was performed and notable for the following:  ***

## 2018-05-13 NOTE — Unmapped (Signed)
[facility] Refill Coordination Note  Specialty Medication(s): Emgality 120mg /ml   Additional Medications shipped:      Olivia Stout, DOB: [birth year]  Phone: [phone] (home) [phone] (work), Alternate phone contact: N/A  Phone or address changes today?: No  All above HIPAA information was verified with patient.  Shipping Address: [address]  [place] [address]   Insurance changes? No    Completed refill call assessment today to schedule patient's medication shipment from the [facility] [phone]).      Confirmed the medication and dosage are correct and have not changed: Yes, regimen is correct and unchanged.    Confirmed patient started or stopped the following medications in the past month:  No, there are no changes reported at this time.    Are you tolerating your medication?:  [name] reports tolerating the medication.    ADHERENCE    (Below is required for Medicare Part B or Transplant patients only - per drug):   How many tablets were dispensed last month: 1ml  Patient currently has 0 remaining.    Did you miss any doses in the past 4 weeks? No missed doses reported.    FINANCIAL/SHIPPING    Delivery Scheduled: Yes, Expected medication delivery date: 022620     Medication will be delivered via Same Day Courier to the home address in [facility].    The patient will receive a drug information handout for each medication shipped and additional FDA Medication Guides as required.      Aquila did not have any additional questions at this time.    We will follow up with patient monthly for standard refill processing and delivery.      Thank you,  Antonietta Barcelona   [facility] Specialty Technician

## 2018-05-15 MED ORDER — GALCANEZUMAB-GNLM 120 MG/ML SUBCUTANEOUS PEN INJECTOR
SUBCUTANEOUS | 11 refills | 0 days | Status: CP
Start: 2018-05-15 — End: 2018-08-06
  Filled 2018-05-20: qty 1, 30d supply, fill #3

## 2018-05-20 ENCOUNTER — Ambulatory Visit: Admit: 2018-05-20 | Discharge: 2018-05-21 | Payer: MEDICARE

## 2018-05-20 DIAGNOSIS — H04129 Dry eye syndrome of unspecified lacrimal gland: Principal | ICD-10-CM

## 2018-05-20 DIAGNOSIS — H02889 Meibomian gland dysfunction of unspecified eye, unspecified eyelid: Secondary | ICD-10-CM

## 2018-05-20 MED FILL — EMGALITY PEN 120 MG/ML SUBCUTANEOUS PEN INJECTOR: 30 days supply | Qty: 1 | Fill #3 | Status: AC

## 2018-05-20 NOTE — Unmapped (Addendum)
You can use +1.00 or +1.25 or +1.50 for distance vision (over the counter).   You can use +3.00 or +3.50 for near vision (over the counter).     Humidifier.  Salogen might be helpful (ask Dr. Scarlette Calico).

## 2018-05-20 NOTE — Unmapped (Signed)
Work up: V/DEMS/T    68 y.o. female with past medical history of htn, mitral valve prolapse, SLE (followed by Dr. Scarlette Calico, also has h/o mantle zone lymphoma treated with Rituxan), hypothyroidism, hyperlipidemia, and h/o B12 deficiency (normal level 01/2018), migraine, fibromyalgia, chronic pain, and anxiety/depression     F/u pt, previous Dr. Earlene Plater f/u pt    #. Dry eye    DEMS today is 9, from 10 04/15/18.  Main symptoms are blurry vision, eye fatigue, burning, redness/itching.    Prior tx:   Doxycycline - caused a rash in her mouth  Restasis - burned her eyes   Xiidra - couldn't use   - AT's (Theratears and Refresh plus - she was using these at least 3x/day and they made her vision worse and made it difficult for her to see)  - Systane gel (she doesn't use this)  - plug LUL   - lid scrubs (not doing this)     Plan 04/15/18:  - Try a different brand of artificial tears, use 3-6x/day (can try Fresh Kote, Bion Tears, or Systane PF)  - WC's daily (with mask or clean sock/dry rice)  - Omega 3 2 grams daily   - Placed Vera 90 0.4x2 mm plugs RUL/LUL   - Has bilateral lower lid cautery    Impression/plan 05/20/18: She's very light sensitive, even in the morning upon awakening.  The left eye bothers her the most.  It hurts to look at the TV.  Things go blurry after looking at the TV.  She does not have a lot of PEE but does have a low tear volume.  She said the Systane PF tears I recommended cause her to have blurry vision so she can't use them.  There are 2 trichiatic lashes that are rubbing near the left inner canthus - she c/o pain in this area.   - Epilated two lashes left punctal region.  - I recommended trying Bion tears or Fresh Kote; pt says this would be too expensive currently.  - Pt requested that I place more plugs, discussed that I just placed dissolvable plugs 4 weeks ago and don't generally replace them for 3 months because they shouldn't be dissolved yet - given that this was essentially the only intervention that she would consider, I told her we could replace them early if there's nothing else we can do.  - I recommended autologous serum tears, pt said this would be too expensive currently.  - I discussed that it is possible that some of her eye pain is also secondary to migraine and her chronic pain syndrome, and her dry eye could also be exacerbated by some of her systemic medications.  - Discussed that there was a very small study in [place] where patients who could not afford autologous serum tears actually just pricked their finger (with a glucometer device) and placed a drop of their own blood in their eye, she said blood makes her feel weak and she could not do this.  - Discussed that she might consider using a humidifier at home, and asking Dr. Scarlette Calico about a medicine by mouth for dry eye and dry mouth (Salogen)  - Discussed possibly using moisture chamber glasses or a scleral lens and patient was not interested in either of these       #. Nuclear sclerotic cataracts, mild     #. H/o plaquenil use, was stopped 06/2016 due to concerns for early toxicity  - followed by Dr. Waunita Schooner, f/u 12/2018    #.  Choroidal nevus OD  - followed by Dr. Waunita Schooner, f/u 12/2018    #. Hyperopia, pt requests new MRx today.    - discussed that with dry eye it is generally not a good time to recheck MRx but patient insists, MRx was given (she wants to get new frames that will be lighter on her face, her current ones are causing eye pain)   - also discussed she could use OTC readers for distance (~ + 1.50) and for near (~ +3.50) if she'd rather    Return for F/u first avail Dr. Jimmye Norman OR 4wks Dr. Delora Fuel ok.    Visit time: In excess of 25 (E4) minutes, more than 50% of which was spent face-to-face in counseling and education activities regarding the diagnoses above.    I examined this patient and agree with the assessment and plan.  Bradd Canary Armando Reichert, MD    CC: Dr. Rose Fillers, Dr. Scarlette Calico

## 2018-05-21 NOTE — Unmapped (Signed)
[facility] NEUROLOGY REPORT    Date: May 22, 2018  Patient Name: Olivia Stout  MRN: [id number]  PCP:  Lolita Lenz  Time in with patient:   Time out with patient:     Prior visit with this neurologist:  April 24, 2018; March 27, 2018; February 23, 2018; January 26, 2018; 12/17/2017; 08/22/2017; 06/24/2017; 05/30/2017; 05/27/2017; 04/29/2017  Assessment and Plan/ Clinical Decision Making          1. Chronic migraine    LASHAN MACIAS is a 68 y.o. with chronic migraine- now on month #4 of Galcanezumab- unable to self-administer due to anxiety and needle phobia.   Tried Botox- no improvement subjectively.   Has initiated care with [facility].  Headaches are much improved- calendar reviewed (see scanned- sent via Migraine Buddy App)    PLAN:   ?? Continue to keep HA log- sMigraine Buddy.   ?? Addressed MOH. At this time would like her to consider avoiding Naprosyn and other pain medications.   ?? She has not tolerated rizatriptan or sumatriptan due to side effects. No further triptans offered.  ?? Consider eTNS or VNS for migraine- not covered by insurance- out of pocket cost is high- given her information about the site.   ?? Discussed Lasmiditan- dizziness is not a desirable side effect.     PROCEDURE NOTE:   Left anterior thigh cleaned. Local icepack. Identified medication. Injected to left anterior thigh. No complications.   - galcanezumab-gnlm (EMGALITY) injection 120 mg  No side effects. Last month had a bruise- see MAR    HEALTH EDUCATION/HEALTH LITERACY: PATIENT EDUCATION was provided. Reviewed the differential diagnosis, plan of care in detail, as well as other elements as detailed above. The benefits and risks of each of the above procedures, benefits and alternative options were reviewed with the patient.   Medication Management: There is a high risk for medication side effects, which will require ongoing monitoring and dose adjustment. Medication side effect profile was reviewed in detail with patient. Medication safety and drug interactions reviewed on ERx.   Patient Counselling: All the patient's questions and concerns were addressed to their stated satisfaction .    Barriers to Care: Multiple factors including depression, comorbid medical conditions, ongoing biological therapy, cost of medications and cost of care, access to care.Very anxious.   Total face-to-face time included: 15 minutes > 50 % in direct consultation reviewing details of history, examination and data findings, discussing my clinical reasoning and clinical impression, as well as a review of my recommendations for a plan of treatment as documented above. Additional time was provided to address alternative options for care, health literacy regarding the differential diagnosis, testing and medication options, the benefits of current plan as well as an opportunity to address all the patient's questions and concerns to their reported satisfaction. A written summary was provided to the patient at the time of the visit, as well as instructions on how to access My Chart. My contact information was provided along with instructions on how to reach the administrative office and the clinic.     Return in about 4 weeks (around 06/19/2018) for Emgality Injection- Nurses visit only. .         Subjective:          SOURCE: The history is obtained from the patient who appears to be a vague historian. She has been communicating with me via MyChart. She has a hard time answering questions even with direct questions. Tangential in answering  questions. Additional history is obtained from review of available medical records. Open-ended and close-ended questions, as well as questionnaire data and review of EPIC chart was used to obtain details of the history.    History of Present Illness:     Ms. Olivia Stout is a 68 y.o. right handed female seen in followup for evaluation of Medication Refill      CURRENT VISIT: 05/22/18  Fewer headache days= ~ once a week. Reviewed HA calendar.   Dealing with dry eye.   No side effects from Emgality injection.     PRIOR VISIT 04/24/18  Here for Emgality injection #4- too phobic and anxious to self administer. No one else can provide drug.   Doing well with reduced frequency, intensity of HA- also has HA -free days for the first time.   No new problems- had some bruising Left Ant Thigh- resolved.   Has seen Derm and Eye- stable. Monthly infusions from Rheumatology.     PRIOR VISITS: March 27, 2018   Here for [employer] #3.   Headache Calendar reviewed on Migraine Buddy- unable to send it- Having a headache approx QOD- a huge improvement from daily. Feels that the medication does best immediately after and worse closer to next injection.   No new issues. Doing well overall.   NO site reactions.   Has Eye appt pending.       02/23/2018  Here for Emgality #2. Feels a little better- ear lesion is healing slowly. Had biopsy.   No new problems.   Headache Calendar reviewed; [email or web address]     She has a strong family history of migraines she has 1 daughter with headaches a sister with headaches and her father had severe headaches.  She has no history of heart disease.  Other studies/data: normal brain MRI/MRA for same indication in 2006 at [facility]- results reviewed.     Sleep Study: no  Imaging: yes    TRAVEL HISTORY: [place]  CHILDHOOD HISTORY: Headaches in her teens.    Past Medical History:   Diagnosis Date   ??? Allergy desensitization therapy    ??? Anxiety    ??? Arthritis    ??? Back pain, chronic 2003    pinched nerve   ??? Depression    ??? Disease of thyroid gland    ??? Dry eyes     Had permanent cautery lower canula, tried plugs  and partial but caused waatering.   ??? Dysphagia    ??? Dysplastic nevi    ??? Eye trauma     wood chip with corneal abrasion right eye.  metal right eye removed also with abrasion   ??? Fibromyalgia    ??? Fibromyalgia, primary    ??? GERD (gastroesophageal reflux disease)    ??? High cholesterol    ??? History of gastric ulcer    ??? History of underactive thyroid    ??? Hypertension     watching   ??? IBS (irritable bowel syndrome)    ??? Long term prescription benzodiazepine use 06/29/2017   ??? Lupus (CMS-HCC)    ??? Lymphoma (CMS-HCC)     in remission; s/p chemotherapy - NON H[facility]GKINS   ??? Major depressive disorder    ??? Mixed stress and urge urinary incontinence 02/08/2013   ??? Sjoegren syndrome    ??? Urinary, incontinence, stress female    ??? Vaginal prolapse          Past Surgical History:   Procedure Laterality Date   ??? BACK  SURGERY  07/27/2015   ??? ESOPHAGEAL DILATION     ??? FRACTURE SURGERY      left forearm   ??? HYSTERECTOMY     ??? KNEE SURGERY     ??? LAPAROSCOPIC NISSEN FUNDOPLICATION     ??? PR ANAL PRESSURE RECORD Left 12/01/2012    Procedure: ANORECTAL MANOMETRY;  Surgeon: None None;  [facility];  Service: Gastroenterology   ??? PR BREATH HYDROGEN TEST N/A 12/01/2012    Procedure: BREATH HYDROGEN TEST;  Surgeon: None None;  [facility];  Service: Gastroenterology   ??? SKIN BIOPSY     ??? SPINE SURGERY     ??? TAH and bladder surgery      partial   ??? TONSILLECTOMY         Social History     Social History Narrative   ??? Not on file       Social History     Tobacco Use   Smoking Status Never Smoker   Smokeless Tobacco Never Used         reports no history of alcohol use.      Family History   Problem Relation Age of Onset   ??? Diabetes Mother    ??? Crohn's disease Mother    ??? Emphysema Mother    ??? Glaucoma Mother    ??? Asthma Mother    ??? Osteoporosis Mother    ??? Parkinsonism Mother    ??? Stroke Father    ??? Heart disease Father    ??? Cancer Father    ??? Migraines Father    ??? Hypertension Sister    ??? Migraines Sister    ??? Migraines Daughter    ??? No Known Problems Son    ??? No Known Problems Son    ??? ADD / ADHD Neg Hx    ??? Alcohol abuse Neg Hx    ??? Anxiety disorder Neg Hx    ??? Bipolar disorder Neg Hx    ??? Dementia Neg Hx    ??? Depression Neg Hx    ??? Drug abuse Neg Hx    ??? OCD Neg Hx    ??? Paranoid behavior Neg Hx    ??? Physical abuse Neg Hx    ??? Schizophrenia Neg Hx    ??? Sexual abuse Neg Hx    ??? Seizures Neg Hx    ??? Macular degeneration Neg Hx    ??? Blindness Neg Hx    ??? Melanoma Neg Hx    ??? Basal cell carcinoma Neg Hx    ??? Squamous cell carcinoma Neg Hx          Allergies   Allergen Reactions   ??? Amoxicillin Swelling     Sweating and swelling in throat      ??? Doxycycline Rash     Caused blisters in her mouth.   ??? Ketorolac Shortness Of Breath   ??? Sulfa (Sulfonamide Antibiotics) Other (See Comments)     welts in my mouth   ??? Zocor [Simvastatin] Other (See Comments)     Had issues w/ bearing weight and pain in legs. *Cannot have any statins at all.*     ??? Statins-Hmg-Coa Reductase Inhibitors Other (See Comments)     Leg paralysis  Leg paralysis       Current Outpatient Medications   Medication Sig Dispense Refill   ??? acetaminophen (TYLENOL) 500 MG tablet Take 500 mg by mouth every six (6) hours as needed for  pain.     ??? albuterol (PROVENTIL HFA;VENTOLIN HFA) 90 mcg/actuation inhaler Inhale 2 puffs. As needed     ??? ammonium lactate (AMLACTIN) 12 % cream Apply topically once daily. 400 g 6   ??? ammonium lactate (LAC-HYDRIN) 12 % lotion As needed     ??? bacitracin-polymyxin b (POLYSPORIN) 500-10,000 unit/gram ointment Apply topically in the morning to ear. 14 g 2   ??? belimumab (BENLYSTA) 400 mg SolR Infuse 720 mg into a venous catheter every thirty (30) days.     ??? cholecalciferol, vitamin D3, (VITAMIN D3) 1,000 unit capsule Take 1,000 Units by mouth daily.     ??? clonazePAM (KLONOPIN) 1 MG tablet Take one tablet for sleep as needed at bed time 30 tablet 3   ??? clotrimazole (LOTRIMIN) 1 % cream Apply topically Two (2) times a day. 30 g 0   ??? diclofenac sodium (VOLTAREN) 1 % gel Apply 2 g topically Four (4) times a day. 100 g 0   ??? DULoxetine (CYMBALTA) 60 MG capsule Take 1 capsule (60 mg total) by mouth daily. 90 capsule 1   ??? empty container Misc Use as directed 1 each PRN   ??? EPINEPHrine (EPIPEN) 0.3 mg/0.3 mL injection Inject 0.3 mL (0.3 mg total) into the muscle Take as directed. Note:carry at all times 1 each 3   ??? estradiol (ESTRACE) 0.01 % (0.1 mg/gram) vaginal cream Insert into the vagina.     ??? fluticasone propionate (FLONASE) 50 mcg/actuation nasal spray 2 sprays by Each Nare route Two (2) times a day. 16 mL prn   ??? gabapentin (NEURONTIN) 100 MG capsule Take 100 mg (1 cap) each morning and 300 mg (3 caps) each evening 360 capsule 3   ??? gabapentin (NEURONTIN) 300 MG capsule      ??? galcanezumab-gnlm (EMGALITY) 120 mg/mL injection Inject 1 ml (120 mg) under the skin every thirty (30) days. 1 mL 11   ??? galcanezumab-gnlm (EMGALITY) 120 mg/mL injection Inject 1 ml (120 mg) under the skin every twenty-eight (28) days. 1 mL 11   ??? hydroCHLOROthiazide (HYDRODIURIL) 25 MG tablet Take 0.5 tablets (12.5 mg total) by mouth daily. TAKE 0.5 TABLETS (12.5 MG TOTAL) BY MOUTH DAILY. 45 each 3   ??? levocetirizine (XYZAL) 5 MG tablet 1 daily prn allergy 30 tablet 2   ??? levothyroxine (SYNTHROID) 50 MCG tablet Take 1 tablet (50 mcg total) by mouth daily. 90 tablet 3   ??? lidocaine (LIDODERM) 5 % patch Place 1 patch on the skin daily as needed (12 hours on/12 hours off). Apply to affected area for 12 hours only each day (then remove patch) 60 patch 5   ??? multivitamin (TAB-A-VITE/THERAGRAN) per tablet Take 1 tablet by mouth daily.     ??? naproxen (NAPROSYN) 500 MG tablet Take 1 tablet (500 mg total) by mouth 2 (two) times a day with meals. TAKE 1 TABLET (500 MG TOTAL) BY MOUTH 2 (TWO) TIMES A DAY WITH MEALS. 60 tablet 11   ??? omeprazole (PRILOSEC) 40 MG capsule Take 1 capsule (40 mg total) by mouth daily. 90 capsule 3   ??? pyridoxine, vitamin B6, (B-6) 100 MG tablet Take 100 mg by mouth daily.     ??? SPONIX ARTHRITIS-MUSCLE PAIN 4-10-0.035 % srlo      ??? tiZANidine (ZANAFLEX) 2 MG tablet Take 0.5 tablets (1 mg total) by mouth nightly as needed (back pain). for up to 30 doses Tapering off. Plan to discontinue after 6-8 weeks. 30 tablet 1   ??? traMADol (  ULTRAM) 50 mg tablet Take 0.5 tablets (25 mg total) by mouth daily as needed for pain or pain,severe (7-10). 15 tablet 0   ??? traZODone (DESYREL) 50 MG tablet Take 1 tablet (50 mg total) by mouth nightly. 90 tablet 1   ??? triamcinolone (KENALOG) 0.1 % ointment Apply topically in the evening to ear. 15 g 2     No current facility-administered medications for this visit.             Objective:        GENERAL PHYSICAL EXAM:    Vital Signs: Reviewed today.   BP 149/92  - Pulse 81  - Temp 36.6 ??C (97.8 ??F) (Oral)  - Resp 16  - Wt 73.4 kg (161 lb 14.4 oz)  - SpO2 97%  - BMI 29.61 kg/m??   Estimated body mass index is 29.61 kg/m?? as calculated from the following:    Height as of 04/24/18: 157.5 cm (5' 2).    Weight as of this encounter: 73.4 kg (161 lb 14.4 oz).  Facility age limit for growth percentiles is 20 years.  BP meds taken in the evening.     Wt Readings from Last 8 Encounters:   05/22/18 73.4 kg (161 lb 14.4 oz)   04/29/18 71.2 kg (157 lb)   04/24/18 72.9 kg (160 lb 11.2 oz)   04/08/18 (P) 71.2 kg (157 lb)   04/01/18 70.8 kg (156 lb)   03/27/18 72.6 kg (160 lb 0.9 oz)   03/04/18 72.6 kg (160 lb)   02/25/18 73 kg (161 lb)       General Appearance: The patient was well-groomed and neat.   Head:  Atraumatic, normocephalic.   Right ear examined- clean dressing in situ.     NEUROLOGICAL EXAMINATION   Mental Status  The patient is alert and oriented to time, place and person.   Attention adequate throughout the examination.   Speech and language were normal at the bedside.      Gait: Normal gait and stance.        Diagnostic Studies and Review of Records:     DATA   I have independently reviewed the studies as noted.   1. Medical Records: reviewed-including recent [facility] Psychiatry:   prior neurology visit- EMG with Dr. Vickki Muff in 2011. CONCLUSION- Normal study. There is no evidence for a mononeuropathy or cervical radiculopathy.    2. Radiology: MRI/Mra Head Wo Contrast 05/07/2017--Normal MRI/MRA of the brain.  Reports reviewed.     3.Labs: reviewed ,  Lab Results   Component Value Date    WBC 5.1 04/01/2018    HGB 15.1 04/01/2018    HCT 46.7 (H) 04/01/2018    PLT 278 04/01/2018       Lab Results   Component Value Date    NA 137 12/31/2017    K 5.0 12/31/2017    CL 99 12/31/2017    CO2 28.0 12/31/2017    BUN 22 (H) 04/01/2018    CREATININE 0.81 04/01/2018    GLU 83 12/31/2017    CALCIUM 10.6 (H) 12/31/2017       Lab Results   Component Value Date    BILITOT 0.8 12/31/2017    PROT 8.1 12/31/2017    ALBUMIN 4.9 12/31/2017    ALT 38 12/31/2017    AST 37 12/31/2017    ALKPHOS 74 12/31/2017    GGT 42 12/31/2011     Dr. Hollie Beach    CC: Lolita Lenz, MD  Dictated using voice-activated software. Typographical errors may exist.

## 2018-05-22 ENCOUNTER — Ambulatory Visit: Admit: 2018-05-22 | Discharge: 2018-05-23 | Payer: MEDICARE | Attending: Neurology | Primary: Neurology

## 2018-05-22 DIAGNOSIS — G43709 Chronic migraine without aura, not intractable, without status migrainosus: Principal | ICD-10-CM

## 2018-05-22 NOTE — Unmapped (Addendum)
You received EMGALITY today.     We will inject again in 28-30 days.       Warmest Regards,    Dr. Hollie Beach  Board Certified: Adult Neurology  [facility] [facility] of General Medicine and Clinical Epidemiology  [place], [place]      It has been my pleasure to participate in your care.   You may reach me via MyChart, phone or fax.     Please note that while I will try to answer messages in a timely manner, all urgent issues should be directed to your PCP.       Helpful Contact Information :  ?? Appointments/ Internal Medicine Clinic: [phone]    ?? HIPPA Compliant Facsimile: [phone]  ?? Same Day Clinic:  [phone].  8am-5pm Monday through Friday   ?? After Hours:  [phone].  A nurse will answer and can help you decide what kind of medication attention you need.   ?? [facility]:  [phone].  If you are sick, but not injured:  9am-8pm Mon-Sun.  [address], [address], [place], [place] off of [address].  All walk-ins accepted.    ?? [facility] at the [facility]:  [phone].  7am-9pm Mon-Fri; 12pm-5pm [address]  [address], [place] [address].  All walk-ins accepted.    ??    Thank you for choosing [employer].

## 2018-05-24 DIAGNOSIS — M792 Neuralgia and neuritis, unspecified: Principal | ICD-10-CM

## 2018-05-25 MED ORDER — TIZANIDINE 2 MG TABLET
ORAL_TABLET | 1 refills | 0 days | Status: CP
Start: 2018-05-25 — End: 2018-06-24

## 2018-05-27 ENCOUNTER — Ambulatory Visit: Admit: 2018-05-27 | Discharge: 2018-05-27 | Payer: MEDICARE

## 2018-05-27 ENCOUNTER — Institutional Professional Consult (permissible substitution): Admit: 2018-05-27 | Discharge: 2018-05-27 | Payer: MEDICARE

## 2018-05-27 DIAGNOSIS — M3219 Other organ or system involvement in systemic lupus erythematosus: Principal | ICD-10-CM

## 2018-05-27 DIAGNOSIS — K219 Gastro-esophageal reflux disease without esophagitis: Principal | ICD-10-CM

## 2018-05-27 DIAGNOSIS — J3089 Other allergic rhinitis: Principal | ICD-10-CM

## 2018-05-27 DIAGNOSIS — K589 Irritable bowel syndrome without diarrhea: Principal | ICD-10-CM

## 2018-05-27 DIAGNOSIS — N3946 Mixed incontinence: Principal | ICD-10-CM

## 2018-05-27 DIAGNOSIS — M3500 Sicca syndrome, unspecified: Principal | ICD-10-CM

## 2018-05-27 DIAGNOSIS — N8110 Cystocele, unspecified: Principal | ICD-10-CM

## 2018-05-27 DIAGNOSIS — D239 Other benign neoplasm of skin, unspecified: Principal | ICD-10-CM

## 2018-05-27 DIAGNOSIS — F329 Major depressive disorder, single episode, unspecified: Principal | ICD-10-CM

## 2018-05-27 DIAGNOSIS — R1310 Dysphagia, unspecified: Principal | ICD-10-CM

## 2018-05-27 DIAGNOSIS — F419 Anxiety disorder, unspecified: Principal | ICD-10-CM

## 2018-05-27 DIAGNOSIS — M797 Fibromyalgia: Principal | ICD-10-CM

## 2018-05-27 DIAGNOSIS — H04123 Dry eye syndrome of bilateral lacrimal glands: Principal | ICD-10-CM

## 2018-05-27 DIAGNOSIS — E7800 Pure hypercholesterolemia, unspecified: Principal | ICD-10-CM

## 2018-05-27 DIAGNOSIS — M1990 Unspecified osteoarthritis, unspecified site: Principal | ICD-10-CM

## 2018-05-27 DIAGNOSIS — Z8639 Personal history of other endocrine, nutritional and metabolic disease: Principal | ICD-10-CM

## 2018-05-27 DIAGNOSIS — G8929 Other chronic pain: Principal | ICD-10-CM

## 2018-05-27 DIAGNOSIS — S0590XA Unspecified injury of unspecified eye and orbit, initial encounter: Principal | ICD-10-CM

## 2018-05-27 DIAGNOSIS — Z8719 Personal history of other diseases of the digestive system: Principal | ICD-10-CM

## 2018-05-27 DIAGNOSIS — E079 Disorder of thyroid, unspecified: Principal | ICD-10-CM

## 2018-05-27 DIAGNOSIS — M549 Dorsalgia, unspecified: Principal | ICD-10-CM

## 2018-05-27 DIAGNOSIS — Z516 Encounter for desensitization to allergens: Principal | ICD-10-CM

## 2018-05-27 DIAGNOSIS — Z79899 Other long term (current) drug therapy: Principal | ICD-10-CM

## 2018-05-27 DIAGNOSIS — I10 Essential (primary) hypertension: Principal | ICD-10-CM

## 2018-05-27 DIAGNOSIS — N393 Stress incontinence (female) (male): Principal | ICD-10-CM

## 2018-05-27 DIAGNOSIS — M329 Systemic lupus erythematosus, unspecified: Principal | ICD-10-CM

## 2018-05-27 DIAGNOSIS — C8590 Non-Hodgkin lymphoma, unspecified, unspecified site: Principal | ICD-10-CM

## 2018-05-27 MED ORDER — GABAPENTIN 300 MG CAPSULE
ORAL_CAPSULE | Freq: Every evening | ORAL | 3 refills | 0 days | Status: CP
Start: 2018-05-27 — End: ?

## 2018-05-27 MED ORDER — GABAPENTIN 100 MG CAPSULE
ORAL_CAPSULE | Freq: Every morning | ORAL | 3 refills | 0.00000 days | Status: CP
Start: 2018-05-27 — End: ?

## 2018-05-27 NOTE — Unmapped (Signed)
Reason for visit: F/u SLE     HPI: 68 y.o.white female with a longstanding history of SLE characterized by +ANA (negative ENA, negative dsNDA, normal C3/C4), fatigue, arthralgias, oral ulcers, sicca. She has been treated with HCQ and benlysta started in 2012 for her SLE. 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 Fibromyalgia being treated with gabapentin and cymbalta.     Interim history:  Presents today for f/u.     She is following neurology for her chronic migraines. Botox did not help. She is now on emgality.   She feels that her eyes are getting worse with respect to dry eyes. Her eye sockets hurt all the time. Blurred vision often which has been related to dry eyes. She is not really interested in doing new eye drops because she feels like the eyes get used to them and then they burn her eyes. She has f/u with ophthalmology coming up. She also got punctal plugs, but she doesn't think this is helpful.     She has had a sore throat for 3-4 wks. Also hurts in her ears and down into her throat.     Also saw a dermatologist for a rash on her ear that will not heal. Seems to be improving now.     She notes more pain in her fingers. Hard to open up things at home. Also more pain in the shoulders and feet. She wonders about using a medication like rinvoq, which she saw a commercial about. We discussed that this is a medication for rheumatoid arthritis and I would not recommend this for her.     She states that she has been getting cramping in the stomach for about 6 hours 2-3 days after her benlysta infusions. I discussed that we could have a trial off the benlysta, but she is not wanting to pursue this.       Review of Systems: Balance of 12 review of systems otherwise negative as per HPI   Record Review: Available records were reviewed, including pertinent office visits, labs, and imaging.          Past Medical History:   Diagnosis Date   ??? Allergy desensitization therapy    ??? Anxiety    ??? Arthritis    ??? Back pain, chronic 2003    pinched nerve   ??? Depression    ??? Disease of thyroid gland    ??? Dry eyes     Had permanent cautery lower canula, tried plugs  and partial but caused waatering.   ??? Dysphagia    ??? Dysplastic nevi    ??? Eye trauma     wood chip with corneal abrasion right eye.  metal right eye removed also with abrasion   ??? Fibromyalgia    ??? Fibromyalgia, primary    ??? GERD (gastroesophageal reflux disease)    ??? High cholesterol    ??? History of gastric ulcer    ??? History of underactive thyroid    ??? Hypertension     watching   ??? IBS (irritable bowel syndrome)    ??? Long term prescription benzodiazepine use 06/29/2017   ??? Lupus (CMS-HCC)    ??? Lymphoma (CMS-HCC)     in remission; s/p chemotherapy - NON H[facility]GKINS   ??? Major depressive disorder    ??? Mixed stress and urge urinary incontinence 02/08/2013   ???  Sjoegren syndrome    ??? Urinary, incontinence, stress female    ??? Vaginal prolapse          Past Surgical History:   Procedure Laterality Date   ??? BACK SURGERY  07/27/2015   ??? ESOPHAGEAL DILATION     ??? FRACTURE SURGERY      left forearm   ??? HYSTERECTOMY     ??? KNEE SURGERY     ??? LAPAROSCOPIC NISSEN FUNDOPLICATION     ??? PR ANAL PRESSURE RECORD Left 12/01/2012    Procedure: ANORECTAL MANOMETRY;  Surgeon: None None;  [facility];  Service: Gastroenterology   ??? PR BREATH HYDROGEN TEST N/A 12/01/2012    Procedure: BREATH HYDROGEN TEST;  Surgeon: None None;  [facility];  Service: Gastroenterology   ??? SKIN BIOPSY     ??? SPINE SURGERY     ??? TAH and bladder surgery      partial   ??? TONSILLECTOMY         Current Medications:     Current Outpatient Medications   Medication Sig Dispense Refill   ??? acetaminophen (TYLENOL) 500 MG tablet Take 500 mg by mouth every six (6) hours as needed for pain.     ??? albuterol (PROVENTIL HFA;VENTOLIN HFA) 90 mcg/actuation inhaler Inhale 2 puffs. As needed     ??? ammonium lactate (AMLACTIN) 12 % cream Apply topically once daily. 400 g 6   ??? ammonium lactate (LAC-HYDRIN) 12 % lotion As needed     ??? bacitracin-polymyxin b (POLYSPORIN) 500-10,000 unit/gram ointment Apply topically in the morning to ear. 14 g 2   ??? belimumab (BENLYSTA) 400 mg SolR Infuse 720 mg into a venous catheter every thirty (30) days.     ??? cholecalciferol, vitamin D3, (VITAMIN D3) 1,000 unit capsule Take 1,000 Units by mouth daily.     ??? clonazePAM (KLONOPIN) 1 MG tablet Take one tablet for sleep as needed at bed time 30 tablet 3   ??? clotrimazole (LOTRIMIN) 1 % cream Apply topically Two (2) times a day. 30 g 0   ??? diclofenac sodium (VOLTAREN) 1 % gel Apply 2 g topically Four (4) times a day. 100 g 0   ??? DULoxetine (CYMBALTA) 60 MG capsule Take 1 capsule (60 mg total) by mouth daily. 90 capsule 1   ??? empty container Misc Use as directed 1 each PRN   ??? EPINEPHrine (EPIPEN) 0.3 mg/0.3 mL injection Inject 0.3 mL (0.3 mg total) into the muscle Take as directed. Note:carry at all times 1 each 3   ??? estradiol (ESTRACE) 0.01 % (0.1 mg/gram) vaginal cream Insert into the vagina.     ??? fluticasone propionate (FLONASE) 50 mcg/actuation nasal spray 2 sprays by Each Nare route Two (2) times a day. 16 mL prn   ??? gabapentin (NEURONTIN) 100 MG capsule Take 100 mg (1 cap) each morning and 300 mg (3 caps) each evening 360 capsule 3   ??? gabapentin (NEURONTIN) 300 MG capsule      ??? galcanezumab-gnlm (EMGALITY) 120 mg/mL injection Inject 1 ml (120 mg) under the skin every thirty (30) days. 1 mL 11   ??? galcanezumab-gnlm (EMGALITY) 120 mg/mL injection Inject 1 ml (120 mg) under the skin every twenty-eight (28) days. 1 mL 11   ??? hydroCHLOROthiazide (HYDRODIURIL) 25 MG tablet Take 0.5 tablets (12.5 mg total) by mouth daily. TAKE 0.5 TABLETS (12.5 MG TOTAL) BY MOUTH DAILY. 45 each 3   ??? levocetirizine (XYZAL) 5 MG tablet 1  daily prn allergy 30 tablet 2   ??? levothyroxine (SYNTHROID) 50 MCG tablet Take 1 tablet (50 mcg total) by mouth daily. 90 tablet 3   ??? lidocaine (LIDODERM) 5 % patch Place 1 patch on the skin daily as needed (12 hours on/12 hours off). Apply to affected area for 12 hours only each day (then remove patch) 60 patch 5   ??? multivitamin (TAB-A-VITE/THERAGRAN) per tablet Take 1 tablet by mouth daily.     ??? naproxen (NAPROSYN) 500 MG tablet Take 1 tablet (500 mg total) by mouth 2 (two) times a day with meals. TAKE 1 TABLET (500 MG TOTAL) BY MOUTH 2 (TWO) TIMES A DAY WITH MEALS. 60 tablet 11   ??? pyridoxine, vitamin B6, (B-6) 100 MG tablet Take 100 mg by mouth daily.     ??? SPONIX ARTHRITIS-MUSCLE PAIN 4-10-0.035 % srlo      ??? tiZANidine (ZANAFLEX) 2 MG tablet TAKE 1/2 TABLET BY MOUTH NIGHTLY AS NEEDED. DISCONTINUE AFTER 6-8 WEEKS 30 tablet 1   ??? traMADol (ULTRAM) 50 mg tablet Take 0.5 tablets (25 mg total) by mouth daily as needed for pain or pain,severe (7-10). 15 tablet 0   ??? traZODone (DESYREL) 50 MG tablet Take 1 tablet (50 mg total) by mouth nightly. 90 tablet 1   ??? triamcinolone (KENALOG) 0.1 % ointment Apply topically in the evening to ear. 15 g 2   ??? omeprazole (PRILOSEC) 40 MG capsule Take 1 capsule (40 mg total) by mouth daily. 90 capsule 3     No current facility-administered medications for this visit.          Physical Exam:  Vitals:    05/27/18 1427   BP: 172/77   BP Site: L Arm   BP Position: Sitting   BP Cuff Size: Medium   Pulse: 77   Temp: 35.8 ??C (96.5 ??F)   TempSrc: Oral   Weight: 73 kg (161 lb)   Height: 157.5 cm (5' 2.01)     Wt Readings from Last 6 Encounters:   05/27/18 73 kg (161 lb)   05/27/18 73 kg (161 lb)   05/22/18 73.4 kg (161 lb 14.4 oz)   04/29/18 71.2 kg (157 lb)   04/24/18 72.9 kg (160 lb 11.2 oz)   04/08/18 (P) 71.2 kg (157 lb)       General:    68 y.o.female in no acute distress, WDWN   Eyes:   PERRL, conjunctiva and sclera not inflamed. Tears appear adequate.    ENT:   No oropharyngeal lesions. Mucous membranes moist. No parotid swelling.    Lymph:   No masses or cervical lymphadenopathy.    Heart:  Regular rate and rhythm. No murmur, rub, or gallop.    Lungs:  Clear to auscultation. Normal respiratory effort.    Musculoskeletal:   General: Ambulates w/o assistance, diffuse muscular tenderness  Hands: No synovitis of the hands  Elbows: Painful full range of motion bilaterally without effusion  Shoulders: Reduced abduction and flexion to 90 degrees due to pain.   Hips: Painful range of motion with reduced flexion  Knees: Painful range of motion with crepitus bilaterally, no effusion  Ankles: Tenderness bilaterally without synovitis  Feet: Pain on MTP squeeze bilaterally.     Neurological:  CN 2-12 grossly intact.    Psych:  Mildly anxious    Skin:   no malar erythema, no discoid lesions, no alopecia.             Assessment and Plan:  1. Other systemic lupus erythematosus with other organ involvement (CMS-HCC)  No clinical evidence for active SLE. Serologically pt has only +ANA, and in available records her ENA, dsDNA, and complements have been normal.  No indication for escalation of therapy today.  Monitoring labs are done with infusions. Reviewed today, stable. She declines to provide a urine sample today for UA/UPC, which is fine.     2. Fibromyalgia  Symptomatic but stable. RF gabapentin per pt request.       HCM:   - PCV13 Status: 03/27/2017  - PPSV 23 Status: 02/19/2011  - Annual Influenza vaccine. Status: 919  - Bone health: not on prednisone   - Plaquenil eye exam: No longer taking Plaquenil  - Contraception: hysterectomy      >25 min spent in consultation with pt, >50% of which was spent discussing diagnosis and treatment options.   RTC 4 mo with Dr Scarlette Calico

## 2018-05-27 NOTE — Unmapped (Signed)
1130 Patient in today for Benlysta infusion. Patient has no s/s of infection. No issues from the last infusion.   1228??Benlysta started ,patient instructed to use call bell /call nurse for any s/s of unsual symptoms during infusion. Patient educated on possible s/s of reaction,such as chestpain ,itching,shortness of breath lightheadedness and any kind of discomfort. Patient verbalized understanding...  1349??Infusion completed and well tolerated.  1400??Benlysta 730.4mg /250??ml??NS infused over 1hr and??21??min.??per protocol . Pt alert and oriented, offered no complaints during infusion. IV flushed with 10ml NS . Patient discharged in stable condition.

## 2018-06-01 DIAGNOSIS — Z8659 Personal history of other mental and behavioral disorders: Principal | ICD-10-CM

## 2018-06-01 DIAGNOSIS — F3341 Major depressive disorder, recurrent, in partial remission: Principal | ICD-10-CM

## 2018-06-01 DIAGNOSIS — G43109 Migraine with aura, not intractable, without status migrainosus: Principal | ICD-10-CM

## 2018-06-01 DIAGNOSIS — M797 Fibromyalgia: Principal | ICD-10-CM

## 2018-06-01 DIAGNOSIS — G894 Chronic pain syndrome: Principal | ICD-10-CM

## 2018-06-01 DIAGNOSIS — Z9149 Other personal history of psychological trauma, not elsewhere classified: Principal | ICD-10-CM

## 2018-06-03 MED ORDER — DULOXETINE 60 MG CAPSULE,DELAYED RELEASE
ORAL_CAPSULE | 1 refills | 0 days | Status: CP
Start: 2018-06-03 — End: 2018-08-08

## 2018-06-09 NOTE — Unmapped (Signed)
[facility] Refill Coordination Note    Specialty Medication(s) to be Shipped:   General Specialty: Emgality 120mg /ml     Other medication(s) to be shipped:      Olivia Stout, DOB: 05-12-1950  Phone: There are no phone numbers on file.      All above HIPAA information was verified with patient.     Completed refill call assessment today to schedule patient's medication shipment from the [facility] [phone]).       Specialty medication(s) and dose(s) confirmed: Regimen is correct and unchanged.   Changes to medications: Olivia Stout reports no changes reported at this time.  Changes to insurance: No  Questions for the pharmacist: No    Confirmed patient received Welcome Packet with first shipment. The patient will receive a drug information handout for each medication shipped and additional FDA Medication Guides as required.       DISEASE/MEDICATION-SPECIFIC INFORMATION        N/A    SPECIALTY MEDICATION ADHERENCE     Medication Adherence    Specialty Medication:  Emgality 120mg /ml                emgality 120 mg/ml: 0 days of medicine on hand       SHIPPING     Shipping address confirmed in Epic.     Delivery Scheduled: Yes, Expected medication delivery date: 032620.     Medication will be delivered via Clinic [facility] clinic to the temporary address in Epic WAM.    Antonietta Barcelona   [facility] Specialty Technician

## 2018-06-11 NOTE — Unmapped (Signed)
I called Mrs. Triplett in response to her multiple Mychart messages.    She is extremely bothered by her dry eye and blurry vision and is requesting plugs be placed to her RUL and LUL tomorrow on an urgent basis - discussed dissolvable vs. permanent plugs and decided on permanent 0.8 mm plugs ou.  Given the coronavirus concerns, she will come for plugs only and no visit.  If this does not result in improvement she will message back and we can plan to see her for a full visit.     She had a nice tone on the phone.    She was supposed to see Dr. Julaine Hua but had to have emergency surgery on her tooth instead.    She explained that she got to [facility] late for her last visit with Dr. Julaine Hua, so the visit was rescheduled.    I let her know that her aggressive tone and her language that she is using in her Mychart messages is inappropriate.  We try to treat her in a professional and kind fashion, and expect the same from her as a patient.  I asked her to please have a nice tone in her future Mychart messages.    I spent at least 15 minutes on the phone discussing her care.  Marilu Favre MD June 11, 2018 4:08 PM

## 2018-06-12 ENCOUNTER — Ambulatory Visit: Admit: 2018-06-12 | Discharge: 2018-06-13 | Payer: MEDICARE

## 2018-06-12 DIAGNOSIS — H16223 Keratoconjunctivitis sicca, not specified as Sjogren's, bilateral: Principal | ICD-10-CM

## 2018-06-12 NOTE — Unmapped (Signed)
Please see phone note from yesterday.    Placed 0.8 mm [employer] vision plugs RUL/LUL today without difficulties, seem to fit well although if placing again the future could consider a slightly larger size LUL.

## 2018-06-18 DIAGNOSIS — G8929 Other chronic pain: Principal | ICD-10-CM

## 2018-06-18 MED ORDER — TRAMADOL 50 MG TABLET
ORAL_TABLET | Freq: Every day | ORAL | 0 refills | 0.00000 days | Status: CP | PRN
Start: 2018-06-18 — End: 2018-07-29

## 2018-06-18 MED FILL — EMGALITY PEN 120 MG/ML SUBCUTANEOUS PEN INJECTOR: SUBCUTANEOUS | 30 days supply | Qty: 1 | Fill #4

## 2018-06-18 MED FILL — EMGALITY PEN 120 MG/ML SUBCUTANEOUS PEN INJECTOR: 30 days supply | Qty: 1 | Fill #4 | Status: AC

## 2018-06-19 NOTE — Unmapped (Signed)
Error - encounter created in error. Patient did not answer phone call x3, and unable to contact patient.    Trixie Dredge, MD  Allergy and Immunology  [facility]

## 2018-06-22 DIAGNOSIS — J329 Chronic sinusitis, unspecified: Principal | ICD-10-CM

## 2018-06-22 MED ORDER — LEVOFLOXACIN 750 MG TABLET
ORAL_TABLET | Freq: Every day | ORAL | 0 refills | 0 days | Status: CP
Start: 2018-06-22 — End: 2018-06-29

## 2018-06-22 NOTE — Unmapped (Deleted)
NEUROLOGY REPORT    [facility]  [facility] INTERNAL MEDICINE [place]  [address] [place] [address]  [phone]    Date: June 22, 2018  Patient Name: Olivia Stout  MRN: [id number]  PCP:  Lolita Lenz  Time: 11:30 AM- patient has not checked in. Will wait for 5 more minutes. If not, will convert to phone call visit. I tried to reach the patient at 11:39 AM. No answer- left a voice mail.   Prior visit with this Neurologist: 05/22/2018     I tried to reach the patient as scheduled for a video visit to help her with Emgality self-administration. Patient had not logged in, so converted to phone call. No answer - left voicemail. Able to do this later this morning until noon or reschedule at patient's convenience.     Dr. Hollie Beach  CC: Lolita Lenz, MD  Dictated using voice-activated software. Despite editing, typographical errors may exist.

## 2018-06-24 ENCOUNTER — Ambulatory Visit: Admit: 2018-06-24 | Discharge: 2018-06-24 | Payer: MEDICARE

## 2018-06-24 DIAGNOSIS — M3219 Other organ or system involvement in systemic lupus erythematosus: Principal | ICD-10-CM

## 2018-06-24 LAB — CBC W/ AUTO DIFF
BASOPHILS ABSOLUTE COUNT: 0.1 10*9/L (ref 0.0–0.1)
BASOPHILS RELATIVE PERCENT: 1.1 %
EOSINOPHILS ABSOLUTE COUNT: 0.1 10*9/L (ref 0.0–0.4)
EOSINOPHILS RELATIVE PERCENT: 2.2 %
HEMATOCRIT: 45 % (ref 36.0–46.0)
HEMATOCRIT: 45 % (ref 36.0–46.0)
HEMOGLOBIN: 15.1 g/dL (ref 12.0–16.0)
LARGE UNSTAINED CELLS: 2 % (ref 0–4)
LYMPHOCYTES ABSOLUTE COUNT: 1.7 10*9/L (ref 1.5–5.0)
LYMPHOCYTES RELATIVE PERCENT: 31.6 %
MEAN CORPUSCULAR HEMOGLOBIN: 31.3 pg (ref 26.0–34.0)
MEAN CORPUSCULAR VOLUME: 93.6 fL (ref 80.0–100.0)
MEAN PLATELET VOLUME: 7.9 fL (ref 7.0–10.0)
MONOCYTES ABSOLUTE COUNT: 0.4 10*9/L (ref 0.2–0.8)
MONOCYTES RELATIVE PERCENT: 7 %
NEUTROPHILS ABSOLUTE COUNT: 3 10*9/L (ref 2.0–7.5)
PLATELET COUNT: 269 10*9/L (ref 150–440)
RED BLOOD CELL COUNT: 4.81 10*12/L (ref 4.00–5.20)
RED CELL DISTRIBUTION WIDTH: 13.4 % (ref 12.0–15.0)
WBC ADJUSTED: 5.4 10*9/L (ref 4.5–11.0)

## 2018-06-24 LAB — CREATININE: EGFR CKD-EPI AA FEMALE: >90 mL/min/1.73m2 (ref >=60–1.00)

## 2018-06-24 MED ORDER — TIZANIDINE 2 MG TABLET
ORAL_TABLET | 1 refills | 0 days | Status: CP
Start: 2018-06-24 — End: ?

## 2018-06-24 NOTE — Unmapped (Signed)
1130 Patient in today for Benlysta infusion. Patient has no s/s of infection. No issues from the last infusion.   1244??Benlysta started ,patient instructed to use call bell /call nurse for any s/s of unsual symptoms during infusion. Patient educated on possible s/s of reaction,such as chestpain ,itching,shortness of breath lightheadedness and any kind of discomfort. Patient verbalized understanding...  1400??Infusion completed and well tolerated.  1415??Benlysta 730.4mg /250??ml??NS infused over 1hr and??21??min.??per protocol . Pt alert and oriented, offered no complaints during infusion. IV flushed with 10ml NS . Patient discharged in stable condition.

## 2018-07-01 NOTE — Unmapped (Signed)
Patient did not answer phone call.    Trixie Dredge, MD  Allergy and Immunology  [facility]

## 2018-07-02 MED ORDER — LEVOCETIRIZINE 5 MG TABLET
ORAL_TABLET | 2 refills | 0 days | Status: CP
Start: 2018-07-02 — End: 2018-07-08

## 2018-07-02 NOTE — Unmapped (Signed)
done

## 2018-07-02 NOTE — Unmapped (Signed)
Refilled Xyzal

## 2018-07-07 NOTE — Unmapped (Signed)
04/14-pt states she does not feel comfortable self injecting the Emgality,I also mentioned a virtual appt as well ,but she did not seem comfortable with that either,we with will not be shipping any medication out at this time,will follow back u-CB

## 2018-07-08 MED ORDER — LEVOCETIRIZINE 5 MG TABLET
ORAL_TABLET | 2 refills | 0 days | Status: CP
Start: 2018-07-08 — End: ?

## 2018-07-08 NOTE — Unmapped (Signed)
Seen 06/24/2018; refilled xyzal

## 2018-07-08 NOTE — Unmapped (Signed)
Patient is requesting refills and has not been seen since 2016.  Please have patient make appt.

## 2018-07-15 NOTE — Unmapped (Signed)
Please let us know if you would like virtual visit to help you with medication administration. Our clinic is open for Virtual Visits.       Stay safe. Stay home.     Regards,    Dr. Hollie Beach  Board Certified: Adult Neurology  [facility] Division of General Medicine and Clinical Epidemiology  [place], [place]      Helpful Contact Information :    ?? If you have a cough, shortness of breath or you are concerned that you may have COVID-19, please call [phone] [facility] Helpline)    ?? Appointments/ Internal Medicine Clinic: [phone]    ?? HIPPA Compliant Facsimile: [phone]  ?? After Hours:  [phone].  A nurse will answer and can help you decide what kind of medication attention you need.       Thank you for choosing [facility].

## 2018-07-15 NOTE — Unmapped (Signed)
Patient had scheduled virtual visit but staff unable to confirm. I called to check on patient- she had sent a message via MyChart yesterday.     Line busy X2.     PLAN:   1. I have offered virtual visit via MyChart message as of yesterday- this has been offered in the past as well.   2. Patient expressed extreme dissatisfaction with her care during yesterdays message (and messages in the past).  3. Continue to minimize exposure to covid, since she is high risk due to medication. Minimize face to face visits for non urgent/emergent problems.

## 2018-07-21 NOTE — Unmapped (Signed)
Called pt regarding COVID screening questions, up to date advance care plan, and new policies in place for [facility]. No answer, left message for patient to call back.

## 2018-07-21 NOTE — Unmapped (Signed)
Pt called back regarding COVID screening questions. Pt denies any recent travels, resp s/s, fever, exposure, or any ED/urgent care visits. Health care decision maker verified and up to date. Pt instructed on the recent no visitor policy in place. Pt also instructed to wear mask from home if accessible. Pt verbalized understanding.

## 2018-07-22 ENCOUNTER — Ambulatory Visit: Admit: 2018-07-22 | Discharge: 2018-07-23 | Payer: MEDICARE

## 2018-07-22 DIAGNOSIS — M3219 Other organ or system involvement in systemic lupus erythematosus: Principal | ICD-10-CM

## 2018-07-22 NOTE — Unmapped (Signed)
1145 Patient in today for Benlysta infusion. Patient has no s/s of infection. No issues from the last infusion.   1237??Benlysta started ,patient instructed to use call bell /call nurse for any s/s of unsual symptoms during infusion. Patient educated on possible s/s of reaction,such as chestpain ,itching,shortness of breath lightheadedness and any kind of discomfort. Patient verbalized understanding...  1357??Infusion completed and well tolerated.  1415??Benlysta 720.8mg /250??ml??NS infused over 1hr and??18??min.??per protocol . Pt alert and oriented, offered no complaints during infusion. IV flushed with 10ml NS . Patient discharged in stable condition.

## 2018-07-24 NOTE — Unmapped (Signed)
30 tabs w/ 1 refills

## 2018-07-28 NOTE — Unmapped (Signed)
Olivia Stout reports being very nervous self administering medication but she can tell a difference without the medication.  She would like for [place] to ship the [facility] and she will try to find someone who will administer the medication to her.    [facility] Clinical Assessment & Refill Coordination Note    Olivia Stout, DOB: [birth year]  Phone: [phone] (home)     All above HIPAA information was verified with patient.     Specialty Medication(s):   Neurology: Emgality     Current Outpatient Medications   Medication Sig Dispense Refill   ??? acetaminophen (TYLENOL) 500 MG tablet Take 500 mg by mouth every six (6) hours as needed for pain.     ??? albuterol (PROVENTIL HFA;VENTOLIN HFA) 90 mcg/actuation inhaler Inhale 2 puffs. As needed     ??? ammonium lactate (AMLACTIN) 12 % cream Apply topically once daily. 400 g 6   ??? ammonium lactate (LAC-HYDRIN) 12 % lotion As needed     ??? bacitracin-polymyxin b (POLYSPORIN) 500-10,000 unit/gram ointment Apply topically in the morning to ear. 14 g 2   ??? belimumab (BENLYSTA) 400 mg SolR Infuse 720 mg into a venous catheter every thirty (30) days.     ??? cholecalciferol, vitamin D3, (VITAMIN D3) 1,000 unit capsule Take 1,000 Units by mouth daily.     ??? clonazePAM (KLONOPIN) 1 MG tablet Take one tablet for sleep as needed at bed time 30 tablet 3   ??? clotrimazole (LOTRIMIN) 1 % cream Apply topically Two (2) times a day. 30 g 0   ??? diclofenac sodium (VOLTAREN) 1 % gel Apply 2 g topically Four (4) times a day. 100 g 0   ??? DULoxetine (CYMBALTA) 60 MG capsule TAKE 1 CAPSULE BY MOUTH EVERY DAY 90 capsule 1   ??? empty container Misc Use as directed 1 each PRN   ??? EPINEPHrine (EPIPEN) 0.3 mg/0.3 mL injection Inject 0.3 mL (0.3 mg total) into the muscle Take as directed. Note:carry at all times 1 each 3   ??? estradiol (ESTRACE) 0.01 % (0.1 mg/gram) vaginal cream Insert into the vagina.     ??? fluticasone propionate (FLONASE) 50 mcg/actuation nasal spray 2 sprays by Each Nare route Two (2) times a day. 16 mL prn   ??? gabapentin (NEURONTIN) 100 MG capsule Take 1 capsule (100 mg total) by mouth every morning. 90 capsule 3   ??? gabapentin (NEURONTIN) 300 MG capsule Take 1 capsule (300 mg total) by mouth nightly. 90 capsule 3   ??? galcanezumab-gnlm (EMGALITY) 120 mg/mL injection Inject 1 ml (120 mg) under the skin every thirty (30) days. 1 mL 11   ??? galcanezumab-gnlm (EMGALITY) 120 mg/mL injection Inject 1 ml (120 mg) under the skin every twenty-eight (28) days. 1 mL 11   ??? hydroCHLOROthiazide (HYDRODIURIL) 25 MG tablet Take 0.5 tablets (12.5 mg total) by mouth daily. TAKE 0.5 TABLETS (12.5 MG TOTAL) BY MOUTH DAILY. 45 each 3   ??? levocetirizine (XYZAL) 5 MG tablet 1 daily prn allergy 30 tablet 2   ??? levothyroxine (SYNTHROID) 50 MCG tablet Take 1 tablet (50 mcg total) by mouth daily. 90 tablet 3   ??? multivitamin (TAB-A-VITE/THERAGRAN) per tablet Take 1 tablet by mouth daily.     ??? naproxen (NAPROSYN) 500 MG tablet Take 1 tablet (500 mg total) by mouth 2 (two) times a day with meals. TAKE 1 TABLET (500 MG TOTAL) BY MOUTH 2 (TWO) TIMES A DAY WITH MEALS. 60 tablet 11   ???  omeprazole (PRILOSEC) 40 MG capsule Take 1 capsule (40 mg total) by mouth daily. 90 capsule 3   ??? pyridoxine, vitamin B6, (B-6) 100 MG tablet Take 100 mg by mouth daily.     ??? SPONIX ARTHRITIS-MUSCLE PAIN 4-10-0.035 % srlo      ??? tiZANidine (ZANAFLEX) 2 MG tablet TAKE 1/2 TABLET BY MOUTH NIGHTLY AS NEEDED. DISCONTINUE AFTER 6-8 WEEKS 30 tablet 1   ??? traMADoL (ULTRAM) 50 mg tablet Take 0.5 tablets (25 mg total) by mouth daily as needed for pain or pain,severe (7-10). 15 tablet 0   ??? traZODone (DESYREL) 50 MG tablet Take 1 tablet (50 mg total) by mouth nightly. 90 tablet 1   ??? triamcinolone (KENALOG) 0.1 % ointment Apply topically in the evening to ear. 15 g 2     No current facility-administered medications for this visit.         Changes to medications: Suvi reports no changes at this time.    Allergies   Allergen Reactions   ??? Amoxicillin Swelling     Sweating and swelling in throat      ??? Doxycycline Rash     Caused blisters in her mouth.   ??? Ketorolac Shortness Of Breath   ??? Sulfa (Sulfonamide Antibiotics) Other (See Comments)     welts in my mouth   ??? Zocor [Simvastatin] Other (See Comments)     Had issues w/ bearing weight and pain in legs. *Cannot have any statins at all.*     ??? Statins-Hmg-Coa Reductase Inhibitors Other (See Comments)     Leg paralysis  Leg paralysis       Changes to allergies: No    SPECIALTY MEDICATION ADHERENCE     Emgality 120 mg: 0 days of medicine on hand      Specialty medication(s) dose(s) confirmed: Regimen is correct and unchanged.     Are there any concerns with adherence? Pt has not had injection for past two months    Adherence counseling provided? Pt will see if she can find someone else to administer the injection.    CLINICAL MANAGEMENT AND INTERVENTION      Clinical Benefit Assessment:    Do you feel the medicine is effective or helping your condition? Yes    Clinical Benefit counseling provided? Progress note from 05/22/18 shows evidence of clinical benefit    Adverse Effects Assessment:    Are you experiencing any side effects? No    Are you experiencing difficulty administering your medicine? 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 Medication administration counseling provided: She is trying to find anothe person to administer the injection to her.    Quality of Life Assessment:    How many days over the past month did your migraines  keep you from your normal activities? For example, brushing your teeth or getting up in the morning. at least 20 days    Have you discussed this with your provider? Not needed because has not been on Emgality for a couple months    Therapy Appropriateness:    Is therapy appropriate? Yes, therapy is appropriate and should be continued    DISEASE/MEDICATION-SPECIFIC INFORMATION      For patients on injectable medications: Patient currently has 0 doses left.  Next injection is scheduled for As soon as she receives this shipment.    PATIENT SPECIFIC NEEDS     ? Does the patient have any physical, cognitive, or cultural barriers? No    ? Is the patient high risk? No     ?  Does the patient require a Care Management Plan? No     ? Does the patient require physician intervention or other additional services (i.e. nutrition, smoking cessation, social work)? No      SHIPPING     Specialty Medication(s) to be Shipped:   Neurology: Emgality    Other medication(s) to be shipped: none     Changes to insurance: No    Delivery Scheduled: Yes, Expected medication delivery date: 07/30/18/.     Medication will be delivered via Same Day Courier to the confirmed home address in [facility].    The patient will receive a drug information handout for each medication shipped and additional FDA Medication Guides as required.  Verified that patient has previously received a Conservation officer, historic buildings.    Breck Coons [facility] Specialty Pharmacist

## 2018-07-29 MED FILL — EMGALITY PEN 120 MG/ML SUBCUTANEOUS PEN INJECTOR: 30 days supply | Qty: 1 | Fill #5 | Status: AC

## 2018-07-29 MED FILL — EMGALITY PEN 120 MG/ML SUBCUTANEOUS PEN INJECTOR: SUBCUTANEOUS | 30 days supply | Qty: 1 | Fill #5

## 2018-07-30 MED ORDER — TRAMADOL 50 MG TABLET
ORAL_TABLET | Freq: Every day | ORAL | 0 refills | 0.00000 days | Status: CP | PRN
Start: 2018-07-30 — End: 2018-09-08

## 2018-08-03 NOTE — Unmapped (Signed)
Spoke to pt. Requests nurse visit injection to be scheduled 08/06/18 prior to Allergy shots appt.     injection w/  emgality- pt states it was delivered to her home 07/29/18.    pt requests ice pack above left knee prior to injection    Nurse visit scheduled 08/06/18 @ 1:30pm

## 2018-08-06 ENCOUNTER — Institutional Professional Consult (permissible substitution): Admit: 2018-08-06 | Discharge: 2018-08-07 | Payer: MEDICARE

## 2018-08-06 DIAGNOSIS — J3089 Other allergic rhinitis: Principal | ICD-10-CM

## 2018-08-06 NOTE — Unmapped (Signed)
Patient presented in clinic for monthly Emgality Injection. Ice pack given to patient to place on injection site before administration. Explained to patient step by step directions of what was going on. Patient did not have any questions. Informed patient to make a nurse visit appointment at front desk for next injection. Patient verbalized understanding.

## 2018-08-08 MED ORDER — DULOXETINE 60 MG CAPSULE,DELAYED RELEASE
ORAL_CAPSULE | 1 refills | 0 days | Status: CP
Start: 2018-08-08 — End: 2018-09-22

## 2018-08-18 NOTE — Unmapped (Signed)
1400 Spoke to patient and is okay to get her infusion tomorrow.

## 2018-08-19 ENCOUNTER — Ambulatory Visit: Admit: 2018-08-19 | Discharge: 2018-08-20 | Payer: MEDICARE

## 2018-08-19 DIAGNOSIS — M3219 Other organ or system involvement in systemic lupus erythematosus: Principal | ICD-10-CM

## 2018-08-19 NOTE — Unmapped (Signed)
[facility] Refill Coordination Note    Specialty Medication(s) to be Shipped:   General Specialty: Emgality 120mg /ml -faxed dr    Other medication(s) to be shipped:       Olivia Stout, DOB: 1950-07-12  Phone: There are no phone numbers on file.      All above HIPAA information was verified with patient.     Completed refill call assessment today to schedule patient's medication shipment from the [facility] [phone]).       Specialty medication(s) and dose(s) confirmed: Regimen is correct and unchanged.   Changes to medications: [name] reports no changes at this time.  Changes to insurance: No  Questions for the pharmacist: No    Confirmed patient received Welcome Packet with first shipment. The patient will receive a drug information handout for each medication shipped and additional FDA Medication Guides as required.       DISEASE/MEDICATION-SPECIFIC INFORMATION        N/A    SPECIALTY MEDICATION ADHERENCE     Medication Adherence    Patient reported X missed doses in the last month:  0  Specialty Medication:  emgality 120mg /ml                Emgality 120 mg/ml: 0 days of medicine on hand       SHIPPING     Shipping address confirmed in Epic.     Delivery Scheduled: Yes, Expected medication delivery date: 06/03.     Medication will be delivered via Same Day Courier to the home address in [employer] [place].    Antonietta Barcelona   [facility] Specialty Technician

## 2018-08-19 NOTE — Unmapped (Signed)
1130 Patient in today for Benlysta infusion. Patient has no s/s of infection. No issues from the last infusion.   1226??Benlysta started ,patient instructed to use call bell /call nurse for any s/s of unsual symptoms during infusion. Patient educated on possible s/s of reaction,such as chestpain ,itching,shortness of breath lightheadedness and any kind of discomfort. Patient verbalized understanding...  1327??Infusion completed and well tolerated.  1345??Benlysta 735.2mg /250??ml??NS infused over 1hr and??18??min.??per protocol . Pt alert and oriented, offered no complaints during infusion. IV flushed with 10ml NS . Patient discharged in stable condition.

## 2018-08-25 ENCOUNTER — Institutional Professional Consult (permissible substitution): Admit: 2018-08-25 | Discharge: 2018-08-26 | Payer: MEDICARE | Attending: Internal Medicine | Primary: Internal Medicine

## 2018-08-25 DIAGNOSIS — J3089 Other allergic rhinitis: Principal | ICD-10-CM

## 2018-08-27 ENCOUNTER — Telehealth: Admit: 2018-08-27 | Discharge: 2018-08-28 | Payer: MEDICARE | Attending: Internal Medicine | Primary: Internal Medicine

## 2018-08-27 DIAGNOSIS — J3089 Other allergic rhinitis: Secondary | ICD-10-CM

## 2018-08-27 DIAGNOSIS — J0191 Acute recurrent sinusitis, unspecified: Principal | ICD-10-CM

## 2018-08-27 MED ORDER — AZELASTINE 137 MCG (0.1 %) NASAL SPRAY AEROSOL
Freq: Two times a day (BID) | NASAL | 11 refills | 0 days | Status: CP
Start: 2018-08-27 — End: ?

## 2018-08-27 NOTE — Unmapped (Addendum)
[facility]  [address]  [address], [address]  [place], [place] [address]        Your provider today was: Dr. Jeannetta Nap (fellow) and Dr. Elisabeth Most (attending)    Thank you for letting us be involved with your medical care.    Contact Information:    Appointments and Referrals [phone] or [phone]   Refills, form requests, non-urgent questions: [phone] or [phone]  Please note that it may take up to 48 hours to return your call.   Emergencies (Nights and Weekends): [phone]  Ask for the Allergy/Immunology Doctor on call       You can also use [email or web address] ([email or web address] to request medication refills, access test results, and send questions to your doctor.      PLAN  1. If the allergen blood panel is negative, we should stop allergy shots as they are not likely to keep helping your symptoms.  2. Use Flonase 2 sprays twice per day. You may try Flonase Sensimist to see if this is less irritating, or may try a different nasal steroid spray.  3. Use azelastine nasal spray 2 sprays once per day, as needed.  4. We will get a sinus CT to get a better sense of what is occurring with you sinuses.  5. You may also use saline nasal/sinus rinses, starting this now will help with your symptoms and will not confound the CT.

## 2018-08-27 NOTE — Unmapped (Addendum)
ALLERGY / IMMUNOLOGY NEW (last seen 2016) PATIENT VISIT  August 27, 2018 11:25 AM    Referring Physician:    Lolita Lenz, MD  [address]  [address] [phone], [address]  [place], [place] [address]    Reason for Referral:  Re-establish care for allergen immunotherapy    ASSESSMENT AND PLAN  Assessment:    Olivia Stout is a 68 y.o. female that was seen in consultation at the request of Dr. Lolita Stout to re-establish care for allergic rhinitis. [name] symptoms seemed to have evolved into a chronic rhinitis, likely mixed given improvement on SCIT. Her episodic facial pain/pressure and congestion could also be due to chronic sinusitis with nonallergic triggers and acute infections vs cluster-type headaches. Plan to obtain sinus CT to evaluate for chronic sinusitis and treat symptomatically. She is unlikely to be receiving additional benefit from SCIT at this time, will get comprehensive environmental and aim to stop immunotherapy if negative.    PLAN  - Use saline sinus rinses as needed.  - Use Flonase 2 sprays twice per day.She may try Flonase Sensimist to see if this is less irritating, or may try a different nasal steroid spray.  - Azelastine nasal spray 2 sprays once per day, as needed  - Sinus CT to evaluate for chronic sinusitis.  - Comprehensive environmental panel    Discharge Medications:    Requested Prescriptions     Signed Prescriptions Disp Refills   ??? azelastine (ASTELIN) 137 mcg (0.1 %) nasal spray 30 mL 11     Sig: 2 sprays by Each Nare route Two (2) times a day.       Follow-up:  Return in about 6 months (around 02/26/2019).    I spent 35 minutes on the audio/video with the patient. I spent an additional 10 minutes on pre- and post-visit activities.     The patient was physically located in [place] or a state in which I am permitted to provide care. The patient and/or parent/gauardian understood that s/he may incur co-pays and cost sharing, and agreed to the telemedicine visit. The visit was completed via phone and/or video, which was appropriate and reasonable under the circumstances given the patient's presentation at the time.    The patient and/or parent/guardian has been advised of the potential risks and limitations of this mode of treatment (including, but not limited to, the absence of in-person examination) and has agreed to be treated using telemedicine. The patient's/patient's family's questions regarding telemedicine have been answered.     If the phone/video visit was completed in an ambulatory setting, the patient and/or parent/guardian has also been advised to contact their provider???s office for worsening conditions, and seek emergency medical treatment and/or call 911 if the patient deems either necessary.    This patient was seen and discussed with Dr. Elisabeth Stout with whom the plan was jointly formulated.      Olivia Stout L. Thelma Barge, MD  Fellow, [facility] All[facility]Immun[facility]    Subjective:          HISTORY OF PRESENT ILLNESS:  I had the pleasure of seeing Olivia Stout, a 68 y.o. female seen in consultation at the request of Dr. Lolita Stout for further evaluation of allergic rhinitis on allergen immunotherapy. Last seen by Dr. Vickki Stout in 2016.    [name] reports that since was last seen in 2016 she gets sinusitis about twice yearly, and recently contacted Dr. Elisabeth Stout about this. She was treated with levaquin for 7  days and initially felt much improved but symptoms have returned. Her current symptoms are similar to what occurs about twice yearly: bilateral ear pain, sore throat and post nasal drainage, nasal congestion, sinus pressure and tenderness, rhinorrhea, and sneezing. She has noted green mucus from her nose with scant blood but is unable to expectorate post nasal drainage. This typically occurs in the Spring. She has also recently noted eye swelling and increased sweating especially at night. In addition to these biyearly exacerbations, also notes that strong smells trigger facial pressure, congestion and sneezing.    She is taking Xyzal 5 mg which helps with congstion. She uses Flonase 2 sprays as needed but none recently as it irritates her nose. She feels symptoms are worsse  She has not used albuterol in over 1 year and denies having asthma.    Past Medical / Surgical History  Past Medical History:   Diagnosis Date   ??? Allergy desensitization therapy    ??? Anxiety    ??? Arthritis    ??? Back pain, chronic 2003    pinched nerve   ??? Depression    ??? Disease of thyroid gland    ??? Dry eyes     Had permanent cautery lower canula, tried plugs  and partial but caused waatering.   ??? Dysphagia    ??? Dysplastic nevi    ??? Eye trauma     wood chip with corneal abrasion right eye.  metal right eye removed also with abrasion   ??? Fibromyalgia    ??? Fibromyalgia, primary    ??? GERD (gastroesophageal reflux disease)    ??? High cholesterol    ??? History of gastric ulcer    ??? History of underactive thyroid    ??? Hypertension     watching   ??? IBS (irritable bowel syndrome)    ??? Long term prescription benzodiazepine use 06/29/2017   ??? Lupus (CMS-HCC)    ??? Lymphoma (CMS-HCC)     in remission; s/p chemotherapy - NON HODGKINS   ??? Major depressive disorder    ??? Mixed stress and urge urinary incontinence 02/08/2013   ??? Sjoegren syndrome    ??? Urinary, incontinence, stress female    ??? Vaginal prolapse        Past Surgical History:   Procedure Laterality Date   ??? BACK SURGERY  07/27/2015   ??? ESOPHAGEAL DILATION     ??? FRACTURE SURGERY      left forearm   ??? HYSTERECTOMY     ??? KNEE SURGERY     ??? LAPAROSCOPIC NISSEN FUNDOPLICATION     ??? PR ANAL PRESSURE RECORD Left 12/01/2012    Procedure: ANORECTAL MANOMETRY;  Surgeon: None None;  [facility];  Service: Gastroenterology   ??? PR BREATH HYDROGEN TEST N/A 12/01/2012    Procedure: BREATH HYDROGEN TEST;  Surgeon: None None;  [facility];  Service: Gastroenterology   ??? SKIN BIOPSY     ??? SPINE SURGERY     ??? TAH and bladder surgery partial   ??? TONSILLECTOMY         Medications    Current Outpatient Medications:   ???  acetaminophen (TYLENOL) 500 MG tablet, Take 500 mg by mouth every six (6) hours as needed for pain., Disp: , Rfl:   ???  albuterol (PROVENTIL HFA;VENTOLIN HFA) 90 mcg/actuation inhaler, Inhale 2 puffs. As needed, Disp: , Rfl:   ???  ammonium lactate (AMLACTIN) 12 % cream, Apply topically once daily., Disp:  400 g, Rfl: 6  ???  ammonium lactate (LAC-HYDRIN) 12 % lotion, As needed, Disp: , Rfl:   ???  azelastine (ASTELIN) 137 mcg (0.1 %) nasal spray, 2 sprays by Each Nare route Two (2) times a day., Disp: 30 mL, Rfl: 11  ???  bacitracin-polymyxin b (POLYSPORIN) 500-10,000 unit/gram ointment, Apply topically in the morning to ear., Disp: 14 g, Rfl: 2  ???  belimumab (BENLYSTA) 400 mg SolR, Infuse 720 mg into a venous catheter every thirty (30) days., Disp: , Rfl:   ???  cholecalciferol, vitamin D3, (VITAMIN D3) 1,000 unit capsule, Take 1,000 Units by mouth daily., Disp: , Rfl:   ???  clonazePAM (KLONOPIN) 1 MG tablet, Take one tablet for sleep as needed at bed time, Disp: 30 tablet, Rfl: 3  ???  clotrimazole (LOTRIMIN) 1 % cream, Apply topically Two (2) times a day., Disp: 30 g, Rfl: 0  ???  diclofenac sodium (VOLTAREN) 1 % gel, Apply 2 g topically Four (4) times a day., Disp: 100 g, Rfl: 0  ???  DULoxetine (CYMBALTA) 60 MG capsule, TAKE 1 CAPSULE BY MOUTH EVERY DAY, Disp: 90 capsule, Rfl: 1  ???  empty container Misc, Use as directed, Disp: 1 each, Rfl: PRN  ???  EPINEPHrine (EPIPEN) 0.3 mg/0.3 mL injection, Inject 0.3 mL (0.3 mg total) into the muscle Take as directed. Note:carry at all times, Disp: 1 each, Rfl: 3  ???  estradiol (ESTRACE) 0.01 % (0.1 mg/gram) vaginal cream, Insert into the vagina., Disp: , Rfl:   ???  fluticasone propionate (FLONASE) 50 mcg/actuation nasal spray, 2 sprays by Each Nare route Two (2) times a day., Disp: 16 mL, Rfl: prn  ???  gabapentin (NEURONTIN) 100 MG capsule, Take 1 capsule (100 mg total) by mouth every morning., Disp: 90 capsule, Rfl: 3  ???  gabapentin (NEURONTIN) 300 MG capsule, Take 1 capsule (300 mg total) by mouth nightly., Disp: 90 capsule, Rfl: 3  ???  hydroCHLOROthiazide (HYDRODIURIL) 25 MG tablet, Take 0.5 tablets (12.5 mg total) by mouth daily. TAKE 0.5 TABLETS (12.5 MG TOTAL) BY MOUTH DAILY., Disp: 45 each, Rfl: 3  ???  levocetirizine (XYZAL) 5 MG tablet, 1 daily prn allergy, Disp: 30 tablet, Rfl: 2  ???  levothyroxine (SYNTHROID) 50 MCG tablet, Take 1 tablet (50 mcg total) by mouth daily., Disp: 90 tablet, Rfl: 3  ???  multivitamin (TAB-A-VITE/THERAGRAN) per tablet, Take 1 tablet by mouth daily., Disp: , Rfl:   ???  naproxen (NAPROSYN) 500 MG tablet, Take 1 tablet (500 mg total) by mouth 2 (two) times a day with meals. TAKE 1 TABLET (500 MG TOTAL) BY MOUTH 2 (TWO) TIMES A DAY WITH MEALS., Disp: 60 tablet, Rfl: 11  ???  omeprazole (PRILOSEC) 40 MG capsule, Take 1 capsule (40 mg total) by mouth daily., Disp: 90 capsule, Rfl: 3  ???  pyridoxine, vitamin B6, (B-6) 100 MG tablet, Take 100 mg by mouth daily., Disp: , Rfl:   ???  SPONIX ARTHRITIS-MUSCLE PAIN 4-10-0.035 % srlo, , Disp: , Rfl:   ???  tiZANidine (ZANAFLEX) 2 MG tablet, TAKE 1/2 TABLET BY MOUTH NIGHTLY AS NEEDED. DISCONTINUE AFTER 6-8 WEEKS, Disp: 30 tablet, Rfl: 1  ???  traMADoL (ULTRAM) 50 mg tablet, Take 0.5 tablets (25 mg total) by mouth daily as needed for pain or pain,severe (7-10)., Disp: 15 tablet, Rfl: 0  ???  traZODone (DESYREL) 50 MG tablet, Take 1 tablet (50 mg total) by mouth nightly., Disp: 90 tablet, Rfl: 1  ???  triamcinolone (KENALOG) 0.1 %  ointment, Apply topically in the evening to ear., Disp: 15 g, Rfl: 2    Allergies  Allergies   Allergen Reactions   ??? Amoxicillin Swelling     Sweating and swelling in throat      ??? Doxycycline Rash     Caused blisters in her mouth.   ??? Ketorolac Shortness Of Breath   ??? Sulfa (Sulfonamide Antibiotics) Other (See Comments)     welts in my mouth   ??? Zocor [Simvastatin] Other (See Comments)     Had issues w/ bearing weight and pain in legs. *Cannot have any statins at all.*     ??? Statins-Hmg-Coa Reductase Inhibitors Other (See Comments)     Leg paralysis  Leg paralysis       Family History  Family History   Problem Relation Age of Onset   ??? Diabetes Mother    ??? Crohn's disease Mother    ??? Emphysema Mother    ??? Glaucoma Mother    ??? Asthma Mother    ??? Osteoporosis Mother    ??? Parkinsonism Mother    ??? Stroke Father    ??? Heart disease Father    ??? Cancer Father    ??? Migraines Father    ??? Hypertension Sister    ??? Migraines Sister    ??? Migraines Daughter    ??? No Known Problems Son    ??? No Known Problems Son    ??? ADD / ADHD Neg Hx    ??? Alcohol abuse Neg Hx    ??? Anxiety disorder Neg Hx    ??? Bipolar disorder Neg Hx    ??? Dementia Neg Hx    ??? Depression Neg Hx    ??? Drug abuse Neg Hx    ??? OCD Neg Hx    ??? Paranoid behavior Neg Hx    ??? Physical abuse Neg Hx    ??? Schizophrenia Neg Hx    ??? Sexual abuse Neg Hx    ??? Seizures Neg Hx    ??? Macular degeneration Neg Hx    ??? Blindness Neg Hx    ??? Melanoma Neg Hx    ??? Basal cell carcinoma Neg Hx    ??? Squamous cell carcinoma Neg Hx        Social and environmental history:  Social History     Tobacco Use   ??? Smoking status: Never Smoker   ??? Smokeless tobacco: Never Used   Substance Use Topics   ??? Alcohol use: No     Alcohol/week: 0.0 standard drinks   ??? Drug use: No     Comment: 1970's tried marijuana a few times     Social History     Social History Narrative   ??? Not on file     REVIEW OF SYSTEMS:   The balance of 14 systems was reviewed and found to be normal except as noted in the history of present illness or past medical history.    Objective:            PHYSICAL EXAM:  Largely deferred given video visit.  Sinus tenderness noted.       Results for TOMEIKA, WEINMANN (MRN [id number]) as of 08/28/2018 00:57   Ref. Range 08/09/2014 12:37   Alternaria alternata IgE Latest Ref Range: <0.35 kUA/L <0.35   Aspergillus fumigatus IgE Latest Ref Range: <0.35 kUA/L <0.35   Aspergillus [place] IgE Latest Ref Range: <0.35 kUA/L <0.35 [place] Grass IgE Latest Ref Range: <0.35 kUA/L <0.35  Beech Archivist) tree IgE Latest Ref Range: <0.35 kUA/L <0.35   [place] Grass IgE Latest Ref Range: <0.35 kUA/L <0.35   Birch (Common Silver) tree IgE Latest Ref Range: <0.35 kUA/L <0.35   Box Elder Tree IgE Latest Ref Range: <0.35 kUA/L <0.35   Candida Albicans IgE Latest Ref Range: <0.35 kUA/L <0.35   Cat dander IgE Latest Ref Range: <0.35 kUA/L <0.35   Cladosporium Herbarum IgE Latest Ref Range: <0.35 kUA/L <0.35   Cocklebur IgE Latest Ref Range: <0.35 kUA/L <0.35   Cottonwood (White Poplar) Tree IgE Latest Ref Range: <0.35 kUA/L <0.35   D. farinae IgE Latest Ref Range: <0.35 kUA/L <0.35   D. pteronyssinus IgE Latest Ref Range: <0.35 kUA/L <0.35   Dog Dander IgE Latest Ref Range: <0.35 kUA/L <0.35   Elm Tree IgE Latest Ref Range: <0.35 kUA/L <0.35   English Plantain IgE Latest Ref Range: <0.35 kUA/L <0.35   Epicoccum purpurascens IgE Latest Ref Range: <0.35 kUA/L <0.35   Fusarium Proliferatum IgE Latest Ref Range: <0.35 kUA/L <0.35   [place] Cockroach IgE Latest Ref Range: <0.35 kUA/L <0.35   Giant Ragweed IgE Latest Ref Range: <0.35 kUA/L <0.35   Goosefoot (Lamb's Quarters) IgE Latest Ref Range: <0.35 kUA/L <0.35   [place] Grass IgE Latest Ref Range: <0.35 kUA/L <0.35   Mouse IgE Latest Ref Range: <0.35 kUA/L <0.35   Mugwort IgE Latest Ref Range: <0.35 kUA/L <0.35   Mucor Racemosus IgE Latest Ref Range: <0.35 kUA/L <0.35   P chrysogenum (P notatum) IgE Latest Ref Range: <0.35 kUA/L <0.35   Pecan Hickory IgE Latest Ref Range: <0.35 kUA/L 0.67 (H)   Pigweed, Common IgE Latest Ref Range: <0.35 kUA/L <0.35   Ragweed, short (common) IgE Latest Ref Range: <0.35 kUA/L <0.35   Rhizopus Nigrans Latest Ref Range: <0.35 kUA/L <0.35   Setomelanomma rostrata (H. halodes) IgE Latest Ref Range: <0.35 kUA/L <0.35   Sheep Sorrel Latest Ref Range: <0.35 kUA/L <0.35   Maple Leaf Sycamore Latest Ref Range: <0.35 kUA/L <0.35   Timothy Grass IgE Latest Ref Range: <0.35 kUA/L 0.36 (H)   Trichophyton rubrum IgE Latest Ref Range: <0.35 kUA/L <0.35   Walnut Tree IgE Latest Ref Range: <0.35 kUA/L <0.35   [employer] Tree IgE Latest Ref Range: <0.35 kUA/L <0.35   [employer] IgE Latest Ref Range: <0.35 kUA/L <0.35   Willow tree IgE Latest Ref Range: <0.35 kUA/L <0.35

## 2018-08-30 MED ORDER — CLONAZEPAM 1 MG TABLET
ORAL_TABLET | 3 refills | 0 days | Status: CP
Start: 2018-08-30 — End: ?

## 2018-08-31 NOTE — Unmapped (Signed)
Addended by: Trixie Dredge D on: 08/31/2018 03:20 PM     Modules accepted: Level of Service

## 2018-09-01 NOTE — Unmapped (Signed)
Pt requesting visit for Emgality administration next week. Will have admin assist to make nurse visit.

## 2018-09-02 NOTE — Unmapped (Signed)
Appt scheduled 06/16

## 2018-09-03 ENCOUNTER — Ambulatory Visit: Admit: 2018-09-03 | Discharge: 2018-09-04 | Payer: MEDICARE

## 2018-09-03 DIAGNOSIS — M3501 Sicca syndrome with keratoconjunctivitis: Secondary | ICD-10-CM

## 2018-09-03 DIAGNOSIS — H16223 Keratoconjunctivitis sicca, not specified as Sjogren's, bilateral: Principal | ICD-10-CM

## 2018-09-03 MED ORDER — CEQUA 0.09 % EYE DROPS IN A DROPPERETTE
Freq: Two times a day (BID) | OPHTHALMIC | 3 refills | 0 days | Status: CP
Start: 2018-09-03 — End: 2018-09-23

## 2018-09-03 NOTE — Unmapped (Signed)
This patient had severe dry eyes  And is new to me.      She had to postpone her last  visit due to COVID restrictions.    She had punctum plugs placed 06/12/2018 by Dr Armando Reichert. These have now fallen out in the upper puncta.  Lower puncta were sealed with Cautery in the past. She had eye irritation from Restasis, and Xiidra.    She is treated for Lupus and has Sjogren's    Will perform Punctum Cautery in upper puncta OU

## 2018-09-08 ENCOUNTER — Institutional Professional Consult (permissible substitution): Admit: 2018-09-08 | Discharge: 2018-09-09 | Payer: MEDICARE

## 2018-09-08 MED ORDER — TRAMADOL 50 MG TABLET
ORAL_TABLET | Freq: Every day | ORAL | 0 refills | 0 days | Status: CP | PRN
Start: 2018-09-08 — End: 2018-10-23

## 2018-09-08 NOTE — Unmapped (Signed)
Order provided to give patient emgality 120mg  injection in clinic today

## 2018-09-08 NOTE — Unmapped (Signed)
Patient presented in clinic for monthly Emgality Injection. Ice pack given to patient to place on injection site before administration. Explained to patient step by step instructions of what was taking place. Informed patient that she would need to contact [employer] to schedule a delivery for her Emgality due to this dose was the last dose we had of hers in clinic and once that is scheduled then to give [place] a call to schedule nurse visit for next injection. Patient did not have any further questions. Patient verbalized understanding.

## 2018-09-11 MED ORDER — TOBRAMYCIN 0.3 % EYE OINTMENT
Freq: Three times a day (TID) | OPHTHALMIC | 1 refills | 0 days | Status: CP
Start: 2018-09-11 — End: 2018-09-17

## 2018-09-17 ENCOUNTER — Ambulatory Visit: Admit: 2018-09-17 | Discharge: 2018-09-18 | Payer: MEDICARE

## 2018-09-17 ENCOUNTER — Institutional Professional Consult (permissible substitution): Admit: 2018-09-17 | Discharge: 2018-09-18 | Payer: MEDICARE

## 2018-09-17 DIAGNOSIS — J3089 Other allergic rhinitis: Principal | ICD-10-CM

## 2018-09-17 DIAGNOSIS — M3219 Other organ or system involvement in systemic lupus erythematosus: Principal | ICD-10-CM

## 2018-09-17 MED ORDER — NEOMYCIN 3.5 MG/G-POLYMYXIN B 10,000 UNIT/G-DEXAMETH 0.1 % EYE OINT
Freq: Two times a day (BID) | OPHTHALMIC | 2 refills | 0 days | Status: CP
Start: 2018-09-17 — End: 2018-10-08

## 2018-09-17 MED ORDER — TOBRAMYCIN-DEXAMETHASONE 0.3 %-0.1 % EYE OINTMENT
Freq: Three times a day (TID) | OPHTHALMIC | 0 refills | 0 days | Status: CP
Start: 2018-09-17 — End: 2018-09-17

## 2018-09-17 NOTE — Unmapped (Signed)
1115??Patient in today for Benlysta infusion. Patient has no s/s of infection. No issues from the last infusion.   1156??Benlysta started ,patient instructed to use call bell /call nurse for any s/s of unsual symptoms during infusion. Patient educated on possible s/s of reaction,such as chestpain ,itching,shortness of breath lightheadedness and any kind of discomfort. Patient verbalized understanding...  1320??Infusion completed and well tolerated.  1340??Benlysta 739.2mg /250??ml??NS infused over 1hr and??24??min.??per protocol . Pt alert and oriented, offered no complaints during infusion. IV flushed with 10ml NS . Patient discharged in stable condition.

## 2018-09-18 LAB — COMPREHENSIVE ENVIRONMENTAL PANEL
ALTERNARIA ALTERNATA IGE: <0.35 kU/L (ref <0.35)
ASPERGILLUS FUMIGATUS IGE: <0.35 kU/L (ref <0.35)
ASPERGILLUS NIGER IGE: <0.35 kU/L (ref <0.35)
BAHIA GRASS IGE: <0.35 kU/L (ref <0.35)
BEECH (AMERICAN) TREE IGE: <0.35 kU/L (ref <0.35)
BERMUDA GRASS IGE: <0.35 kU/L (ref <0.35)
BIRCH (COMMON SILVER) TREE IGE: <0.35 kU/L (ref <0.35)
BOX ELDER TREE IGE: <0.35 kU/L (ref <0.35)
CANDIDA ALBICANS IGE: <0.35 kU/L (ref <0.35)
CAT DANDER IGE: <0.35 kU/L (ref <0.35)
CLADOSPORIUM HERBARUM IGE: <0.35 kU/L (ref <0.35)
COTTONWOOD (WHITE POPLAR) TREE IGE: <0.35 kU/L (ref <0.35)
D. FARINAE IGE: <0.35 kU/L (ref <0.35)
D. PTERONYSSINUS IGE: <0.35 kU/L (ref <0.35)
DOG DANDER IGE: <0.35 kU/L (ref <0.35)
EPICOCCUM PURPURASCENS IGE: <0.35 kU/L (ref <0.35)
FUSARIUM PROLIFERATUM IGE: <0.35 kU/L (ref <0.35)
GERMAN COCKROACH IGE: <0.35 kU/L (ref <0.35)
JOHNSON GRASS IGE: <0.35 kU/L (ref <0.35)
MAPLE LEAF SYCAMORE IGE: <0.35 kU/L (ref <0.35)
MUCOR RACEMOSUS IGE: <0.35 kU/L (ref <0.35)
PECAN (HICKORY) IGE: <0.35 kU/L (ref <0.35)
RHIZOPUS NIGRICAN IGE: <0.35 kU/L (ref <0.35)
SETOMELANOMMA ROSTRATA (H. HALODES) IGE: <0.35 kU/L (ref <0.35)
TIMOTHY GRASS IGE: <0.35 kU/L (ref <0.35)
TRICHOPHYTON RUBRUM IGE: <0.35 kU/L (ref <0.35)
WALNUT TREE IGE: <0.35 kU/L (ref <0.35)
WHITE ASH TREE IGE: <0.35 kU/L (ref <0.35)
WILLOW TREE IGE: <0.35 kU/L (ref <0.35)

## 2018-09-18 LAB — SHEEP SORREL IGE: Lab: 0

## 2018-09-21 MED ORDER — GALCANEZUMAB-GNLM 120 MG/ML SUBCUTANEOUS PEN INJECTOR
SUBCUTANEOUS | 11 refills | 30.00000 days | Status: CP
Start: 2018-09-21 — End: 2019-09-21
  Filled 2018-10-01: qty 1, 30d supply, fill #0

## 2018-09-21 NOTE — Unmapped (Signed)
Unable to leave message, vmailbox is full

## 2018-09-21 NOTE — Unmapped (Signed)
[facility] Refill Coordination Note    Specialty Medication(s) to be Shipped:   General Specialty: emgality 120mg /ml    Other medication(s) to be shipped:       Olivia Stout, DOB: Mar 06, 1951  Phone: There are no phone numbers on file.      All above HIPAA information was verified with patient.     Completed refill call assessment today to schedule patient's medication shipment from the [facility] [phone]).       Specialty medication(s) and dose(s) confirmed: Regimen is correct and unchanged.   Changes to medications: Tifany reports no changes at this time.  Changes to insurance: No  Questions for the pharmacist: No    Confirmed patient received Welcome Packet with first shipment. The patient will receive a drug information handout for each medication shipped and additional FDA Medication Guides as required.       DISEASE/MEDICATION-SPECIFIC INFORMATION        N/A    SPECIALTY MEDICATION ADHERENCE     Medication Adherence    Patient reported X missed doses in the last month:  0  Specialty Medication:  emgality 120mg /ml  Patient is on additional specialty medications:  No                Emgality 120 mg/ml: 0 days of medicine on hand       SHIPPING     Shipping address confirmed in Epic.     Delivery Scheduled: Yes, Expected medication delivery date: 07/09.     Medication will be delivered via [facility] clinic to the temporary address in Epic WAM.    Antonietta Barcelona   [facility] Specialty Technician

## 2018-09-22 ENCOUNTER — Telehealth
Admit: 2018-09-22 | Discharge: 2018-09-23 | Payer: MEDICARE | Attending: Psychiatric/Mental Health | Primary: Psychiatric/Mental Health

## 2018-09-22 DIAGNOSIS — F5102 Adjustment insomnia: Secondary | ICD-10-CM

## 2018-09-22 DIAGNOSIS — Z9149 Other personal history of psychological trauma, not elsewhere classified: Secondary | ICD-10-CM

## 2018-09-22 DIAGNOSIS — F3341 Major depressive disorder, recurrent, in partial remission: Principal | ICD-10-CM

## 2018-09-22 DIAGNOSIS — G43109 Migraine with aura, not intractable, without status migrainosus: Secondary | ICD-10-CM

## 2018-09-22 DIAGNOSIS — M797 Fibromyalgia: Secondary | ICD-10-CM

## 2018-09-22 DIAGNOSIS — G894 Chronic pain syndrome: Secondary | ICD-10-CM

## 2018-09-22 MED ORDER — TRAZODONE 50 MG TABLET
ORAL_TABLET | Freq: Every evening | ORAL | 1 refills | 0 days | Status: CP
Start: 2018-09-22 — End: ?

## 2018-09-22 MED ORDER — CYMBALTA 60 MG CAPSULE,DELAYED RELEASE
ORAL_CAPSULE | Freq: Every day | ORAL | 1 refills | 0 days | Status: CP
Start: 2018-09-22 — End: 2018-09-28

## 2018-09-22 NOTE — Unmapped (Unsigned)
[facility]  Psychiatry Telehealth Video Encounter  Established Patient       Encounter Description: This encounter was conducted via live, face-to-face video conference in the setting of State of Emergency due to COVID-19 Pandemic. I spent 30 minutes on the audio/video with the patient. I spent an additional 15 minutes on pre- and post-visit activities.     The patient was physically located in [place] or a state in which I am permitted to provide care. The patient and/or parent/guardian understood that s/he may incur co-pays and cost sharing, and agreed to the telemedicine visit. The visit was reasonable and appropriate under the circumstances given the patient's presentation at the time.    The patient and/or parent/guardian has been advised of the potential risks and limitations of this mode of treatment (including, but not limited to, the absence of in-person examination) and has agreed to be treated using telemedicine. The patient's/patient's family's questions regarding telemedicine have been answered.     If the visit was completed in an ambulatory setting, the patient and/or parent/guardian has also been advised to contact their provider???s office for worsening conditions, and seek emergency medical treatment and/or call 911 if the patient deems either necessary.      Contact Information     Person Visualized: Patient  Outcome: Contacted Patient   Is there someone else in the room?   I have identified myself to the patient and conveyed my credentials  I have explained the capabilities and limitations of video/telemedicine and the patient/proxy and myself both agree that it is appropriate for their current circumstances/symptoms and patient consents to proceeding with video telephone visit.     Olivia Stout  is a female participating in a video encounter..      Time Spent: 30 minutes    Assessment:  Olivia Stout is a 68 y.o. female with a history of Depression, Traumatic Stress, multiple chronic medical conditions, migraine HA,  who is an established patient in [facility] psychiatry outpatient clinics and is participating in telepsychiatry follow-up by video conferencing.    ***    Risk Assessment:  {PSY risk assessment 2020:66705}        While future psychiatric events cannot be accurately predicted, the patient does not currently require acute inpatient psychiatric care and does not currently meet [facility] involuntary commitment criteria.     Problem List & Plan    1. SAFETY:              -- Patient denies passive SI and denies plan or intent.   -- Patient denies self harm and denies plan or intent              -- Patient has been given contact information for this clinic and the [facility] Psychiatry crisis line number. Provided crisis hotline phone numbers on AVS.  Reviewed safety plan with patient.    2. CHART/ DOCS REVIEWED:   -- 09/22/2018:  EPIC Chart Review Performed including current blood work, Medication list, and medical history.  -- Care Everywhere Reviewed  Medication list, and medical history. PCP:       Problem List Items Addressed This Visit     #Dx  -- {#dx:44503}: {#dx2:44504}  -- 04/08/2018: {#data:44505}  -- {Pharmacologic Intervention:44320}  -- {Therapeutic intervention:44520}    #Dx  -- {#dx:44503}: {#dx2:44504}  -- 04/08/2018: {#data:44505}  -- {Pharmacologic Intervention:44320}  -- {Therapeutic intervention:44520}    #Dx  -- {#dx:44503}: {#dx2:44504}  -- 04/08/2018: {#data:44505}  -- {Pharmacologic Intervention:44320}  -- {  Therapeutic intervention:44520}    #Dx  -- {#dx:44503}: {#dx2:44504}  -- 04/08/2018: {#data:44505}  -- {Pharmacologic Intervention:44320}  -- {Therapeutic intervention:44520}    1. NEW/CHANGED MEDICATIONS TODAY:              --  START                           *Risks/benefits reviewed,                *Recommend alarms/reminders on your phone and use of visual cue reminders                             2. LABS. SCREENINGS ORDERED TODAY     3. PSYCHOTHERAPY:      4. FOLLOW-UP/REFERRALS ORDERED TODAY:     {[place] Covid19 helpine is [phone]. This will disappear if not selected.:66707::[place] Covid19 helpline # is [phone]. }    Return to clinic appointment: ***    Barriers to goals identified and addressed. Pertinent handouts were given today and reviewed with the patient as indicated.  The Care Plan and Self-Management goals have been included on the AVS and the AVS has been printed. Any outside resources or referrals needed at this time are noted above. Patient's current medications have been reviewed. Any new medications prescribed have been discussed, and side effects have been addressed. Have assessed the patient's understanding, response, and barriers to adherence to medications. Patient participated in the formulation of this treatment plan and voiced understanding and all questions have been answered to satisfaction.       Psychotherapy:  {psychotherapy add-on documentation:53071}  The patient was seen for a total of *** minutes of which *** minutes was spent in psychotherapy education and counseling regarding diagnosis, treatment and health Promotion.  The psychotherapy was supportive in nature with the goal of continuing remission of the patient's *** symptoms and to help the patient in coping with symptoms of *** and *** or ***.    Casper Harrison, DNP, MS, PMHNP-BC      Revised Medication(s) Post Visit:  Outpatient Encounter Medications as of 09/22/2018   Medication Sig Dispense Refill   ??? acetaminophen (TYLENOL) 500 MG tablet Take 500 mg by mouth every six (6) hours as needed for pain.     ??? albuterol (PROVENTIL HFA;VENTOLIN HFA) 90 mcg/actuation inhaler Inhale 2 puffs. As needed     ??? ammonium lactate (AMLACTIN) 12 % cream Apply topically once daily. 400 g 6   ??? ammonium lactate (LAC-HYDRIN) 12 % lotion As needed     ??? azelastine (ASTELIN) 137 mcg (0.1 %) nasal spray 2 sprays by Each Nare route Two (2) times a day. 30 mL 11   ??? belimumab (BENLYSTA) 400 mg SolR Infuse 720 mg into a venous catheter every thirty (30) days.     ??? cholecalciferol, vitamin D3, (VITAMIN D3) 1,000 unit capsule Take 1,000 Units by mouth daily.     ??? clonazePAM (KLONOPIN) 1 MG tablet Take one tablet by mouth at bedtime as needed for sleep 30 tablet 3   ??? clotrimazole (LOTRIMIN) 1 % cream Apply topically Two (2) times a day. 30 g 0   ??? cycloSPORINE (CEQUA) 0.09 % Dpet Administer 1 drop to both eyes Two (2) times a day. 180 each 3   ??? diclofenac sodium (VOLTAREN) 1 % gel Apply 2 g topically Four (4) times a day. 100 g 0   ???  DULoxetine (CYMBALTA) 60 MG capsule TAKE 1 CAPSULE BY MOUTH EVERY DAY 90 capsule 1   ??? empty container Misc Use as directed 1 each PRN   ??? EPINEPHrine (EPIPEN) 0.3 mg/0.3 mL injection Inject 0.3 mL (0.3 mg total) into the muscle Take as directed. Note:carry at all times 1 each 3   ??? estradiol (ESTRACE) 0.01 % (0.1 mg/gram) vaginal cream Insert into the vagina.     ??? fluticasone propionate (FLONASE) 50 mcg/actuation nasal spray 2 sprays by Each Nare route Two (2) times a day. 16 mL prn   ??? gabapentin (NEURONTIN) 100 MG capsule Take 1 capsule (100 mg total) by mouth every morning. 90 capsule 3   ??? gabapentin (NEURONTIN) 300 MG capsule Take 1 capsule (300 mg total) by mouth nightly. 90 capsule 3   ??? galcanezumab-gnlm (EMGALITY) 120 mg/mL injection Inject 1 ml (120 mg) under the skin every thirty (30) days. 1 mL 11   ??? hydroCHLOROthiazide (HYDRODIURIL) 25 MG tablet Take 0.5 tablets (12.5 mg total) by mouth daily. TAKE 0.5 TABLETS (12.5 MG TOTAL) BY MOUTH DAILY. 45 each 3   ??? levocetirizine (XYZAL) 5 MG tablet 1 daily prn allergy 30 tablet 2   ??? levothyroxine (SYNTHROID) 50 MCG tablet Take 1 tablet (50 mcg total) by mouth daily. 90 tablet 3   ??? multivitamin (TAB-A-VITE/THERAGRAN) per tablet Take 1 tablet by mouth daily.     ??? naproxen (NAPROSYN) 500 MG tablet Take 1 tablet (500 mg total) by mouth 2 (two) times a day with meals. TAKE 1 TABLET (500 MG TOTAL) BY MOUTH 2 (TWO) TIMES A DAY WITH MEALS. 60 tablet 11   ??? neomycin-polymyxin-dexamethamethasone (MAXITROL) 3.5 mg/g-10,000 unit/g-0.1 % Oint Administer 1 application to both eyes every twelve (12) hours for 21 days. 4 g 2   ??? omeprazole (PRILOSEC) 40 MG capsule Take 1 capsule (40 mg total) by mouth daily. 90 capsule 3   ??? omeprazole (PRILOSEC) 40 MG capsule Take 40 mg by mouth daily.     ??? pyridoxine, vitamin B6, (B-6) 100 MG tablet Take 100 mg by mouth daily.     ??? SPONIX ARTHRITIS-MUSCLE PAIN 4-10-0.035 % srlo      ??? tiZANidine (ZANAFLEX) 2 MG tablet TAKE 1/2 TABLET BY MOUTH NIGHTLY AS NEEDED. DISCONTINUE AFTER 6-8 WEEKS 30 tablet 1   ??? traMADoL (ULTRAM) 50 mg tablet Take 0.5 tablets (25 mg total) by mouth daily as needed for pain or pain,severe (7-10). 15 tablet 0   ??? traZODone (DESYREL) 50 MG tablet Take 1 tablet (50 mg total) by mouth nightly. 90 tablet 1   ??? triamcinolone (KENALOG) 0.1 % ointment Apply topically in the evening to ear. 15 g 2     No facility-administered encounter medications on file as of 09/22/2018.              Subjective:     Psychiatric Chief Concern:  Follow-up psychiatric evaluation for ***    Interval History:  Patient interviewed in a private place, accompanied by ***.    PSYCHIATRIC REVIEW OF SYSTEMS:   The patient denies depressed mood, anhedonia, or other neurovegetative symptoms that would indicate depression.  Denies excessive worries, panic attacks, or other symptoms related to anxiety. Denies flashbacks, nightmares, intrusive memories, hypervigilance, or other physical symptoms related to trauma including headaches myalgias or chronic pain.  Denies racing thoughts, grandiosity, sleep disturbance, or other symptoms of mania.  Denies psychosis- no hallucinations, delusions, paranoia, TB/TI/IOR.  The patient denies SI/HI/SIB or safety issues/unsafe behaviors at this  time.      Neuro:  Denies TBI, seizures, speech difficulty, facial droop, numbness or tingling, denies HA, denies Tics, Tremor, denies migraine, no abnormal movements, gait and station wnl.    GI: Denies abdominal pain, distention, Nausea, vomiting or diarrhea, denies constipation, hematochezia, melena, change in frequency or consistency     Skin:  Denies rash, urticaria, bruises, hives denies excessive dryness    Pertinent Negatives: The pt denies: SI/HI/SIB, psychosis, hallucinations, Command AH, mania, hypomania, paranoia, TB/TI/IOR, and marked impulsivity.      Social History: reviewed; pertinents have been documented in the interval history section.    ROS:  As per Interval History and:  The balance of 10 systems reviewed is negative except as per HPI.       Objective:    Mental Status Exam:  Appearance:    {PSY Appearance:23008}   Motor:   {PSY Motor:23010}   Speech/Language:    {PSY Speech/Lang:23011}   Mood:   {PSY Mood:23012}   Affect:   {PSY Affect:23013}   Thought process and Associations:   {PSY Thought Process revised:53234}   Abnormal/psychotic thought content:     {PSY Thought Content:23016}   Perceptual disturbances:     {perceptual dist phone:66751::Does not endorse auditory or visual hallucinations}     Orientation:   {PSY Orientation:23018}   Attention and Concentration:   {PSY Concentration:23020}   Memory:   {PSY Memory:23021}    Fund of knowledge:    {PSY Fund of Knowledge:23022}   Insight:     {PSY Insight:23023}   Judgment:    {PSY Judgment:23024}   Impulse Control:   {PSY Impulse Control:23555}       Medications: {Desc; reviewed/not reviewed:53335}    PE:   All extremity movements appear WNL with normal strength and no abnormalities or asymmetries observed.  Gait and station appear within normal limits.  There are no focal neurological deficits observed.     Psychometrics:  Psych Scale Scores - Adult      Office Visit from 03/04/2018 in [facility] AT [facility]   **PHQ-9: Severity Measure for DEPRESSION Total Score**  13 [Moderate Depression] Collected on 03/04/2018 0000          Casper Harrison, DNP, MS, PMHNP-BC

## 2018-09-23 MED ORDER — CYCLOSPORINE 0.05 % EYE DROPS IN A DROPPERETTE
Freq: Two times a day (BID) | OPHTHALMIC | 5 refills | 0 days | Status: CP
Start: 2018-09-23 — End: 2019-03-22

## 2018-09-23 MED ORDER — HYDROCHLOROTHIAZIDE 25 MG TABLET
ORAL_TABLET | 3 refills | 0 days | Status: CP
Start: 2018-09-23 — End: ?

## 2018-09-30 MED ORDER — CYMBALTA 60 MG CAPSULE,DELAYED RELEASE
ORAL_CAPSULE | Freq: Every day | ORAL | 1 refills | 0.00000 days | Status: CP
Start: 2018-09-30 — End: ?

## 2018-10-01 MED FILL — EMGALITY PEN 120 MG/ML SUBCUTANEOUS PEN INJECTOR: 30 days supply | Qty: 1 | Fill #0 | Status: AC

## 2018-10-01 NOTE — Unmapped (Signed)
Pharmacy noted that they do not have brand Sanzoz Cymbalta currenty in their pharmacy formulary now and asked if I would like to provide the patient with generic venlafaxine. Declined offer and informed patient. She wlll call her other pharmacies and we will fill her prescription where she can find it.

## 2018-10-09 ENCOUNTER — Institutional Professional Consult (permissible substitution): Admit: 2018-10-09 | Discharge: 2018-10-10 | Payer: MEDICARE

## 2018-10-09 NOTE — Unmapped (Signed)
Patient presented in clinic for Emgality Injection. Ice pack given to patient to place on injection site before administration. Explained to patient step by step instructions of what was taking place. Advised patient to schedule a nurse visit for next injection. Patient did not have any further questions. Patient verbalized understanding.

## 2018-10-14 ENCOUNTER — Ambulatory Visit: Admit: 2018-10-14 | Discharge: 2018-10-15 | Payer: MEDICARE

## 2018-10-14 DIAGNOSIS — M3219 Other organ or system involvement in systemic lupus erythematosus: Principal | ICD-10-CM

## 2018-10-14 LAB — C3 COMPLEMENT: Complement C3:MCnc:Pt:Ser/Plas:Qn:: 153

## 2018-10-14 LAB — C4 COMPLEMENT: Complement C4:MCnc:Pt:Ser/Plas:Qn:: 29.6

## 2018-10-14 LAB — CBC W/ AUTO DIFF
BASOPHILS ABSOLUTE COUNT: 0.1 10*9/L (ref 0.0–0.1)
BASOPHILS RELATIVE PERCENT: 1 %
EOSINOPHILS ABSOLUTE COUNT: 0.1 10*9/L (ref 0.0–0.4)
EOSINOPHILS RELATIVE PERCENT: 1.4 %
HEMATOCRIT: 46.6 % — ABNORMAL HIGH (ref 36.0–46.0)
HEMOGLOBIN: 15.4 g/dL (ref 12.0–16.0)
LARGE UNSTAINED CELLS: 2 % (ref 0–4)
LYMPHOCYTES RELATIVE PERCENT: 34.9 %
MEAN CORPUSCULAR HEMOGLOBIN CONC: 33.1 g/dL (ref 31.0–37.0)
MEAN CORPUSCULAR HEMOGLOBIN: 31.8 pg (ref 26.0–34.0)
MEAN CORPUSCULAR VOLUME: 96.1 fL (ref 80.0–100.0)
MEAN PLATELET VOLUME: 7.8 fL (ref 7.0–10.0)
MONOCYTES ABSOLUTE COUNT: 0.5 10*9/L (ref 0.2–0.8)
MONOCYTES RELATIVE PERCENT: 8 %
NEUTROPHILS ABSOLUTE COUNT: 3 10*9/L (ref 2.0–7.5)
NEUTROPHILS RELATIVE PERCENT: 52.5 %
PLATELET COUNT: 256 10*9/L (ref 150–440)
RED CELL DISTRIBUTION WIDTH: 14 % (ref 12.0–15.0)
WBC ADJUSTED: 5.6 10*9/L (ref 4.5–11.0)

## 2018-10-14 LAB — EOSINOPHILS ABSOLUTE COUNT: Lab: 0.1

## 2018-10-14 LAB — EGFR CKD-EPI NON-AA FEMALE: Lab: 73

## 2018-10-14 LAB — CREATININE
EGFR CKD-EPI AA FEMALE: 84 mL/min/{1.73_m2} (ref >=60)
EGFR CKD-EPI NON-AA FEMALE: 73 mL/min/{1.73_m2} (ref >=60)

## 2018-10-14 LAB — BLOOD UREA NITROGEN: Urea nitrogen:MCnc:Pt:Ser/Plas:Qn:: 17

## 2018-10-14 NOTE — Unmapped (Signed)
1130??Patient in today for Benlysta infusion. Patient has no s/s of infection. No issues from the last infusion.   1228 Benlysta started ,patient instructed to use call bell /call nurse for any s/s of unsual symptoms during infusion. Patient educated on possible s/s of reaction,such as chestpain ,itching,shortness of breath lightheadedness and any kind of discomfort. Patient verbalized understanding...  1335??Infusion completed and well tolerated.  1345??Benlysta 730.4 mg/250??ml??NS infused over 1hr and??9??min.??per protocol . Pt alert and oriented, offered no complaints during infusion. IV flushed with 10ml NS . Patient discharged in stable condition.

## 2018-10-19 LAB — DS-DNA TITER: Lab: 1:10 {titer}

## 2018-10-23 MED ORDER — TRAMADOL 50 MG TABLET
ORAL_TABLET | Freq: Every day | ORAL | 0 refills | 30 days | Status: CP | PRN
Start: 2018-10-23 — End: 2018-10-28

## 2018-10-26 NOTE — Unmapped (Signed)
[facility] Refill Coordination Note    Specialty Medication(s) to be Shipped:   General Specialty: emgality    Other medication(s) to be shipped:       Randolm Idol, DOB: 12/31/1950  Phone: There are no phone numbers on file.      All above HIPAA information was verified with patient.     Completed refill call assessment today to schedule patient's medication shipment from the [facility] [phone]).       Specialty medication(s) and dose(s) confirmed: Regimen is correct and unchanged.   Changes to medications: Haruye reports no changes at this time.  Changes to insurance: No  Questions for the pharmacist: No    Confirmed patient received Welcome Packet with first shipment. The patient will receive a drug information handout for each medication shipped and additional FDA Medication Guides as required.       DISEASE/MEDICATION-SPECIFIC INFORMATION        N/A    SPECIALTY MEDICATION ADHERENCE     Medication Adherence    Patient reported X missed doses in the last month: 0  Specialty Medication: Emgality 120mg /ml  Patient is on additional specialty medications: No                Emgality 120 mg/ml: 0 days of medicine on hand       SHIPPING     Shipping address confirmed in Epic.     Delivery Scheduled: Yes, Expected medication delivery date: 08/11.     Medication will be delivered via Clinic Courier - Dr Rulon Eisenmenger -Internal Med Clinic clinic to the temporary address in Epic WAM.    Antonietta Barcelona   [facility] Specialty Technician

## 2018-10-28 ENCOUNTER — Ambulatory Visit: Admit: 2018-10-28 | Discharge: 2018-10-29 | Payer: MEDICARE

## 2018-10-28 DIAGNOSIS — J0191 Acute recurrent sinusitis, unspecified: Principal | ICD-10-CM

## 2018-10-28 DIAGNOSIS — M3219 Other organ or system involvement in systemic lupus erythematosus: Secondary | ICD-10-CM

## 2018-10-28 DIAGNOSIS — M79622 Pain in left upper arm: Secondary | ICD-10-CM

## 2018-10-28 DIAGNOSIS — Z1231 Encounter for screening mammogram for malignant neoplasm of breast: Secondary | ICD-10-CM

## 2018-10-28 MED ORDER — LEVOFLOXACIN 750 MG TABLET
ORAL_TABLET | Freq: Every day | ORAL | 1 refills | 10.00000 days | Status: CP
Start: 2018-10-28 — End: 2018-11-11

## 2018-10-28 NOTE — Unmapped (Signed)
Internal Medicine Clinic Visit    Reason for visit: Follow-up  A/P:    Aspen was seen today for headache and other.    Diagnoses and all orders for this visit:    ??  #1. Acute Sinusitis, with possible immunosuppresion due to SLE  Allergic rhinitis  -Treat with levaquin 750 mg X 10 days, and may refill for additional 10 days if needed, as treated previously in allergy  -Discussed potential complications with her of  disabling and potentially irreversible serious adverse reactions that have occurred together, including tendinitis and tendon rupture, peripheral neuropathy, and CNS effects. And told her to discontinue levofloxacin immediately and avoid the use of fluoroquinolones in patients who experience any of these serious adverse reactions.    #Left axillary and outer breast pain  -no masses palpated, but tenderness noted (not over breast tissue), overdue for mammogram, ordered it today    #HTN, slightly above goal today today  -Could not tolerate metoprolol, so only taking HCTZ 12.5 mg once daily   -Continue to follow  ??  #2. Opthomology  -has FU next week with Dr. Armando Reichert  -Can't read or watch or TV hurts eyes, left is worse, having dry eyes and pain  ??  #3. Migraine   -Continue on Emgality, followed by Dr. Rulon Eisenmenger  ??  #4. Chronic pain  -Mix OTC 4 % lidocaine cream with aspercream at half and half   ??  #5 Ear wax in right ear  -debrox  -Ears irrigated on both sides today but wax was hard, and could not get any out.  Recommended she try debrox and return to try again.    #SLE  -she reports that recent lupus test is positve, and her lupus is more active.  She has FU for beynlista infusions      Return in about 3 months (around 01/28/2019).    __________________________________________________________    HPI:  She thinks she has a sinus infections.  Green drainage from sinuses.  HA in frontal area. Previously saw Dr. Vickki Hearing in allergy years ago, and was given levoquin, and often a long course of treatment.  Previously took other things and it didn't work.  She has HA, and teeth hurt, and she feels her balance is off.  Making her sweat at night, but no fevers. She's had this for 2 months time.  Allergic to PCN, sulfa.  Has been able to tolerate levaquin.     Also notes left axilla area and outer left breast with pain, some pain with movement of left arm.  Has not gotten mammorgram yet due to COVID19  __________________________________________________________    Problem List:  Patient Active Problem List   Diagnosis   ??? Systemic lupus erythematosus (CMS-HCC)   ??? Migraine headache with aura   ??? History of lymphoma   ??? Mitral valve prolapse   ??? Mixed hyperlipidemia   ??? Pain in joint, lower leg   ??? Reflux esophagitis   ??? Sicca syndrome (CMS-HCC)   ??? Urge incontinence   ??? Allergic rhinitis   ??? Food allergy   ??? Hypothyroid   ??? Mixed stress and urge urinary incontinence   ??? Anxiety   ??? Essential hypertension   ??? Chronic low back pain   ??? Visual complaint   ??? Polypharmacy   ??? Vitamin B12 deficiency   ??? Vitamin D deficiency   ??? Tension headache   ??? Left arm pain   ??? Skin lesion of face   ??? Chronic pain   ???  Fibromyalgia   ??? Inflammatory polyarthropathy (CMS-HCC)   ??? Long-term use of high-risk medication   ??? Adjustment disorder with anxious mood   ??? NSAID long-term use   ??? Sjogren's syndrome with keratoconjunctivitis sicca (CMS-HCC)   ??? Chronic migraine   ??? Breast pain   ??? Long term prescription benzodiazepine use   ??? History of recent stressful life event   ??? 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 Hyperopia with astigmatism and presbyopia, bilateral   ??? Age-related nuclear cataract of both eyes   ??? History of psychological trauma, presenting hazards to health   ??? Recurrent major depressive disorder, in partial remission (CMS-HCC)       Medications:  Reviewed in EPIC  __________________________________________________________    Physical Exam:   Vital Signs:  Vitals:    10/28/18 1310   BP: 132/83   Pulse: 80   Temp: 36.4 ??C (97.5 ??F)   SpO2: 97%   Weight: 73 kg (161 lb)   Height: 157.5 cm (5' 2)       Gen: Well appearing, NAD  TM's with wax bilaterally  Nose with bilateral drainage noted  Tender to palpation over maxilla and frontal sinuses  CV: RRR, no murmurs  Chest, no breast masses palpated, no LAD in axilla, no breast discoloration or nipple discharge  Tender to palpation in left side beside breast  Pulm: CTA bilaterally, no crackles or wheezes  Ext: No edema        Medication adherence and barriers to the treatment plan have been addressed. Opportunities to optimize healthy behaviors have been discussed. Patient / caregiver voiced understanding.

## 2018-10-28 NOTE — Unmapped (Signed)
Start Levaquin 750 mg once daily for 10 days, and may refill for additional 10 days if still have symptoms

## 2018-11-02 NOTE — Unmapped (Signed)
Reason for call:     Called Per Dr. Edwena Felty Request on 8/9 No answer Left VM. See if she is willing to come at 10:30 am on 8/19 prior to her infusion that day. I think her infusion is at 11:30 am.

## 2018-11-03 MED FILL — EMGALITY PEN 120 MG/ML SUBCUTANEOUS PEN INJECTOR: 30 days supply | Qty: 1 | Fill #1 | Status: AC

## 2018-11-03 MED FILL — EMGALITY PEN 120 MG/ML SUBCUTANEOUS PEN INJECTOR: SUBCUTANEOUS | 30 days supply | Qty: 1 | Fill #1

## 2018-11-03 NOTE — Unmapped (Signed)
Patient's Emgality was delivered to clinic as of today. Nurse visit scheduled 11/06/18 at 11:15a. Medication is in fridge.

## 2018-11-06 ENCOUNTER — Institutional Professional Consult (permissible substitution): Admit: 2018-11-06 | Discharge: 2018-11-07 | Payer: MEDICARE

## 2018-11-06 DIAGNOSIS — G44209 Tension-type headache, unspecified, not intractable: Principal | ICD-10-CM

## 2018-11-06 NOTE — Unmapped (Signed)
Ice pack given to pt to place on injection site. Emgality 120 mg/ml injection given.  Pt tolerated with no signs of adverse reaction after 20 min

## 2018-11-11 ENCOUNTER — Ambulatory Visit: Admit: 2018-11-11 | Discharge: 2018-11-12 | Payer: MEDICARE

## 2018-11-11 DIAGNOSIS — M3219 Other organ or system involvement in systemic lupus erythematosus: Principal | ICD-10-CM

## 2018-11-11 DIAGNOSIS — M797 Fibromyalgia: Secondary | ICD-10-CM

## 2018-11-11 LAB — C4 COMPLEMENT: Complement C4:MCnc:Pt:Ser/Plas:Qn:: 25.4

## 2018-11-11 LAB — C3 COMPLEMENT: Complement C3:MCnc:Pt:Ser/Plas:Qn:: 146

## 2018-11-11 NOTE — Unmapped (Signed)
Patient Name: Olivia Stout  PCP: ??Lolita Lenz, MD  Source of History: patient and records  Date of Visit: 11/11/18 10:31 AM    Chief Compliant: Follow-up for SLE    PRIOR RHEUMATOLOGIC HISTORY:?? Longstanding history of SLE characterized by +ANA (negative ENA, dsDNA, normal C3/C4), fatigue, arthralgias, oral ulcers, sicca, fibromyalgia and a past history of mantle zone lymphoma status post rituximab therapy in 2010. She was diagnosed and followed by Dr. Terie Purser for SLE and fibromyalgia. Dr. Oneita Hurt started patient on belimumab which has been continued by Drs. Aundria Rud and Broadus John who have been her rheumatologist following Dr. Salvadore Dom departure.  She has been treated with hydroxychloroquine and belimumab, started in 2012, for her SLE.  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 Records from ophthalmology scanned into media confirm this. 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  Testing by quantiferon gold TB tests here last month was negative. 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    Most recently followed by Dr. Broadus John and last seen by her in February 2017. She was evaluated here at [facility] with [facility] in [place] 2018 which was expected to be her last visit here as she had moved to [place] in December 2017 and was establishing rheumatologic care there. Patient has moved back to [facility] and Dr. Broadus John has since left [facility]. Patient met myself for the September 2018 and was adamant about continuing belimumab infusion although no evidence for active SLE.     HPI: Olivia Stout is a 68 y.o. female who is here for follow-up for SLE and fibromyalgia. Last seen by me in October 2019 and more recently by Carlus Pavlov [facility] in March 2020.  Here today for follow-up in person.    Today, patient reports that over the past 3-4 months that has had progressive fatigue and hair loss. She reports increased short term memory loss. She had a sinus infection in March and was not given antibiotics initially. Recently received levofloxacin with improvement. She has been complaining about blurry vision and has been followed by ophthalmology. Had ductal plugs place but these kept falling out so was ducts were cauterized with initial benefit but now still dry.  Using eye drops without much benefit.  She reports diffuse pain in both extremities. She also complains of pain in bilateral PIPs with intermittent swelling. Reports morning stiffness lasting between 30-60 minutes. She continues with Benlysta infusion and continues to endorse benefit particularly for her energy level three days afterwards. She reports fatigue returning a week prior to infusions. She reports feeling more stressed with COVID-19 in the population. We reviewed her labs which recently showed a very low positive dsDNA 1:10 with normal complements.  No new rashes. No ulcers. Recently got back together with her husband. Has been under stress from this as well. Has a therapist that she meets with regularly.     ROS??: Attests to the above, otherwise, review of all other systems is negative.  NO CP, SOB, change in urine.   ??????  Past Medical and Surgical History:  ??  Patient Active Problem List    Diagnosis Date Noted   ??? Recurrent major depressive disorder, in partial remission (CMS-HCC) 03/13/2018   ???  History of psychological trauma, presenting hazards to health 12/10/2017   ??? 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 Hyperopia with astigmatism and presbyopia, bilateral 10/15/2017   ??? Age-related nuclear cataract of both eyes 10/15/2017   ??? History of recent stressful life event 09/14/2017   ??? Long term prescription benzodiazepine use 06/29/2017   ??? Breast pain 05/31/2017   ??? Chronic migraine 04/29/2017   ??? Chronic pain 03/30/2017   ??? Tension headache 01/01/2017   ??? Left arm pain 01/01/2017   ??? Skin lesion of face 01/01/2017   ??? Essential hypertension 11/06/2016   ??? Chronic low back pain 11/06/2016   ??? Visual complaint 11/06/2016   ??? Polypharmacy 11/06/2016   ??? Vitamin B12 deficiency 11/06/2016   ??? Vitamin D deficiency 11/06/2016   ??? Fibromyalgia 07/11/2016   ??? 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 Long-term use of high-risk medication 07/11/2016   ??? NSAID long-term use 07/11/2016   ??? Sjogren's syndrome with keratoconjunctivitis sicca (CMS-HCC) 07/11/2016   ??? Anxiety 06/21/2013   ??? Adjustment disorder with anxious mood 06/21/2013   ??? Mixed stress and urge urinary incontinence 02/08/2013   ??? Allergic rhinitis 07/07/2012   ??? Food allergy 07/07/2012   ??? Hypothyroid 07/07/2012   ??? Systemic lupus erythematosus (CMS-HCC) 07/01/2012   ??? Mixed hyperlipidemia 05/05/2012   ??? Urge incontinence 04/28/2012   ??? Migraine headache with aura 01/24/2012   ??? History of lymphoma 01/24/2012   ??? Sicca syndrome (CMS-HCC) 06/10/2011   ??? Mitral valve prolapse 02/19/2005   ??? Reflux esophagitis 02/19/2005   ??? Pain in joint, lower leg 05/19/2002     Past Surgical History:   Procedure Laterality Date   ??? BACK SURGERY  07/27/2015   ??? ESOPHAGEAL DILATION     ??? FRACTURE SURGERY      left forearm   ??? HYSTERECTOMY     ??? KNEE SURGERY     ??? LAPAROSCOPIC NISSEN FUNDOPLICATION     ??? PR ANAL PRESSURE RECORD Left 12/01/2012    Procedure: ANORECTAL MANOMETRY;  Surgeon: None None;  [facility];  Service: Gastroenterology   ??? PR BREATH HYDROGEN TEST N/A 12/01/2012    Procedure: BREATH HYDROGEN TEST;  Surgeon: None None;  [facility];  Service: Gastroenterology   ??? SKIN BIOPSY     ??? SPINE SURGERY     ??? TAH and bladder surgery      partial   ??? TONSILLECTOMY       Allergies:   ??  Allergies   Allergen Reactions   ??? Amoxicillin Swelling     Sweating and swelling in throat      ??? Doxycycline Rash     Caused blisters in her mouth.   ??? Ketorolac Shortness Of Breath   ??? Sulfa (Sulfonamide Antibiotics) Other (See Comments)     welts in my mouth   ??? Zocor [Simvastatin] Other (See Comments)     Had issues w/ bearing weight and pain in legs. *Cannot have any statins at all.*     ??? Benzalkonium Chloride Other (See Comments)     Red itchy eyes   ??? Statins-Hmg-Coa Reductase Inhibitors Other (See Comments)     Leg paralysis  Leg paralysis     Current Outpatient Medications:  ??  Current Outpatient Medications on File Prior to Visit   Medication Sig Dispense Refill   ??? acetaminophen (TYLENOL) 500 MG tablet Take 500 mg by mouth every six (  6) hours as needed for pain.     ??? albuterol (PROVENTIL HFA;VENTOLIN HFA) 90 mcg/actuation inhaler Inhale 2 puffs. As needed     ??? ammonium lactate (AMLACTIN) 12 % cream Apply topically once daily. 400 g 6   ??? ammonium lactate (LAC-HYDRIN) 12 % lotion As needed     ??? azelastine (ASTELIN) 137 mcg (0.1 %) nasal spray 2 sprays by Each Nare route Two (2) times a day. 30 mL 11   ??? belimumab (BENLYSTA) 400 mg SolR Infuse 720 mg into a venous catheter every thirty (30) days.     ??? cholecalciferol, vitamin D3, (VITAMIN D3) 1,000 unit capsule Take 1,000 Units by mouth daily.     ??? clonazePAM (KLONOPIN) 1 MG tablet Take one tablet by mouth at bedtime as needed for sleep 30 tablet 3   ??? clotrimazole (LOTRIMIN) 1 % cream Apply topically Two (2) times a day. 30 g 0   ??? cycloSPORINE (RESTASIS) 0.05 % ophthalmic emulsion Administer 1 drop to both eyes Two (2) times a day. 60 each 5   ??? CYMBALTA 60 mg capsule Take 1 capsule (60 mg total) by mouth daily. 90 capsule 1   ??? diclofenac sodium (VOLTAREN) 1 % gel Apply 2 g topically Four (4) times a day. 100 g 0   ??? empty container Misc Use as directed 1 each PRN   ??? EPINEPHrine (EPIPEN) 0.3 mg/0.3 mL injection Inject 0.3 mL (0.3 mg total) into the muscle Take as directed. Note:carry at all times 1 each 3   ??? estradiol (ESTRACE) 0.01 % (0.1 mg/gram) vaginal cream Insert into the vagina.     ??? fluticasone propionate (FLONASE) 50 mcg/actuation nasal spray 2 sprays by Each Nare route Two (2) times a day. 16 mL prn   ??? gabapentin (NEURONTIN) 100 MG capsule Take 1 capsule (100 mg total) by mouth every morning. 90 capsule 3   ??? gabapentin (NEURONTIN) 300 MG capsule Take 1 capsule (300 mg total) by mouth nightly. 90 capsule 3   ??? galcanezumab-gnlm (EMGALITY) 120 mg/mL injection Inject the contents of 1 syringe (120 mg) under the skin every thirty (30) days. 1 mL 11   ??? hydroCHLOROthiazide (HYDRODIURIL) 25 MG tablet TAKE 1/2 TABLET BY MOUTH EVERY DAY 46 tablet 3   ??? levocetirizine (XYZAL) 5 MG tablet 1 daily prn allergy 30 tablet 2   ??? [EXPIRED] levoFLOXacin (LEVAQUIN) 750 MG tablet Take 1 tablet (750 mg total) by mouth daily for 10 days. 10 tablet 1   ??? levothyroxine (SYNTHROID) 50 MCG tablet Take 1 tablet (50 mcg total) by mouth daily. 90 tablet 3   ??? multivitamin (TAB-A-VITE/THERAGRAN) per tablet Take 1 tablet by mouth daily.     ??? naproxen (NAPROSYN) 500 MG tablet Take 1 tablet (500 mg total) by mouth 2 (two) times a day with meals. TAKE 1 TABLET (500 MG TOTAL) BY MOUTH 2 (TWO) TIMES A DAY WITH MEALS. 60 tablet 11   ??? omeprazole (PRILOSEC) 40 MG capsule Take 1 capsule (40 mg total) by mouth daily. 90 capsule 3   ??? omeprazole (PRILOSEC) 40 MG capsule Take 40 mg by mouth daily.     ??? pyridoxine, vitamin B6, (B-6) 100 MG tablet Take 100 mg by mouth daily.     ??? SPONIX ARTHRITIS-MUSCLE PAIN 4-10-0.035 % srlo      ??? tiZANidine (ZANAFLEX) 2 MG tablet TAKE 1/2 TABLET BY MOUTH NIGHTLY AS NEEDED. DISCONTINUE AFTER 6-8 WEEKS 30 tablet 1   ??? traZODone (DESYREL) 50 MG tablet  Take 1 tablet (50 mg total) by mouth nightly. 90 tablet 1   ??? triamcinolone (KENALOG) 0.1 % ointment Apply topically in the evening to ear. 15 g 2     Current Facility-Administered Medications on File Prior to Visit   Medication Dose Route Frequency Provider Last Rate Last Dose   ??? galcanezumab-gnlm (EMGALITY) injection 120 mg  120 mg Subcutaneous Once Amy Waynette Buttery, MD         Immunization History   Administered Date(s) Administered   ??? INFLUENZA TIV (TRI) PF (IM) 02/01/2005, 01/07/2008, 12/25/2009, 03/05/2011   ??? Influenza Vaccine Quad (IIV4 PF) 60mo+ injectable 02/11/2012, 12/29/2012, 01/11/2014, 01/01/2017, 12/31/2017   ??? Influenza Virus Vaccine, unspecified formulation 12/24/2014, 12/31/2017   ??? Novel Influenza-h1n1-09, All Formulations 03/10/2008   ??? PNEUMOCOCCAL POLYSACCHARIDE 23 02/19/2011   ??? Pneumococcal Conjugate 13-Valent 03/27/2017   ??? TdaP 02/19/2011     ????PHYSICAL EXAM??  Vital signs: BP 135/75 (BP Site: L Arm, BP Position: Sitting, BP Cuff Size: Medium)  - Pulse 72  - Temp 36.4 ??C (97.5 ??F) (Temporal)  - Ht 157.5 cm (5' 2)  - Wt 74.4 kg (164 lb 1.6 oz)  - BMI 30.01 kg/m?? Body mass index is 30.01 kg/m??.  Gen: Anxious. Pleasant and cooperative. AOx4.?  HEENT: PERRLA, EOMI,conjunctiva moist, wearing mask   Chest: Broad chest excursion with good air movement. CTAB without wheezing/rhonchi/rales. ????  CV: RRR, Normal S1/S2, No murmurs/rubs/gallops heard. ????  Extremities: Warm, 2+ radial and pedal pulses, no C/C/E.????  Neuro: Good comprehension/cognition. CN 2-12 intact.  Muscle strength 5/5 in all extremities. No sensory deficits. Gait normal.????  Comprehensive Musculoskeletal Examination:????  ?? Jaw, neck without limited ROM.????  ?? Shoulders, elbows, wrists, hands, fingers:  No deformity, erythema, warmth, swelling, effusion, limited ROM.??  ?? Lumbosacral spine and hips without limited ROM.????  ?? Knee, ankles, feet, toes: No deformity, erythema, warmth, swelling, effusion, limited ROM. Knee stable to valgus/varus stress and anterior/posterior drawer sign.????  ?? Multiple myofascial tenderness particularly tender over left bicipital tendon and right pes anserine bursa as well as lower lumbar region  Skin: No alopecia, nail change (including no nail pitting), rashes, bruising, petechiae, telangiectasias, tophi, appreciable calcinosis, or nodules.??     LABORATORY  Recent Results (from the past 1680 hour(s))   Comprehensive Environmental Panel    Collection Time: 09/17/18 11:24 AM   Result Value Ref Range    Cocklebur IgE      Cat dander IgE <0.35 <0.35 kUA/L    [place] (White [place]oplar) Tree IgE <0.35 <0.35 kUA/L    Dog Dander IgE <0.35 <0.35 kUA/L    D. farinae IgE <0.35 <0.35 kUA/L    D. pteronyssinus IgE <0.35 <0.35 kUA/L    Bahia Grass IgE <0.35 <0.35 kUA/L    Johnson Grass IgE <0.35 <0.35 kUA/L    Timothy Grass IgE <0.35 <0.35 kUA/L    Alternaria alternata IgE <0.35 <0.35 kUA/L    Candida Albicans IgE <0.35 <0.35 kUA/L    Cladosporium Herbarum IgE <0.35 <0.35 kUA/L    Epicoccum purpurascens IgE <0.35 <0.35 kUA/L    Fusarium Proliferatum IgE <0.35 <0.35 kUA/L    Aspergillus fumigatus IgE <0.35 <0.35 kUA/L    Mucor Racemosus IgE <0.35 <0.35 kUA/L    Aspergillus [place] IgE <0.35 <0.35 kUA/L    Mouse IgE <0.35 <0.35 kUA/L    P chrysogenum (P notatum) IgE <0.35 <0.35 kUA/L    Rhizopus nigrican IgE <0.35 <0.35 kUA/L    Trichophyton rubrum IgE <0.35 <0.35 kUA/L  Setomelanomma rostrata (H. halodes) IgE <0.35 <0.35 kUA/L    Mugwort IgE      Pigweed, Common IgE      English Plantain IgE      Giant Ragweed IgE      German Cockroach IgE <0.35 <0.35 kUA/L    Sheep Sorrel IgE      Ragweed, short (common) IgE      White Ash Tree IgE <0.35 <0.35 kUA/L    Beech (American) tree IgE <0.35 <0.35 kUA/L    Birch (Common Silver) tree IgE <0.35 <0.35 kUA/L    Box Elder Tree IgE <0.35 <0.35 kUA/L    Elm Tree IgE      Maple Leaf Sycamore IgE <0.35 <0.35 kUA/L    White Oak Tree IgE      Pecan Hickory IgE <0.35 <0.35 kUA/L    Walnut Tree IgE <0.35 <0.35 kUA/L    [place] Grass IgE <0.35 <0.35 kUA/L    Goosefoot ([place]) IgE      [place]ow tree IgE <0.35 <0.35 kUA/L   Anti-DSDNA    Collection Time: 10/14/18 11:50 AM   Result Value Ref Range    dsDNA Ab Positive (A) Negative    DS-DNA Titer 1:10    BUN    Collection Time: 10/14/18 11:50 AM   Result Value Ref Range    BUN 17 7 - 21 mg/dL   Creatinine    Collection Time: 10/14/18 11:50 AM   Result Value Ref Range    Creatinine 0.84 0.60 - 1.00 mg/dL    EGFR CKD-EPI Non-African American, Female 73 >=60 mL/min/1.36m2    EGFR CKD-EPI African American, Female 66 >=60 mL/min/1.92m2   C3    Collection Time: 10/14/18 11:50 AM   Result Value Ref Range    C3 Complement 153 88 - 171 mg/dL   C4    Collection Time: 10/14/18 11:50 AM   Result Value Ref Range    C4 Complement 29.6 15.0 - 48.0 mg/dL   CBC w/ Differential    Collection Time: 10/14/18 11:50 AM   Result Value Ref Range    WBC 5.6 4.5 - 11.0 10*9/L    RBC 4.85 4.00 - 5.20 10*12/L    HGB 15.4 12.0 - 16.0 g/dL    HCT 16.1 (H) 09.6 - 46.0 %    MCV 96.1 80.0 - 100.0 fL    MCH 31.8 26.0 - 34.0 pg    MCHC 33.1 31.0 - 37.0 g/dL    RDW 04.5 40.9 - 81.1 %    MPV 7.8 7.0 - 10.0 fL    Platelet 256 150 - 440 10*9/L    Neutrophils % 52.5 %    Lymphocytes % 34.9 %    Monocytes % 8.0 %    Eosinophils % 1.4 %    Basophils % 1.0 %    Absolute Neutrophils 3.0 2.0 - 7.5 10*9/L    Absolute Lymphocytes 2.0 1.5 - 5.0 10*9/L    Absolute Monocytes 0.5 0.2 - 0.8 10*9/L    Absolute Eosinophils 0.1 0.0 - 0.4 10*9/L    Absolute Basophils 0.1 0.0 - 0.1 10*9/L    Large Unstained Cells 2 0 - 4 %    Macrocytosis Slight (A) Not Present   C3    Collection Time: 11/11/18 11:52 AM   Result Value Ref Range    C3 Complement 146 88 - 171 mg/dL   C4    Collection Time: 11/11/18 11:52 AM   Result Value Ref Range    C4  Complement 25.4 15.0 - 48.0 mg/dL       ????GENERAL SUMMARY AND IMPRESSION: ????  ????  In summary, the patient is a 68 y.o. female with history of SLE characterized by +ANA (negative ENA, dsNDA, normal C3/C4), fatigue, arthralgias, oral ulcers, sicca, fibromyalgia and a past history of mantle zone lymphoma status post rituximab therapy in 2010. Patient has been on belimumab monthly infusions since at least 2012.  Hydroxychloroquine discontinued in February 2018 for concerns of retinal toxicity.    Today, patient concerned about increasing SLE activity with recent dsDNA 1:10 titer. Discussed titer very low and complements as well as other labs such as CBC wnl. Exam also reassuring. Discussed exam more consistent for fibromyalgia flare in setting of social isolation stress from COVID-19 as well as familial stress. Reassured patient. Will repeat dsDNA, C3 and C4 levels with belimumab infusion today. Discussed having patient transition to [place] belimumab but she declined.  Advised her on exercise for bicips tendonitis and low back as well as optimizing overall sleep and exercise regimen to address underlying fibromyalgia. She has a therapist that is addressing her mood. Follow-up in 4 months with Carlus Pavlov [facility] and 8 months with myself.     RECOMMENDATIONS: ????  ????   Diagnosis ICD-10-CM Associated Orders   1. Other systemic lupus erythematosus with other organ involvement (CMS-HCC)  M32.19    2. Fibromyalgia  M79.7      There are no Patient Instructions on file for this visit.  The patient indicates understanding of these issues and agrees to the plan as outlined above.  Contact information provided for any concerns or questions in the interim.  ??    Deivi Huckins C. Scarlette Calico, MD, PhD  Assistant Professor of Medicine  Department of Medicine/Division of Rheumatology  [facility]  10:31 AM

## 2018-11-11 NOTE — Unmapped (Addendum)
Consider switching to Benlysta injections.    Patient Education        Biceps Tendinitis: Exercises  Introduction  Here are some examples of exercises for you to try. The exercises may be suggested for a condition or for rehabilitation. Start each exercise slowly. Ease off the exercises if you start to have pain.  You will be told when to start these exercises and which ones will work best for you.  How to do the exercises  Biceps stretch   1. Stand and hold your affected arm out to the side, with your hand at about hip level.  2. Gently bend your wrist back so that your fingers point down toward the floor.  3. You may also do this next to a wall and rest your fingers on the wall.  4. For more of a stretch, bend your head to the opposite side of your affected arm.  5. Hold for 15 to 30 seconds.  6. Repeat 2 to 4 times.    Resisted supination   For this and the following exercises, you will need elastic exercise material, such as surgical tubing or Thera-Band.  1. Sit leaning forward with your legs slightly spread. Then place your forearm on your thigh with your hand and affected wrist in front of your knee.  2. Grasp one end of an exercise band with your palm down, and step on the other end.  3. Keeping your wrist straight, roll your palm outward and away from your thigh for a count of 2, then slowly move your wrist back to the starting position to a count of 5.  4. Repeat 8 to 12 times.    Resisted elbow flexion   1. Using your affected arm, hold one end of an elastic band in your hand.  2. Place the other end of the band under your foot on the same side of your body as your affected arm.  3. Slowly bend your elbow and bring your hand toward your shoulder. Your palm and the underside of your wrist should be facing up as you pull the band toward your shoulder. Count to 2 as you pull up.  4. Relax and slowly return to your starting position. Count to 5 as you return to the start.  5. Repeat 8 to 12 times.    Resisted elbow flexion at shoulder level   1. Tie the ends of an exercise band together to form a loop. Attach one end of the loop to a secure object or shut a door on it to hold it in place. (Or you can have someone hold one end of the loop to provide resistance.) The band should be at shoulder level.  2. Stand facing where you have tied or fastened the band.  3. Start with your affected arm held out straight, holding the band in your hand.  4. Slowly bend your elbow to 90 degrees, bringing your hand toward your forehead. Count to 2 as you pull the band toward your head.  5. Relax and slowly return to your starting position. Count to 5 as you return to the start.  6. Repeat 8 to 12 times.    Follow-up care is a key part of your treatment and safety. Be sure to make and go to all appointments, and call your doctor if you are having problems. It's also a good idea to know your test results and keep a list of the medicines you take.  Where can you learn  more?  Go to [facility] at https://myuncchart.org  [facility] under [employer]. Enter [phone] in the search box to learn more about Biceps Tendinitis: Exercises.  Current as of: May 25, 2018??????????????????????????????Content Version: 12.5  ?? 2006-2020 [employer].   Care instructions adapted under license by [facility]. If you have questions about a medical condition or this instruction, always ask your healthcare professional. [employer], Inc[employer] disclaims any warranty or liability for your use of this information.         Patient Education         Back Pain: Strengthening Your Core (00:29)  Your health professional recommends that you watch this short online health video.  Learn how being active builds a strong core and keeps your back healthy.     How to watch the video    Scan the QR code   OR Visit the website    https://hwi.se/[email or web address]   Current as of: May 25, 2018??????????????????????????????Content Version: 12.5  ?? 2006-2020 [employer]. Care instructions adapted under license by [facility]. If you have questions about a medical condition or this instruction, always ask your healthcare professional. [employer], [employer] disclaims any warranty or liability for your use of this information.

## 2018-11-11 NOTE — Unmapped (Signed)
1150??Patient in today for Benlysta infusion. Patient has no s/s of infection. No issues from the last infusion.   1238 Benlysta started ,patient instructed to use call bell /call nurse for any s/s of unsual symptoms during infusion. Patient educated on possible s/s of reaction,such as chestpain ,itching,shortness of breath lightheadedness and any kind of discomfort. Patient verbalized understanding...  1400??Infusion completed and well tolerated.  1415??Benlysta 744 mg/250??ml??NS infused over 1hr and??9??min.??per protocol . Pt alert and oriented, offered no complaints during infusion. IV flushed with 10ml NS . Patient discharged in stable condition.

## 2018-11-11 NOTE — Unmapped (Signed)
HPI:    Olivia Stout is a 68 yo Caucasian woman with SLE characterized by +ANA, arthralgia, oral ulcers, sicca, fatigue, and fibromyalgia who presents for follow up on SLE.    She last last seen on 05/27/18.  Today, patient reports worsening symptoms including increased hair loss, extreme fatigue, and confusion over the past 3-4 months. She states she usually have some hair loss, but now the hair loss is much more than baseline. Regarding her confusion, she states that sometimes she would plan to do something but then forget what she was about to do. Patient has been on Benlysta infusion monthly since 2012 and reports improvements in her symptoms. She states she would feel tired for the first 3-4 days after the infusion, then she feels more energetic until about a week before the next infusion. She reports a sinus infection back in March 2020 with symptoms of congestion and headache. She was only recently treated with Levaquin in early August with resolution of her symptoms.     Patient endorses continued dry eye symptoms and blurry vision. She recently saw ophthalmology 1.5 months ago and had eye surgery to increase moisture in her eyes, which only worked for a few days. She reports no associated eye inflammation. She will see ophthalmology again in a week to address her dry eyes and blurry vision.    Patient endorses diffuse pain throughout her body, including her back, arms, thighs, and calves. She endorses joint pain in her PIP joints bilaterally without erythema or warmth. She endorses intermittent swelling that she could not get her ring off. Reports stiffness in the morning for 30 minutes to 1 hour.    Patient denies skin rash but reports small bumps on her chest and lower legs that come and go. She denies oral ulcers, shortness of breath, chest pain, hematuria, foamy urine, lower extremity edema. She reports her sleep is not very good.    Patient reports recent stressor of getting back together with her husband. BP 135/75, HR 72    Physical Exam:  Gen: Well-appearing woman sitting in chair comfortably in NAD.  HEENT: No patches of hair loss. Eyes without erythema, injection, or photophobia. Oral cavity without ulcers. Adequate saliva pooling. No cervical lymphadenopathy.  CV: RRR, no murmur/gallops/rubs  Lung: Normal work of breathing. CTAB  MSK: ROM: slightly decreased ROM due to pain in left wrist, external rotation of bilateral hip joints. Intact ROM in shoulders, elbow, right wrist, fingers, knees, ankles.  Tender to palpation in PIPs>MCPs in all ten digits and wrist without swelling, erythema, or warmth. No TTP or swelling in elbow, shoulder, knees, ankles.  Tender to palpation in anterior chest, upper back, left bicep tendon insertion, forearms, left thight.    A/P:  Olivia Stout is a 68 yo Caucasian woman with SLE characterized by +ANA, arthralgia, oral ulcers, sicca, fatigue, and fibromyalgia who presents for follow up on SLE with reported worsening fatigue and hair loss.     SLE:  Patient is currently on Benlysta monthly infusion. She was previously on Plaquenil but had to discontinue due to concern for retinal toxicity. Patient reports symptoms of fatigue and hair loss over the past 3-4 months. Her recent lab 10/14/18 showed positive ds-DNA 1:10 (previously negative) with normal complements. Although an SLE flare could the a possible explanation for her worsening symptoms, she does not have other symptoms such as increased joint swelling, rash, oral ulcers, or physical findings to suggest current disease activity, and her complements were wnl. The fatigue  can be secondary to the fibromyalgia. The increased hair loss is likely from her life stressors such as the pandemic and relationship. To evaluate for possible lupus flare, we will repeat her lupus labs including C3, C4, ds-DNA. In addition, the effect of Benlysta seems to wear off about one week before the next infusion. Discussed with patient regarding switching from monthly infusion to weekly injection to avoid the effect wearing off. Patient is not interested at this visit but may consider in the future.  - Labs: C3/C4, ds-DNA  - Continue Benlysta monthly infusion.   - Follow up with ophthalmology next week for management of dry eye and blurry vision.    Fibromyalgia  Patient's symptom of diffuse tenderness and fatigue with multiple tender points on exam are consistent with her history of fibromyalgia. She is currently on gabapentin and Cymbalta for fibromyalgia. We discuss lifestyle modification for fibromyalgia including good sleep hygiene and physical activities.  - Continue gabapentin 100 mg qAM/300 mg qHS and Cymbalta 60 mg qd  - Discuss sleep hygiene and exercise. Home exercise instruction for back and core provided to patient.    Bicep Tendonitis:  Patient has pain at the left bicep tendon that is very tender to palpation on exam, consistent with bicep tendonitis.  - Home exercise for bicep tendonitis provided to patient.        Olivia Stout, Olivia Stout

## 2018-11-12 LAB — DSDNA ANTIBODY: Lab: NEGATIVE

## 2018-11-19 ENCOUNTER — Ambulatory Visit: Admit: 2018-11-19 | Discharge: 2018-11-20 | Payer: MEDICARE

## 2018-11-19 DIAGNOSIS — H02055 Trichiasis without entropian left lower eyelid: Secondary | ICD-10-CM

## 2018-11-19 DIAGNOSIS — H16223 Keratoconjunctivitis sicca, not specified as Sjogren's, bilateral: Principal | ICD-10-CM

## 2018-11-19 MED ORDER — NEOMYCIN-BACITRACIN-POLY-HC 3.5 MG-400-10,000 UNIT/G-1 % EYE OINTMENT
Freq: Two times a day (BID) | OPHTHALMIC | 1 refills | 0 days | Status: CP
Start: 2018-11-19 — End: 2018-12-10

## 2018-11-19 NOTE — Unmapped (Signed)
68 yo female presents with persistent dry eye s/p cautery to lower puncta and punctum plugs placed 06/12/2018. She has experienced continued irritation with use of restasis     She had punctum plugs placed 06/12/2018 by Dr Armando Reichert. These have now fallen out in the upper puncta.  Lower puncta were sealed with Cautery in the past. She has had eye irritation from Restasis, and Xiidra.  ??  She is treated for Lupus and has Sjogren's  ??I performed  Punctum Cautery in upper puncta OU in June    Both upper puncta are slightly open    MGD is worse-- will restart Bruder Mask    Punctum Plugs 0.2 inserted  Trichiasis  LLL removed  Try another round of topical antibiotics    Cannot use an oral antibiotic due to allergies

## 2018-11-25 NOTE — Unmapped (Signed)
This patient has been disenrolled from the [facility] specialty pharmacy services due to emgality no longer on specialty drug list.    Olivia Stout  [facility] Specialty Pharmacist

## 2018-11-26 MED FILL — EMGALITY PEN 120 MG/ML SUBCUTANEOUS PEN INJECTOR: SUBCUTANEOUS | 30 days supply | Qty: 1 | Fill #2

## 2018-11-26 MED FILL — EMGALITY PEN 120 MG/ML SUBCUTANEOUS PEN INJECTOR: 30 days supply | Qty: 1 | Fill #2 | Status: AC

## 2018-11-27 ENCOUNTER — Institutional Professional Consult (permissible substitution): Admit: 2018-11-27 | Discharge: 2018-11-28 | Payer: MEDICARE

## 2018-11-27 DIAGNOSIS — G43119 Migraine with aura, intractable, without status migrainosus: Secondary | ICD-10-CM

## 2018-11-27 DIAGNOSIS — G44209 Tension-type headache, unspecified, not intractable: Secondary | ICD-10-CM

## 2018-12-01 MED ORDER — TRAMADOL 50 MG TABLET
ORAL_TABLET | Freq: Four times a day (QID) | ORAL | 0 refills | 4.00000 days | Status: CP | PRN
Start: 2018-12-01 — End: ?

## 2018-12-14 ENCOUNTER — Ambulatory Visit: Admit: 2018-12-14 | Discharge: 2018-12-15 | Payer: MEDICARE

## 2018-12-14 DIAGNOSIS — M3219 Other organ or system involvement in systemic lupus erythematosus: Secondary | ICD-10-CM

## 2018-12-14 DIAGNOSIS — J301 Allergic rhinitis due to pollen: Secondary | ICD-10-CM

## 2018-12-14 MED ORDER — LEVOCETIRIZINE 5 MG TABLET
ORAL_TABLET | 0 refills | 0 days | Status: CP
Start: 2018-12-14 — End: ?

## 2018-12-17 ENCOUNTER — Ambulatory Visit: Admit: 2018-12-17 | Discharge: 2018-12-18 | Payer: MEDICARE

## 2018-12-17 DIAGNOSIS — H52203 Unspecified astigmatism, bilateral: Secondary | ICD-10-CM

## 2018-12-17 DIAGNOSIS — R5382 Chronic fatigue, unspecified: Secondary | ICD-10-CM

## 2018-12-17 DIAGNOSIS — M3501 Sicca syndrome with keratoconjunctivitis: Secondary | ICD-10-CM

## 2018-12-17 DIAGNOSIS — H524 Presbyopia: Secondary | ICD-10-CM

## 2018-12-17 DIAGNOSIS — H16223 Keratoconjunctivitis sicca, not specified as Sjogren's, bilateral: Secondary | ICD-10-CM

## 2018-12-17 DIAGNOSIS — M797 Fibromyalgia: Secondary | ICD-10-CM

## 2018-12-17 DIAGNOSIS — M3219 Other organ or system involvement in systemic lupus erythematosus: Secondary | ICD-10-CM

## 2018-12-17 DIAGNOSIS — H04129 Dry eye syndrome of unspecified lacrimal gland: Principal | ICD-10-CM

## 2018-12-17 DIAGNOSIS — H02889 Meibomian gland dysfunction of unspecified eye, unspecified eyelid: Secondary | ICD-10-CM

## 2018-12-17 DIAGNOSIS — H5203 Hypermetropia, bilateral: Secondary | ICD-10-CM

## 2018-12-21 MED FILL — EMGALITY PEN 120 MG/ML SUBCUTANEOUS PEN INJECTOR: SUBCUTANEOUS | 30 days supply | Qty: 1 | Fill #3

## 2018-12-21 MED FILL — EMGALITY PEN 120 MG/ML SUBCUTANEOUS PEN INJECTOR: 30 days supply | Qty: 1 | Fill #3 | Status: AC

## 2018-12-25 ENCOUNTER — Institutional Professional Consult (permissible substitution): Admit: 2018-12-25 | Discharge: 2018-12-26 | Payer: MEDICARE

## 2018-12-25 DIAGNOSIS — G44209 Tension-type headache, unspecified, not intractable: Secondary | ICD-10-CM

## 2018-12-25 DIAGNOSIS — G43119 Migraine with aura, intractable, without status migrainosus: Secondary | ICD-10-CM

## 2018-12-29 DIAGNOSIS — Z79899 Other long term (current) drug therapy: Secondary | ICD-10-CM

## 2018-12-29 DIAGNOSIS — G4700 Insomnia, unspecified: Secondary | ICD-10-CM

## 2018-12-29 DIAGNOSIS — Z9149 Other personal history of psychological trauma, not elsewhere classified: Secondary | ICD-10-CM

## 2018-12-30 DIAGNOSIS — M797 Fibromyalgia: Secondary | ICD-10-CM

## 2018-12-30 DIAGNOSIS — M3219 Other organ or system involvement in systemic lupus erythematosus: Secondary | ICD-10-CM

## 2018-12-30 MED ORDER — NAPROXEN 500 MG TABLET
ORAL_TABLET | Freq: Two times a day (BID) | ORAL | 11 refills | 30 days | Status: CP
Start: 2018-12-30 — End: ?

## 2018-12-30 MED ORDER — CLONAZEPAM 1 MG TABLET
ORAL_TABLET | 3 refills | 0 days | Status: CP
Start: 2018-12-30 — End: ?

## 2019-01-08 MED ORDER — TRAMADOL 50 MG TABLET: 50 mg | tablet | Freq: Four times a day (QID) | 0 refills | 4 days | Status: AC

## 2019-01-13 ENCOUNTER — Ambulatory Visit: Admit: 2019-01-13 | Discharge: 2019-01-14 | Payer: MEDICARE

## 2019-01-13 DIAGNOSIS — M3219 Other organ or system involvement in systemic lupus erythematosus: Principal | ICD-10-CM

## 2019-01-18 MED FILL — EMGALITY PEN 120 MG/ML SUBCUTANEOUS PEN INJECTOR: SUBCUTANEOUS | 30 days supply | Qty: 1 | Fill #4

## 2019-01-18 MED FILL — EMGALITY PEN 120 MG/ML SUBCUTANEOUS PEN INJECTOR: 30 days supply | Qty: 1 | Fill #4 | Status: AC

## 2019-01-25 ENCOUNTER — Institutional Professional Consult (permissible substitution): Admit: 2019-01-25 | Discharge: 2019-01-26 | Payer: MEDICARE

## 2019-01-25 DIAGNOSIS — G44209 Tension-type headache, unspecified, not intractable: Principal | ICD-10-CM

## 2019-01-28 MED ORDER — OMEPRAZOLE 40 MG CAPSULE,DELAYED RELEASE
ORAL_CAPSULE | Freq: Every day | ORAL | 0 refills | 90 days | Status: CP
Start: 2019-01-28 — End: ?

## 2019-02-10 ENCOUNTER — Ambulatory Visit: Admit: 2019-02-10 | Discharge: 2019-02-11 | Payer: MEDICARE

## 2019-02-10 DIAGNOSIS — R5383 Other fatigue: Principal | ICD-10-CM

## 2019-02-10 DIAGNOSIS — E039 Hypothyroidism, unspecified: Principal | ICD-10-CM

## 2019-02-10 DIAGNOSIS — I10 Essential (primary) hypertension: Principal | ICD-10-CM

## 2019-02-10 DIAGNOSIS — M797 Fibromyalgia: Principal | ICD-10-CM

## 2019-02-10 DIAGNOSIS — E785 Hyperlipidemia, unspecified: Principal | ICD-10-CM

## 2019-02-10 DIAGNOSIS — M3219 Other organ or system involvement in systemic lupus erythematosus: Principal | ICD-10-CM

## 2019-02-10 DIAGNOSIS — Z23 Encounter for immunization: Principal | ICD-10-CM

## 2019-02-10 DIAGNOSIS — E663 Overweight: Principal | ICD-10-CM

## 2019-02-10 MED ORDER — GABAPENTIN 300 MG CAPSULE
ORAL_CAPSULE | Freq: Every evening | ORAL | 3 refills | 90 days | Status: CP
Start: 2019-02-10 — End: ?

## 2019-02-10 MED ORDER — OMEPRAZOLE 40 MG CAPSULE,DELAYED RELEASE
ORAL_CAPSULE | Freq: Every day | ORAL | 0 refills | 90 days | Status: CP
Start: 2019-02-10 — End: ?

## 2019-02-10 MED ORDER — LEVOTHYROXINE 50 MCG TABLET
ORAL_TABLET | Freq: Every day | ORAL | 3 refills | 90.00000 days | Status: CP
Start: 2019-02-10 — End: ?

## 2019-02-10 MED ORDER — TRAMADOL 50 MG TABLET
ORAL_TABLET | Freq: Four times a day (QID) | ORAL | 0 refills | 4 days | Status: CP | PRN
Start: 2019-02-10 — End: ?

## 2019-02-10 MED ORDER — HYDROCHLOROTHIAZIDE 25 MG TABLET
ORAL_TABLET | Freq: Every day | ORAL | 3 refills | 90 days | Status: CP
Start: 2019-02-10 — End: ?

## 2019-02-10 MED ORDER — GABAPENTIN 100 MG CAPSULE
ORAL_CAPSULE | Freq: Every morning | ORAL | 3 refills | 90 days | Status: CP
Start: 2019-02-10 — End: ?

## 2019-02-11 ENCOUNTER — Ambulatory Visit: Admit: 2019-02-11 | Discharge: 2019-02-12 | Payer: MEDICARE

## 2019-02-11 DIAGNOSIS — E785 Hyperlipidemia, unspecified: Principal | ICD-10-CM

## 2019-02-11 DIAGNOSIS — M3219 Other organ or system involvement in systemic lupus erythematosus: Principal | ICD-10-CM

## 2019-02-11 DIAGNOSIS — E039 Hypothyroidism, unspecified: Principal | ICD-10-CM

## 2019-02-11 DIAGNOSIS — I10 Essential (primary) hypertension: Principal | ICD-10-CM

## 2019-02-15 MED ORDER — EZETIMIBE 10 MG TABLET
ORAL_TABLET | Freq: Every day | ORAL | 1 refills | 90 days | Status: CP
Start: 2019-02-15 — End: 2020-02-15

## 2019-02-23 DIAGNOSIS — J301 Allergic rhinitis due to pollen: Principal | ICD-10-CM

## 2019-02-23 MED ORDER — LEVOCETIRIZINE 5 MG TABLET
ORAL_TABLET | 0 refills | 0 days | Status: CP
Start: 2019-02-23 — End: ?

## 2019-02-24 DIAGNOSIS — J301 Allergic rhinitis due to pollen: Principal | ICD-10-CM

## 2019-02-24 MED ORDER — LEVOCETIRIZINE 5 MG TABLET
ORAL_TABLET | Freq: Every evening | ORAL | 4 refills | 0.00000 days | Status: CP
Start: 2019-02-24 — End: 2019-02-24

## 2019-02-24 MED ORDER — LEVOCETIRIZINE 5 MG TABLET: tablet | 4 refills | 0 days | Status: AC

## 2019-02-25 ENCOUNTER — Ambulatory Visit: Admit: 2019-02-25 | Discharge: 2019-02-25 | Payer: MEDICARE

## 2019-02-25 ENCOUNTER — Institutional Professional Consult (permissible substitution): Admit: 2019-02-25 | Discharge: 2019-02-25 | Payer: MEDICARE

## 2019-02-25 DIAGNOSIS — G43119 Migraine with aura, intractable, without status migrainosus: Principal | ICD-10-CM

## 2019-02-25 DIAGNOSIS — M3501 Sicca syndrome with keratoconjunctivitis: Principal | ICD-10-CM

## 2019-02-25 DIAGNOSIS — H16223 Keratoconjunctivitis sicca, not specified as Sjogren's, bilateral: Principal | ICD-10-CM

## 2019-02-25 MED FILL — EMGALITY PEN 120 MG/ML SUBCUTANEOUS PEN INJECTOR: 30 days supply | Qty: 1 | Fill #5 | Status: AC

## 2019-02-25 MED FILL — EMGALITY PEN 120 MG/ML SUBCUTANEOUS PEN INJECTOR: SUBCUTANEOUS | 30 days supply | Qty: 1 | Fill #5

## 2019-03-11 ENCOUNTER — Ambulatory Visit: Admit: 2019-03-11 | Discharge: 2019-03-11 | Payer: MEDICARE

## 2019-03-11 DIAGNOSIS — M3219 Other organ or system involvement in systemic lupus erythematosus: Principal | ICD-10-CM

## 2019-03-16 MED ORDER — TRAMADOL 50 MG TABLET
ORAL_TABLET | Freq: Four times a day (QID) | ORAL | 0 refills | 4 days | Status: CP | PRN
Start: 2019-03-16 — End: ?

## 2019-03-17 DIAGNOSIS — Z9149 Other personal history of psychological trauma, not elsewhere classified: Principal | ICD-10-CM

## 2019-03-17 DIAGNOSIS — M797 Fibromyalgia: Principal | ICD-10-CM

## 2019-03-17 DIAGNOSIS — F3341 Major depressive disorder, recurrent, in partial remission: Principal | ICD-10-CM

## 2019-03-17 DIAGNOSIS — G894 Chronic pain syndrome: Principal | ICD-10-CM

## 2019-03-17 MED ORDER — TRAZODONE 50 MG TABLET
ORAL_TABLET | Freq: Every evening | ORAL | 1 refills | 90.00000 days | Status: CP
Start: 2019-03-17 — End: ?

## 2019-03-22 MED FILL — EMGALITY PEN 120 MG/ML SUBCUTANEOUS PEN INJECTOR: 30 days supply | Qty: 1 | Fill #6 | Status: AC

## 2019-03-22 MED FILL — EMGALITY PEN 120 MG/ML SUBCUTANEOUS PEN INJECTOR: SUBCUTANEOUS | 30 days supply | Qty: 1 | Fill #6

## 2019-03-23 ENCOUNTER — Ambulatory Visit: Admit: 2019-03-23 | Discharge: 2019-03-23 | Payer: MEDICARE

## 2019-03-23 ENCOUNTER — Institutional Professional Consult (permissible substitution): Admit: 2019-03-23 | Discharge: 2019-03-23 | Payer: MEDICARE

## 2019-03-23 DIAGNOSIS — G43119 Migraine with aura, intractable, without status migrainosus: Principal | ICD-10-CM

## 2019-03-23 DIAGNOSIS — H04129 Dry eye syndrome of unspecified lacrimal gland: Principal | ICD-10-CM

## 2019-03-23 DIAGNOSIS — H02889 Meibomian gland dysfunction of unspecified eye, unspecified eyelid: Secondary | ICD-10-CM

## 2019-04-01 ENCOUNTER — Ambulatory Visit: Admit: 2019-04-01 | Discharge: 2019-04-02 | Payer: MEDICARE

## 2019-04-16 MED FILL — EMGALITY PEN 120 MG/ML SUBCUTANEOUS PEN INJECTOR: 30 days supply | Qty: 1 | Fill #7 | Status: AC

## 2019-04-16 MED FILL — EMGALITY PEN 120 MG/ML SUBCUTANEOUS PEN INJECTOR: SUBCUTANEOUS | 30 days supply | Qty: 1 | Fill #7

## 2019-04-21 DIAGNOSIS — E785 Hyperlipidemia, unspecified: Principal | ICD-10-CM

## 2019-04-21 MED ORDER — TRAMADOL 50 MG TABLET
ORAL_TABLET | Freq: Four times a day (QID) | ORAL | 0 refills | 4 days | Status: CP | PRN
Start: 2019-04-21 — End: ?

## 2019-04-22 ENCOUNTER — Ambulatory Visit: Admit: 2019-04-22 | Discharge: 2019-04-22 | Payer: MEDICARE

## 2019-04-22 ENCOUNTER — Institutional Professional Consult (permissible substitution): Admit: 2019-04-22 | Discharge: 2019-04-22 | Payer: MEDICARE

## 2019-04-22 DIAGNOSIS — G43119 Migraine with aura, intractable, without status migrainosus: Principal | ICD-10-CM

## 2019-04-22 MED ORDER — TRAMADOL 50 MG TABLET
Freq: Four times a day (QID) | ORAL | 0 refills | 4.00000 days | Status: CP | PRN
Start: 2019-04-22 — End: 2019-04-22

## 2019-04-22 MED ORDER — TRAMADOL 50 MG TABLET: 50 mg | each | Freq: Four times a day (QID) | 0 refills | 4 days | Status: AC

## 2019-04-27 DIAGNOSIS — M792 Neuralgia and neuritis, unspecified: Principal | ICD-10-CM

## 2019-04-28 ENCOUNTER — Ambulatory Visit: Admit: 2019-04-28 | Discharge: 2019-04-29 | Payer: MEDICARE

## 2019-04-28 DIAGNOSIS — M3219 Other organ or system involvement in systemic lupus erythematosus: Principal | ICD-10-CM

## 2019-04-29 DIAGNOSIS — M792 Neuralgia and neuritis, unspecified: Principal | ICD-10-CM

## 2019-04-29 MED ORDER — TIZANIDINE 2 MG TABLET
ORAL_TABLET | Freq: Three times a day (TID) | ORAL | 0 refills | 10 days | Status: CP | PRN
Start: 2019-04-29 — End: ?

## 2019-05-04 DIAGNOSIS — Z79899 Other long term (current) drug therapy: Principal | ICD-10-CM

## 2019-05-04 DIAGNOSIS — G4700 Insomnia, unspecified: Principal | ICD-10-CM

## 2019-05-04 DIAGNOSIS — Z9149 Other personal history of psychological trauma, not elsewhere classified: Principal | ICD-10-CM

## 2019-05-04 MED ORDER — CLONAZEPAM 1 MG TABLET
ORAL_TABLET | 0 refills | 0 days | Status: CP
Start: 2019-05-04 — End: ?

## 2019-05-17 MED FILL — EMGALITY PEN 120 MG/ML SUBCUTANEOUS PEN INJECTOR: 30 days supply | Qty: 1 | Fill #8 | Status: AC

## 2019-05-17 MED FILL — EMGALITY PEN 120 MG/ML SUBCUTANEOUS PEN INJECTOR: SUBCUTANEOUS | 30 days supply | Qty: 1 | Fill #8

## 2019-05-18 DIAGNOSIS — M797 Fibromyalgia: Principal | ICD-10-CM

## 2019-05-18 DIAGNOSIS — F3341 Major depressive disorder, recurrent, in partial remission: Principal | ICD-10-CM

## 2019-05-18 DIAGNOSIS — G894 Chronic pain syndrome: Principal | ICD-10-CM

## 2019-05-18 MED ORDER — DULOXETINE 60 MG CAPSULE,DELAYED RELEASE
ORAL_CAPSULE | 0 refills | 0 days | Status: CP
Start: 2019-05-18 — End: ?

## 2019-05-19 DIAGNOSIS — M797 Fibromyalgia: Principal | ICD-10-CM

## 2019-05-19 DIAGNOSIS — J301 Allergic rhinitis due to pollen: Principal | ICD-10-CM

## 2019-05-20 ENCOUNTER — Institutional Professional Consult (permissible substitution): Admit: 2019-05-20 | Discharge: 2019-05-21 | Payer: MEDICARE

## 2019-05-20 DIAGNOSIS — M797 Fibromyalgia: Principal | ICD-10-CM

## 2019-05-20 DIAGNOSIS — G43119 Migraine with aura, intractable, without status migrainosus: Principal | ICD-10-CM

## 2019-05-20 MED ORDER — LEVOCETIRIZINE 5 MG TABLET
ORAL_TABLET | 4 refills | 0 days | Status: CP
Start: 2019-05-20 — End: ?

## 2019-05-21 MED ORDER — GABAPENTIN 100 MG CAPSULE
ORAL_CAPSULE | Freq: Every morning | ORAL | 3 refills | 90.00000 days | Status: CP
Start: 2019-05-21 — End: ?

## 2019-05-21 MED ORDER — GABAPENTIN 300 MG CAPSULE
ORAL_CAPSULE | Freq: Every evening | ORAL | 3 refills | 90 days | Status: CP
Start: 2019-05-21 — End: ?

## 2019-05-26 ENCOUNTER — Ambulatory Visit: Admit: 2019-05-26 | Discharge: 2019-05-27 | Payer: MEDICARE

## 2019-06-01 MED ORDER — TRAMADOL 50 MG TABLET
ORAL_TABLET | Freq: Four times a day (QID) | ORAL | 0 refills | 4.00000 days | Status: CP | PRN
Start: 2019-06-01 — End: ?

## 2019-06-03 DIAGNOSIS — G43119 Migraine with aura, intractable, without status migrainosus: Principal | ICD-10-CM

## 2019-06-04 DIAGNOSIS — J3089 Other allergic rhinitis: Principal | ICD-10-CM

## 2019-06-17 MED FILL — EMGALITY PEN 120 MG/ML SUBCUTANEOUS PEN INJECTOR: SUBCUTANEOUS | 30 days supply | Qty: 1 | Fill #9

## 2019-06-17 MED FILL — EMGALITY PEN 120 MG/ML SUBCUTANEOUS PEN INJECTOR: 30 days supply | Qty: 1 | Fill #9 | Status: AC

## 2019-06-22 ENCOUNTER — Ambulatory Visit: Admit: 2019-06-22 | Discharge: 2019-06-22 | Payer: MEDICARE

## 2019-06-22 DIAGNOSIS — M3219 Other organ or system involvement in systemic lupus erythematosus: Principal | ICD-10-CM

## 2019-06-22 DIAGNOSIS — Z1239 Encounter for other screening for malignant neoplasm of breast: Principal | ICD-10-CM

## 2019-06-22 DIAGNOSIS — Z1231 Encounter for screening mammogram for malignant neoplasm of breast: Principal | ICD-10-CM

## 2019-06-22 DIAGNOSIS — F419 Anxiety disorder, unspecified: Principal | ICD-10-CM

## 2019-06-22 DIAGNOSIS — I10 Essential (primary) hypertension: Principal | ICD-10-CM

## 2019-06-22 DIAGNOSIS — R5383 Other fatigue: Principal | ICD-10-CM

## 2019-06-22 DIAGNOSIS — M064 Inflammatory polyarthropathy: Principal | ICD-10-CM

## 2019-06-22 DIAGNOSIS — M797 Fibromyalgia: Principal | ICD-10-CM

## 2019-06-22 DIAGNOSIS — Z780 Asymptomatic menopausal state: Principal | ICD-10-CM

## 2019-06-22 DIAGNOSIS — E039 Hypothyroidism, unspecified: Principal | ICD-10-CM

## 2019-06-22 MED ORDER — OMEPRAZOLE 40 MG CAPSULE,DELAYED RELEASE
ORAL_CAPSULE | Freq: Every day | ORAL | 0 refills | 90.00000 days | Status: CP
Start: 2019-06-22 — End: ?

## 2019-06-22 MED ORDER — HYDROCHLOROTHIAZIDE 25 MG TABLET
ORAL_TABLET | Freq: Every day | ORAL | 3 refills | 90.00000 days | Status: CP
Start: 2019-06-22 — End: ?

## 2019-06-22 MED ORDER — LEVOTHYROXINE 50 MCG TABLET
ORAL_TABLET | Freq: Every day | ORAL | 3 refills | 90.00000 days | Status: CP
Start: 2019-06-22 — End: ?

## 2019-06-22 MED ORDER — TRAMADOL 50 MG TABLET
ORAL_TABLET | Freq: Four times a day (QID) | ORAL | 0 refills | 4.00000 days | Status: CP | PRN
Start: 2019-06-22 — End: ?

## 2019-06-22 MED ORDER — GABAPENTIN 100 MG CAPSULE
ORAL_CAPSULE | Freq: Every morning | ORAL | 3 refills | 90.00000 days | Status: CP
Start: 2019-06-22 — End: ?

## 2019-06-22 MED ORDER — GABAPENTIN 300 MG CAPSULE
ORAL_CAPSULE | Freq: Every evening | ORAL | 3 refills | 90 days | Status: CP
Start: 2019-06-22 — End: ?

## 2019-06-23 ENCOUNTER — Ambulatory Visit: Admit: 2019-06-23 | Discharge: 2019-06-24 | Payer: MEDICARE

## 2019-06-29 ENCOUNTER — Ambulatory Visit: Admit: 2019-06-29 | Discharge: 2019-06-30 | Payer: MEDICARE

## 2019-06-29 DIAGNOSIS — T378X5S Adverse effect of other specified systemic anti-infectives and antiparasitics, sequela: Principal | ICD-10-CM

## 2019-06-29 DIAGNOSIS — H35343 Macular cyst, hole, or pseudohole, bilateral: Principal | ICD-10-CM

## 2019-06-29 DIAGNOSIS — M3501 Sicca syndrome with keratoconjunctivitis: Principal | ICD-10-CM

## 2019-07-09 ENCOUNTER — Ambulatory Visit
Admit: 2019-07-09 | Discharge: 2019-07-10 | Payer: MEDICARE | Attending: Cardiovascular Disease | Primary: Cardiovascular Disease

## 2019-07-09 DIAGNOSIS — E785 Hyperlipidemia, unspecified: Principal | ICD-10-CM

## 2019-07-09 MED ORDER — EZETIMIBE 10 MG TABLET
ORAL_TABLET | Freq: Every day | ORAL | 3 refills | 30 days | Status: CP
Start: 2019-07-09 — End: 2020-07-08

## 2019-07-12 DIAGNOSIS — E785 Hyperlipidemia, unspecified: Principal | ICD-10-CM

## 2019-07-12 MED ORDER — EZETIMIBE 10 MG TABLET
ORAL_TABLET | Freq: Every day | ORAL | 3 refills | 90 days | Status: CP
Start: 2019-07-12 — End: 2020-07-11

## 2019-07-12 MED FILL — EMGALITY PEN 120 MG/ML SUBCUTANEOUS PEN INJECTOR: 30 days supply | Qty: 1 | Fill #10 | Status: AC

## 2019-07-12 MED FILL — EMGALITY PEN 120 MG/ML SUBCUTANEOUS PEN INJECTOR: SUBCUTANEOUS | 30 days supply | Qty: 1 | Fill #10

## 2019-07-14 DIAGNOSIS — J309 Allergic rhinitis, unspecified: Principal | ICD-10-CM

## 2019-07-15 ENCOUNTER — Ambulatory Visit: Admit: 2019-07-15 | Discharge: 2019-07-16 | Payer: MEDICARE

## 2019-07-20 ENCOUNTER — Ambulatory Visit: Admit: 2019-07-20 | Discharge: 2019-07-21 | Payer: MEDICARE

## 2019-07-20 DIAGNOSIS — M797 Fibromyalgia: Principal | ICD-10-CM

## 2019-07-20 MED ORDER — GABAPENTIN 100 MG CAPSULE
ORAL_CAPSULE | Freq: Every morning | ORAL | 3 refills | 90.00000 days | Status: CP
Start: 2019-07-20 — End: ?

## 2019-07-21 ENCOUNTER — Ambulatory Visit: Admit: 2019-07-21 | Discharge: 2019-07-21 | Payer: MEDICARE

## 2019-07-21 DIAGNOSIS — M3219 Other organ or system involvement in systemic lupus erythematosus: Principal | ICD-10-CM

## 2019-07-21 DIAGNOSIS — M5416 Radiculopathy, lumbar region: Principal | ICD-10-CM

## 2019-07-28 MED ORDER — TRAMADOL 50 MG TABLET
ORAL_TABLET | Freq: Four times a day (QID) | ORAL | 0 refills | 4.00000 days | Status: CP | PRN
Start: 2019-07-28 — End: ?

## 2019-08-09 MED FILL — EMGALITY PEN 120 MG/ML SUBCUTANEOUS PEN INJECTOR: SUBCUTANEOUS | 30 days supply | Qty: 1 | Fill #11

## 2019-08-09 MED FILL — EMGALITY PEN 120 MG/ML SUBCUTANEOUS PEN INJECTOR: 30 days supply | Qty: 1 | Fill #11 | Status: AC

## 2019-08-12 ENCOUNTER — Ambulatory Visit: Admit: 2019-08-12 | Discharge: 2019-08-13 | Payer: MEDICARE

## 2019-08-12 ENCOUNTER — Institutional Professional Consult (permissible substitution): Admit: 2019-08-12 | Discharge: 2019-08-13 | Payer: MEDICARE

## 2019-08-12 DIAGNOSIS — M5416 Radiculopathy, lumbar region: Principal | ICD-10-CM

## 2019-08-18 ENCOUNTER — Ambulatory Visit: Admit: 2019-08-18 | Discharge: 2019-08-19 | Payer: MEDICARE

## 2019-08-18 DIAGNOSIS — M3219 Other organ or system involvement in systemic lupus erythematosus: Principal | ICD-10-CM

## 2019-08-26 MED ORDER — TRAMADOL 50 MG TABLET
ORAL_TABLET | Freq: Four times a day (QID) | ORAL | 0 refills | 4.00000 days | Status: CP | PRN
Start: 2019-08-26 — End: ?

## 2019-09-01 DIAGNOSIS — G43709 Chronic migraine without aura, not intractable, without status migrainosus: Principal | ICD-10-CM

## 2019-09-01 MED ORDER — EMGALITY PEN 120 MG/ML SUBCUTANEOUS PEN INJECTOR
SUBCUTANEOUS | 11 refills | 30.00000 days
Start: 2019-09-01 — End: 2020-08-31

## 2019-09-03 MED ORDER — EMGALITY PEN 120 MG/ML SUBCUTANEOUS PEN INJECTOR
SUBCUTANEOUS | 11 refills | 30.00000 days | Status: CP
Start: 2019-09-03 — End: 2020-09-02
  Filled 2019-09-06: qty 1, 30d supply, fill #0

## 2019-09-06 MED FILL — EMGALITY PEN 120 MG/ML SUBCUTANEOUS PEN INJECTOR: 30 days supply | Qty: 1 | Fill #0 | Status: AC

## 2019-09-09 ENCOUNTER — Institutional Professional Consult (permissible substitution): Admit: 2019-09-09 | Discharge: 2019-09-10 | Payer: MEDICARE

## 2019-09-09 DIAGNOSIS — G43119 Migraine with aura, intractable, without status migrainosus: Principal | ICD-10-CM

## 2019-09-14 MED ORDER — TRAMADOL 50 MG TABLET
ORAL_TABLET | Freq: Four times a day (QID) | ORAL | 0 refills | 4.00000 days | PRN
Start: 2019-09-14 — End: ?

## 2019-09-14 MED ORDER — OMEPRAZOLE 40 MG CAPSULE,DELAYED RELEASE
ORAL_CAPSULE | Freq: Every day | ORAL | 0 refills | 90 days | Status: CP
Start: 2019-09-14 — End: 2019-09-14

## 2019-09-14 MED ORDER — OMEPRAZOLE 40 MG CAPSULE,DELAYED RELEASE: 40 mg | capsule | Freq: Every day | 0 refills | 90 days | Status: AC

## 2019-09-15 MED ORDER — TRAMADOL 50 MG TABLET
ORAL_TABLET | Freq: Four times a day (QID) | ORAL | 0 refills | 4.00000 days | PRN
Start: 2019-09-15 — End: ?

## 2019-09-16 ENCOUNTER — Ambulatory Visit: Admit: 2019-09-16 | Discharge: 2019-09-17 | Payer: MEDICARE

## 2019-09-17 MED ORDER — TRAMADOL 50 MG TABLET
ORAL_TABLET | Freq: Four times a day (QID) | ORAL | 0 refills | 4.00000 days | Status: CP | PRN
Start: 2019-09-17 — End: ?

## 2019-10-05 MED FILL — EMGALITY PEN 120 MG/ML SUBCUTANEOUS PEN INJECTOR: 30 days supply | Qty: 1 | Fill #1 | Status: AC

## 2019-10-05 MED FILL — EMGALITY PEN 120 MG/ML SUBCUTANEOUS PEN INJECTOR: SUBCUTANEOUS | 30 days supply | Qty: 1 | Fill #1

## 2019-10-07 ENCOUNTER — Institutional Professional Consult (permissible substitution): Admit: 2019-10-07 | Discharge: 2019-10-08 | Payer: MEDICARE

## 2019-10-07 DIAGNOSIS — G44209 Tension-type headache, unspecified, not intractable: Principal | ICD-10-CM

## 2019-10-08 ENCOUNTER — Ambulatory Visit
Admit: 2019-10-08 | Discharge: 2019-10-09 | Payer: MEDICARE | Attending: Cardiovascular Disease | Primary: Cardiovascular Disease

## 2019-10-08 DIAGNOSIS — E785 Hyperlipidemia, unspecified: Principal | ICD-10-CM

## 2019-10-08 MED ORDER — REPATHA SURECLICK 140 MG/ML SUBCUTANEOUS PEN INJECTOR
SUBCUTANEOUS | 4 refills | 0.00000 days | Status: CP
Start: 2019-10-08 — End: 2020-10-07
  Filled 2019-11-01: qty 6, 84d supply, fill #0

## 2019-10-11 NOTE — Unmapped (Signed)
[facility] Specialty Medication Onboarding    Specialty Medication: Repatha Sureclick pens  Prior Authorization: Approved   Financial Assistance: No - copay  <$25  Final Copay/Day Supply: $4 / 84 days    Insurance Restrictions: None     Notes to Pharmacist:     The triage team has completed the benefits investigation and has determined that the patient is able to fill this medication at [facility]. Please contact the patient to complete the onboarding or follow up with the prescribing physician as needed.

## 2019-10-13 NOTE — Unmapped (Unsigned)
**Pt has appointment with clinic for a nurse to administer medication. Courier delivery is set up to [facility]. Mrs Boehlke will pick up doses from them prior to administration appointments**      [facility]   Patient Onboarding/Medication Counseling    Ms.Olivia Stout is a 69 y.o. female with hypercholesterolemia who I am counseling today on initiation of therapy.  I am speaking to the patient.    Was a Nurse, learning disability used for this call? No    Verified patient's date of birth / HIPAA.    Specialty medication(s) to be sent: General Specialty: Repatha      Non-specialty medications/supplies to be sent: Emgality      Medications not needed at this time: n/a         Repatha (evolocumab)    Medication & Administration     Dosage: Inject the contents of 1 pen (140mg ) under the skin every 2 weeks.    The patient declined counseling on medication administration because she has arranged for a nurse at clinic to administer medication.. The information in the declined sections below are for informational purposes only and was not discussed with patient.       Administration: Administer under the skin of the abdomen, thigh or upper arm. Rotate sites with each injection.  ??? Injection instructions - Autoinjector:  o Remove 1 Repatha autoinjector from the refrigerator and let stand at room temperature for at least 30 minutes.  o Check the autoinjector for the following:  - Expiration date  - Absence of any cracks or damage  - The medicine is clear and colorless and does not contain any particles  - The orange cap is present and securely attached  o Choose your injection site and clean with an alcohol wipe. Allow to air dry completely.  o Pull the orange cap straight off and discard  o Pinch the skin (or stretch) with your thumb and fingers creating an area 2 inches wide  o Maintaining the pinch (or stretch) press the pen to your skin at a 90 degree angle. Firmly push the autoinjector down until the skin stops moving and the yellow safety guard is no longer visible.  o Do not touch the gray start button yet  o When you are ready to inject, press the gray start button. You will hear a click that signals the start of the injection  o Continue to press the pen to your skin and lift your thumb  o The injection may take up to 15 seconds. You will know the injection is complete when the medication window turns yellow. You may also hear a second click.  o Remove the pen from your skin and discard the pen in a sharps container.  o If there is blood at the injection site, press a cotton ball or gauze to the site. Do not rub the injection site.    Adherence/Missed dose instructions: Administer a missed dose within 7 days and resume your normal schedule.  If it has been more than 7 days and you inject every 2 weeks, skip the missed dose and resume your normal schedule..     Goals of Therapy     Lower cholesterol, prevention of cardiovascular events in patients with established cardiovascular disease    Side Effects & Monitoring Parameters   ??? Flu-like symptoms  ??? Signs of a common cold  ??? Back pain  ??? Injection site irritation  ??? Nose or throat irritation  The following side effects should be reported to the provider:  ??? Signs of an allergic reaction  ??? Signs of high blood sugar (confusion, drowsiness, increase thirst/hunger/urination, fast breathing, flushing)      Contraindications, Warnings, & Precautions     ??? Latex (the packaging of Repatha may contain natural rubber)    Drug/Food Interactions     ??? Medication list reviewed in Epic. The patient was instructed to inform the care team before taking any new medications or supplements. No drug interactions identified.     Storage, Handling Precautions, & Disposal   ??? Repatha should be stored in the refrigerator. If necessary, Repatha may be kept at room temperature for no more than 30 days.  ??? Place used devices in a sharps container for disposal.      Current Medications (including OTC/herbals), Comorbidities and Allergies     Current Outpatient Medications   Medication Sig Dispense Refill   ??? acetaminophen (TYLENOL) 500 MG tablet Take 500 mg by mouth every six (6) hours as needed for pain.     ??? albuterol (PROVENTIL HFA;VENTOLIN HFA) 90 mcg/actuation inhaler Inhale 2 puffs. As needed     ??? ammonium lactate (AMLACTIN) 12 % cream Apply topically once daily. 400 g 6   ??? ammonium lactate (LAC-HYDRIN) 12 % lotion As needed     ??? azelastine (ASTELIN) 137 mcg (0.1 %) nasal spray 2 sprays by Each Nare route Two (2) times a day. 30 mL 11   ??? belimumab (BENLYSTA) 400 mg SolR Infuse 720 mg into a venous catheter every thirty (30) days.     ??? cholecalciferol, vitamin D3, (VITAMIN D3) 1,000 unit capsule Take 1,000 Units by mouth daily.     ??? clonazePAM (KLONOPIN) 1 MG tablet TAKE 1 TABLET BY MOUTH AT BEDTIME AS NEEDED FOR SLEEP 30 tablet 0   ??? DULoxetine (CYMBALTA) 60 MG capsule TAKE 1 CAPSULE BY MOUTH ONCE DAILY 90 capsule 0   ??? empty container Misc Use as directed 1 each PRN   ??? EPINEPHrine (EPIPEN) 0.3 mg/0.3 mL injection Inject 0.3 mL (0.3 mg total) into the muscle Take as directed. Note:carry at all times 1 each 3   ??? estradiol (ESTRACE) 0.01 % (0.1 mg/gram) vaginal cream Insert into the vagina.     ??? evolocumab (REPATHA SURECLICK) 140 mg/mL PnIj Inject the contents of 1 pen (140 mg) under the skin every fourteen (14) days. 6 mL 4   ??? ezetimibe (ZETIA) 10 mg tablet Take 1 tablet (10 mg total) by mouth daily. (Patient not taking: Reported on 07/21/2019) 90 tablet 3   ??? fluticasone propionate (FLONASE) 50 mcg/actuation nasal spray 2 sprays by Each Nare route Two (2) times a day. 16 mL prn   ??? gabapentin (NEURONTIN) 100 MG capsule Take 2 capsules (200 mg total) by mouth every morning. 180 capsule 3   ??? gabapentin (NEURONTIN) 300 MG capsule Take 1 capsule (300 mg total) by mouth nightly. 90 capsule 3   ??? galcanezumab-gnlm (EMGALITY PEN) 120 mg/mL injection Inject the contents of 1 pen (120 mg) under the skin every thirty (30) days. 1 mL 11   ??? hydroCHLOROthiazide (HYDRODIURIL) 25 MG tablet Take 1 tablet (25 mg total) by mouth daily. 90 tablet 3   ??? levocetirizine (XYZAL) 5 MG tablet TAKE 1 TABLET BY MOUTH EVERY DAY 90 tablet 4   ??? levoFLOXacin (LEVAQUIN) 750 MG tablet Take 750 mg by mouth daily. (Patient not taking: Reported on 10/08/2019)     ??? levothyroxine (SYNTHROID)  50 MCG tablet Take 1 tablet (50 mcg total) by mouth daily. 90 tablet 3   ??? multivitamin (TAB-A-VITE/THERAGRAN) per tablet Take 1 tablet by mouth daily.     ??? naproxen (NAPROSYN) 500 MG tablet Take 1 tablet (500 mg total) by mouth 2 (two) times a day with meals. TAKE 1 TABLET (500 MG TOTAL) BY MOUTH 2 (TWO) TIMES A DAY WITH MEALS. 60 tablet 11   ??? omeprazole (PRILOSEC) 40 MG capsule Take 1 capsule (40 mg total) by mouth daily. 90 capsule 2   ??? tiZANidine (ZANAFLEX) 2 MG tablet Take 1 tablet (2 mg total) by mouth every eight (8) hours as needed (muscle pain) for up to 30 doses. 30 tablet 0   ??? traMADoL (ULTRAM) 50 mg tablet Take 1 tablet (50 mg total) by mouth every six (6) hours as needed for pain. 15 tablet 0   ??? traMADoL (ULTRAM) 50 mg tablet Take 1 tablet (50 mg total) by mouth every six (6) hours as needed for pain. 15 tablet 0   ??? traZODone (DESYREL) 50 MG tablet Take 1 tablet (50 mg total) by mouth nightly. 90 tablet 1   ??? triamcinolone (KENALOG) 0.1 % ointment Apply topically in the evening to ear. 15 g 2     Current Facility-Administered Medications   Medication Dose Route Frequency Provider Last Rate Last Admin   ??? galcanezumab-gnlm (EMGALITY) injection 120 mg  120 mg Subcutaneous Once Delphina Cahill, MD           Allergies   Allergen Reactions   ??? Amoxicillin Swelling     Sweating and swelling in throat      ??? Doxycycline Rash     Caused blisters in her mouth.   ??? Ketorolac Shortness Of Breath   ??? Sulfa (Sulfonamide Antibiotics) Other (See Comments)     welts in my mouth   ??? Zocor [Simvastatin] Other (See Comments)     Had issues w/ bearing weight and pain in legs. *Cannot have any statins at all.*     ??? Benzalkonium Chloride Other (See Comments)     Red itchy eyes   ??? Statins-Hmg-Coa Reductase Inhibitors Other (See Comments)     Leg paralysis  Leg paralysis       Patient Active Problem List   Diagnosis   ??? Systemic lupus erythematosus (CMS-HCC)   ??? Migraine headache with aura   ??? History of lymphoma   ??? Mitral valve prolapse   ??? Mixed hyperlipidemia   ??? Pain in joint, lower leg   ??? Reflux esophagitis   ??? Sicca syndrome (CMS-HCC)   ??? Urge incontinence   ??? Allergic rhinitis   ??? Food allergy   ??? Hypothyroid   ??? Mixed stress and urge urinary incontinence   ??? Anxiety   ??? Essential hypertension   ??? Chronic low back pain   ??? Visual complaint   ??? Polypharmacy   ??? Vitamin B12 deficiency   ??? Vitamin D deficiency   ??? Tension headache   ??? Left arm pain   ??? Skin lesion of face   ??? Chronic pain   ??? Fibromyalgia   ??? Inflammatory polyarthropathy (CMS-HCC)   ??? Long-term use of high-risk medication   ??? Lymphoma (CMS-HCC)   ??? Adjustment disorder with anxious mood   ??? NSAID long-term use   ??? Sjogren's syndrome with keratoconjunctivitis sicca (CMS-HCC)   ??? Chronic migraine   ??? Breast pain   ??? Long term prescription benzodiazepine use   ???  History of recent stressful life event   ??? 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 Hyperopia with astigmatism and presbyopia, bilateral   ??? Age-related nuclear cataract of both eyes   ??? History of psychological trauma, presenting hazards to health   ??? MDD (major depressive disorder), recurrent, in full remission (CMS-HCC)       Reviewed and up to date in Epic.    Appropriateness of Therapy     Is medication and dose appropriate based on diagnosis? Yes    Prescription has been clinically reviewed: Yes    Baseline Quality of Life Assessment      How many days over the past month did your high cholesterol  keep you from your normal activities? For example, brushing your teeth or getting up in the morning. 0    Financial Information     Medication Assistance provided: Prior Authorization    Anticipated copay of $4 reviewed with patient. Verified delivery address.    Delivery Information     Scheduled delivery date: 11/01/19    Expected start date: 11/03/19    Medication will be delivered via Clinic Courier - [facility] clinic to the temporary address in [place].  This shipment will not require a signature.      Explained the services we provide at [facility] and that each month we would call to set up refills.  Stressed importance of returning phone calls so that we could ensure they receive their medications in time each month.  Informed patient that we should be setting up refills 7-10 days prior to when they will run out of medication.  A pharmacist will reach out to perform a clinical assessment periodically.  Informed patient that a welcome packet and a drug information handout will be sent.      Patient verbalized understanding of the above information as well as how to contact the pharmacy at [phone] option 4 with any questions/concerns.  The pharmacy is open Monday through Friday 8:30am-4:30pm.  A pharmacist is available 24/7 via pager to answer any clinical questions they may have.    Patient Specific Needs     - Does the patient have any physical, cognitive, or cultural barriers? No    - Patient prefers to have medications discussed with  Patient     - Is the patient or caregiver able to read and understand education materials at a high school level or above? Yes    - Patient's primary language is  English     - Is the patient high risk? No     - Does the patient require a Care Management Plan? No     - Does the patient require physician intervention or other additional services (i.e. nutrition, smoking cessation, social work)? No      Camillo Flaming  [facility] Specialty Pharmacist Contraindications and appropriate dosing have been assessed.,Yes, pregnant/nursing patient. Contraindications have been assessed.,Yes, patient is taking oral chemotherapy. Appropriateness of therapy has been assessed.,Yes, patient is taking a REMS drug. Medication is dispensed in compliance with REMS program.}     - Does the patient require a Care Management Plan? {Blank single:19197::No,Yes}     - Does the patient require physician intervention or other additional services (i.e. nutrition, smoking cessation, social work)? {Blank single:19197::No,Yes - ***}      Camillo Flaming  [facility] Specialty Pharmacist

## 2019-10-14 ENCOUNTER — Ambulatory Visit: Admit: 2019-10-14 | Discharge: 2019-10-15 | Payer: MEDICARE

## 2019-10-14 DIAGNOSIS — I10 Essential (primary) hypertension: Principal | ICD-10-CM

## 2019-10-14 LAB — BASIC METABOLIC PANEL
ANION GAP: 7 mmol/L (ref 5–14)
BLOOD UREA NITROGEN: 11 mg/dL (ref 9–23)
BUN / CREAT RATIO: 14
CALCIUM: 10.3 mg/dL (ref 8.7–10.4)
CHLORIDE: 103 mmol/L (ref 98–107)
CO2: 25 mmol/L (ref 20.0–31.0)
CREATININE: 0.79 mg/dL
EGFR CKD-EPI NON-AA FEMALE: 78 mL/min/{1.73_m2} (ref >=60)
GLUCOSE RANDOM: 107 mg/dL (ref 70–179)
SODIUM: 135 mmol/L (ref 135–145)

## 2019-10-14 LAB — LIPID PANEL
CHOLESTEROL/HDL RATIO SCREEN: 6.6 — ABNORMAL HIGH (ref 1.0–4.5)
CHOLESTEROL: 311 mg/dL — ABNORMAL HIGH (ref <=200)
HDL CHOLESTEROL: 47 mg/dL (ref 40–60)
NON-HDL CHOLESTEROL: 264 mg/dL — ABNORMAL HIGH (ref 70–130)

## 2019-10-14 LAB — ANION GAP: Anion gap 3:SCnc:Pt:Ser/Plas:Qn:: 7

## 2019-10-14 LAB — HDL CHOLESTEROL: Cholesterol.in HDL:MCnc:Pt:Ser/Plas:Qn:: 47

## 2019-10-14 MED ADMIN — belimumab (BENLYSTA) 10 mg/kg = 730.4 mg in sodium chloride (NS) 250 mL IVPB: 10 mg/kg | INTRAVENOUS | @ 15:00:00 | Stop: 2019-10-14

## 2019-10-14 MED ADMIN — diphenhydrAMINE (BENADRYL) injection 50 mg: 50 mg | INTRAVENOUS | @ 14:00:00 | Stop: 2019-10-14

## 2019-10-14 MED ADMIN — ondansetron (ZOFRAN) tablet 8 mg: 8 mg | ORAL | @ 14:00:00 | Stop: 2019-10-14

## 2019-10-14 NOTE — Unmapped (Signed)
0945??Patient in today for Benlysta infusion. Patient has no s/s of infection. No issues from the last infusion.??  1031??Benlysta started ,patient instructed to use call bell /call nurse for any s/s of unsual symptoms during infusion. Patient educated on possible s/s of reaction,such as chestpain ,itching,shortness of breath lightheadedness and any kind of discomfort. Patient verbalized understanding...  1155??Infusion completed and well tolerated.  1215??Benlysta 730.4??mg/250??ml??NS infused over 1hr and??24??min.??per protocol . Pt alert and oriented, offered no complaints during infusion. IV flushed with 10ml NS . Patient discharged in stable condition

## 2019-10-16 LAB — LIPOPROTEIN A: Lipoprotein (little a):MCnc:Pt:Ser/Plas:Qn:: <6

## 2019-10-21 DIAGNOSIS — F3341 Major depressive disorder, recurrent, in partial remission: Principal | ICD-10-CM

## 2019-10-21 DIAGNOSIS — G894 Chronic pain syndrome: Principal | ICD-10-CM

## 2019-10-21 DIAGNOSIS — Z9149 Other personal history of psychological trauma, not elsewhere classified: Principal | ICD-10-CM

## 2019-10-21 DIAGNOSIS — M797 Fibromyalgia: Principal | ICD-10-CM

## 2019-10-21 MED ORDER — TRAZODONE 50 MG TABLET
ORAL_TABLET | Freq: Every evening | ORAL | 1 refills | 90 days | Status: CP
Start: 2019-10-21 — End: ?

## 2019-10-21 NOTE — Unmapped (Signed)
Call placed to patient regarding scheduling nurse visit appointment for Repatha injections and to explain the chain of custody for medication. Patient must keep medication in the original box, unopened and stored in the refrigerator. Patient has the option of transferring the medication to the [facility] pharmacy. No answer. LVMM requesting a call back.

## 2019-10-22 NOTE — Unmapped (Signed)
Patient verbalized understanding needing to have Repatha in refrigerated storage until brought to the clinic for injection. Patient states will contact pharmacy to have prescription sent to [facility]. Patient scheduled for nurse visits thru the end of September for Repatha injection.

## 2019-10-22 NOTE — Unmapped (Signed)
I spoke to patient and they are return your call  Best callback day/time: Anytime   Best callback number: [phone]    Please let me know if there is anything I can do to help.

## 2019-10-22 NOTE — Unmapped (Signed)
Follow up call placed to patient to schedule nurse visit and to advise regarding Repatha (storage and delivery to office in original packaging to office). No answer, LVMM.

## 2019-10-26 DIAGNOSIS — M545 Low back pain: Principal | ICD-10-CM

## 2019-10-26 NOTE — Unmapped (Signed)
Med- Trazadone refilled 10/21/2019. Laural Benes LPN

## 2019-10-27 NOTE — Unmapped (Signed)
Patient have scheduled nurse on 11/03/19 for repatha injection.  Closing the loop.

## 2019-11-01 MED FILL — EMGALITY PEN 120 MG/ML SUBCUTANEOUS PEN INJECTOR: SUBCUTANEOUS | 30 days supply | Qty: 1 | Fill #2

## 2019-11-01 MED FILL — EMGALITY PEN 120 MG/ML SUBCUTANEOUS PEN INJECTOR: 30 days supply | Qty: 1 | Fill #2 | Status: AC

## 2019-11-01 MED FILL — REPATHA SURECLICK 140 MG/ML SUBCUTANEOUS PEN INJECTOR: 84 days supply | Qty: 6 | Fill #0 | Status: AC

## 2019-11-02 MED ORDER — TRAMADOL 50 MG TABLET
ORAL_TABLET | Freq: Four times a day (QID) | ORAL | 0 refills | 5 days | Status: CP | PRN
Start: 2019-11-02 — End: ?

## 2019-11-03 ENCOUNTER — Ambulatory Visit: Admit: 2019-11-03 | Discharge: 2019-11-03 | Payer: MEDICARE

## 2019-11-03 ENCOUNTER — Institutional Professional Consult (permissible substitution): Admit: 2019-11-03 | Discharge: 2019-11-03 | Payer: MEDICARE

## 2019-11-03 DIAGNOSIS — G43119 Migraine with aura, intractable, without status migrainosus: Principal | ICD-10-CM

## 2019-11-03 MED ADMIN — galcanezumab-gnlm (EMGALITY) injection 120 mg: 120 mg | SUBCUTANEOUS | @ 16:00:00 | Stop: 2019-11-03

## 2019-11-03 NOTE — Unmapped (Signed)
Addended by: Fanny Skates on: 11/03/2019 12:09 PM     Modules accepted: Orders

## 2019-11-03 NOTE — Unmapped (Signed)
Patient added to my schedule for Repatha administration teaching.  Patient was sent down from IM this morning.  She was visibly frustrated about having to come down to Cardiology.  Patient was angry about the fact that we could not give her injections every 2 weeks since she thought that she came down to see nurse/ CPP for the injection.  I attempted to show her a dummy evolocumab (Repatha) Sureclick pen.  Olivia Stout stated that she was not interested in giving herself the injection.  She reported having too many issues in the past 3 years and she couldn't emotionally take on this challenge.  I inquired if she had a friend or family that could assist her but she reported there were no options for her.    I spoke with charge nurse, Olivia Stout who confirmed that patient was told that she could come to Cardiology for education.  Patient became irate and stated that if she has a stroke it will be your fault.  She stormed out of the room without her Repatha pen which was collected by the charge nurse.      I spent a total of 10 minutes face to face with the patient delivering clinical care and providing education/counseling.    Rafael Bihari, PharmD, BCACP, CPP  Clinical Pharmacist Practitioner  [facility]  [address] (Second Floor)  [place], [place] [address]  Phone: [phone]  Fax: [phone]

## 2019-11-03 NOTE — Unmapped (Addendum)
Emgality injection completed. Patient tolerated well. Patient also wanted to receive new injection of Repatha. Spoke with Dr. Hulan Fess who advised Cardiology would need to inject med for patient. Patient sent to Cardiology. Spoke to Thurnell Lose, RN advised they can't give injection. Spoke with Dr. Mariane Baumgarten who advised that there are no other alternatives for patient's diagnosis and Repatha is a simple injection like an Epi-Pen. Dr. Lodema Hong requested for Dr. Hulan Fess to call him for any issues. Informed Dr. Hulan Fess of everything that was advised to me. She reached out to Julious Oka and requested for patient to be sent back to Cardiology for Clinical Pharmacist to assist with injection. Patient advised to go to Cardiology and to stop at [facility] to pick up the rest of medication that was delivered by [facility]. Patient verbalized understanding.

## 2019-11-10 ENCOUNTER — Ambulatory Visit: Admit: 2019-11-10 | Discharge: 2019-11-11 | Payer: MEDICARE

## 2019-11-10 MED ADMIN — belimumab (BENLYSTA) 10 mg/kg = 726.4 mg in sodium chloride (NS) 250 mL IVPB: 10 mg/kg | INTRAVENOUS | @ 14:00:00 | Stop: 2019-11-10

## 2019-11-10 MED ADMIN — diphenhydrAMINE (BENADRYL) injection 50 mg: 50 mg | INTRAVENOUS | @ 14:00:00 | Stop: 2019-11-10

## 2019-11-10 MED ADMIN — ondansetron (ZOFRAN) tablet 8 mg: 8 mg | ORAL | @ 14:00:00 | Stop: 2019-11-10

## 2019-11-10 NOTE — Unmapped (Signed)
0945??Patient in today for Benlysta infusion. Patient has no s/s of infection. No issues from the last infusion.??  1025??Benlysta started ,patient instructed to use call bell /call nurse for any s/s of unsual symptoms during infusion. Patient educated on possible s/s of reaction,such as chestpain ,itching,shortness of breath lightheadedness and any kind of discomfort. Patient verbalized understanding...  1148??Infusion completed and well tolerated.  1200??Benlysta 726.4??mg/250??ml??NS infused over 1hr and??23??min.??per protocol . Pt alert and oriented, offered no complaints during infusion. IV flushed with 10ml NS . Patient discharged in stable condition

## 2019-11-17 NOTE — Unmapped (Signed)
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  Did not get serum tears, as she could not afford them.  Not covered by insurance.  She notes Irritation inner corners of both eyes.   Soothe tears two times a day both eyes. Takes fish oil.    Is there a better fish oil to take? Patient asked. Advised  Nordic Natural brand.   Inquiring about Bruder eye compress. Also recommended this to use BID  ??  Also recommended more frequent use of the Soothe, up to 6 x a day.    AmbioDisc Membrane placed on Left cornea today under the Bandage CL   ??  RTC 2 weeks for post op check

## 2019-11-18 ENCOUNTER — Ambulatory Visit: Admit: 2019-11-18 | Discharge: 2019-11-19 | Payer: MEDICARE

## 2019-11-18 MED ORDER — LOTEPREDNOL ETABONATE 0.5 % EYE OINTMENT
Freq: Every evening | OPHTHALMIC | 2 refills | 0 days | Status: CP
Start: 2019-11-18 — End: 2020-02-16

## 2019-11-19 NOTE — Unmapped (Signed)
PA for Loteprednol ointment denied by [facility]. Sent email to Dr. Julaine Hua.

## 2019-11-21 NOTE — Unmapped (Signed)
See documentation on 11/03/19.  This encounter was created in error - please disregard.

## 2019-11-21 NOTE — Unmapped (Signed)
I was the supervising physician in the delivery of the service. Olivia Shrode F Shirline Kendle, MD

## 2019-11-24 MED FILL — EMGALITY PEN 120 MG/ML SUBCUTANEOUS PEN INJECTOR: 30 days supply | Qty: 1 | Fill #3 | Status: AC

## 2019-11-24 MED FILL — EMGALITY PEN 120 MG/ML SUBCUTANEOUS PEN INJECTOR: SUBCUTANEOUS | 30 days supply | Qty: 1 | Fill #3

## 2019-11-25 MED ORDER — TRAMADOL 50 MG TABLET
ORAL_TABLET | Freq: Four times a day (QID) | ORAL | 0 refills | 5.00000 days | Status: CP | PRN
Start: 2019-11-25 — End: ?

## 2019-11-30 ENCOUNTER — Ambulatory Visit: Admit: 2019-11-30 | Discharge: 2019-12-01 | Payer: MEDICARE

## 2019-11-30 DIAGNOSIS — M461 Sacroiliitis, not elsewhere classified: Principal | ICD-10-CM

## 2019-11-30 DIAGNOSIS — M545 Low back pain: Principal | ICD-10-CM

## 2019-11-30 NOTE — Unmapped (Addendum)
 Department of Anesthesiology  [facility]  [address]  [place], [place] [address]  [phone]    Date: November 30, 2019  Patient Name: Olivia Stout  MRN: [id number]  PCP: Lolita Lenz  Referring Provider: Conan Bowens, MD    Assessment:   Attending: Kavitha Webbe is a 69 y.o. female. [facility] pain. Patient has PMHx significant for HTN, lymphoma, lupus, and MDD.    Right Sacroilitis  Likely with a component of myofascial pain. Patient has scoliosis at L2-L3 and a prior L4-L5 fusion that certainly put her at risk for SI joint dysfunction as well as muscle tightness in her low back. Recent imaging showing disc material approaching exiting left L5 nerve root, but patient endorses worse symptoms on her right low back radiating down her right leg on occasion. Given this, less concern for radiculopathy. FABERs test positive for R SI-related pain, physical exam otherwise normal besides tenderness to palpation over L5 and right lumbar paraspinal muscles wrapping around to right hip. Patient has had significant relief from injections in the past, although unclear if these were ESI or SI joint injections. Patient endorses sensitivity to multiple medications, and has not tolerated an uptitration of her gabapentin. Given this, will take a conservative approach from a medication standpoint. Discussed with patient that physical therapy would likely be very beneficial to her in terms of strengthening and stretching the muscles in her low back, and she was amenable to this. Also offered her a R SI joint injection which she was interested in pursuing.    Current Pain Provider: no  [place] DOC: None  [place] database was reviewed 11/30/19.      1. Sacroiliitis (CMS-HCC)    2. Low back pain, unspecified back pain laterality, unspecified chronicity, unspecified whether sciatica present        Plan:   - Referral to PT placed  - R SI joint injection to be scheduled  - Continue medications as prescribed by PCP  -Return in about 3 months (around 02/29/2020).      Orders Placed This Encounter   Procedures   ??? SI Joint Inj     Right SI  Jowza     Standing Status:   Future     Standing Expiration Date:   11/29/2020   ??? Ambulatory referral to Physical Therapy     Standing Status:   Future     Standing Expiration Date:   11/29/2020     Referral Priority:   Routine     Referral Type:   Physical Therapy     Referred to Provider:   [facility]     Number of Visits Requested:   1     Requested Prescriptions      No prescriptions requested or ordered in this encounter       Subjective:     HPI:  [name] is seen in consultation at the request of Laxer, Cato Mulligan, MD  For evaluation and recommendations regarding Her chronic lower back pain. Patient has PMHx significant for HTN, lymphoma, lupus, and MDD.    Patient states that she had back surgery in 2017, and had about 6 months of relief. Her back pain progressively started to get worse in 2018, and have acutely worsened over the past 6 months. Starts at one point in the center of her low back and radiates  around her right. She states that she does have some shooting pain down both legs, radiates past her knee on the right side. Pillows placed behind her back helps for a little while. Laying in bed at night has to bring her knees to her chest in order to get comfortable. Walking also bothers her back. Pressing her back against a wall is helpful to her. Her PCP prescribed her tramadol 25 mg every night which she states helps. Takes gabapentin 300 at night, has not tolerated increasing that dose. She is unsure if this medication is helpful to her. She also uses aspercreme at night which she states helps. Tylenol was not helpful. Is no longer taking naproxen because it can intensify her migraines.     She states prior to back surgery she would get injections in her right low back that helped for quite a while. She would get them about every 6 months. She states that  I am not living a normal life.    Pain Clinic? No    Current Medications:  Tramadol 50mg  q6h prn  Gabapentin 300mg  at night  Tizandine 2mg  q8h prn  Cymbalta 60mg  every day  Klonopin 1mg  at bedtime  Trazodone 50mg  QHS  Naproxen 500mg  BID prn  APAP 500mg  q6h prn    Her pain started approximately  8 years ago.  Her pain involving bilateral, right lower back and legs.    Her pain is described as nauseating, sharp, shooting, stabbing and tender  DAMA BOGGAN is  presenting to the clinic today with pain that is graded as 8/10 in intensity. Her highest level of pain is reported as 8/10 in intensity, least pain level is 6/10 and average pain level is 8/10.  Her pain started Over time.  Her pain symptoms are associated with numbness and stiffness.  The patient has not experienced lost of bowel or bladder control.  Her pain is made worse with sitting, lying and standing.  Her pain is present all of the time   Her pain is improved with medication.     Previous interventions include Medications, Nerve Blocks and Surgery    Previous tests include XRAYs and CT scan  Workmans Compensation is not involved.  This is not involved with a lawsuit or lawyer  The treatment goals include Complete resolution with medications if necessary    Previous Medication Trials: gabapentin, lidoderm and Tramadol    Past Medical History:   Diagnosis Date   ??? Allergy desensitization therapy    ??? Anxiety    ??? Arthritis    ??? Back pain, chronic 2003    pinched nerve   ??? Depression    ??? Disease of thyroid gland    ??? Dry eyes     Had permanent cautery lower canula, tried plugs  and partial but caused waatering.   ??? Dysphagia    ??? Dysplastic nevi    ??? Eye trauma     wood chip with corneal abrasion right eye.  metal right eye removed also with abrasion   ??? Fibromyalgia    ??? Fibromyalgia, primary    ??? GERD (gastroesophageal reflux disease)    ??? High cholesterol ??? History of gastric ulcer    ??? History of underactive thyroid    ??? Hypertension     watching   ??? IBS (irritable bowel syndrome)    ??? Long term prescription benzodiazepine use 06/29/2017   ??? Long-term use of Plaquenil     Stopped 2017   ???  Lupus (CMS-HCC)    ??? Lymphoma (CMS-HCC)     in remission; s/p chemotherapy - NON HODGKINS   ??? Major depressive disorder    ??? Mixed stress and urge urinary incontinence 02/08/2013   ??? Sjoegren syndrome    ??? Urinary, incontinence, stress female    ??? Vaginal prolapse      Past Surgical History:   Procedure Laterality Date   ??? BACK SURGERY  07/27/2015   ??? ESOPHAGEAL DILATION     ??? FRACTURE SURGERY      left forearm   ??? HYSTERECTOMY     ??? KNEE SURGERY     ??? LAPAROSCOPIC NISSEN FUNDOPLICATION     ??? PR ANAL PRESSURE RECORD Left 12/01/2012    Procedure: ANORECTAL MANOMETRY;  Surgeon: None None;  [facility];  Service: Gastroenterology   ??? PR BREATH HYDROGEN TEST N/A 12/01/2012    Procedure: BREATH HYDROGEN TEST;  Surgeon: None None;  [facility];  Service: Gastroenterology   ??? SKIN BIOPSY     ??? SPINE SURGERY     ??? TAH and bladder surgery      partial   ??? TONSILLECTOMY       Family History   Problem Relation Age of Onset   ??? Diabetes Mother    ??? Crohn's disease Mother    ??? Emphysema Mother    ??? Glaucoma Mother    ??? Asthma Mother    ??? Osteoporosis Mother    ??? Parkinsonism Mother    ??? Stroke Father    ??? Heart disease Father    ??? Cancer Father    ??? Migraines Father    ??? Hypertension Sister    ??? Migraines Sister    ??? Migraines Daughter    ??? No Known Problems Son    ??? No Known Problems Son    ??? No Known Problems Brother    ??? No Known Problems Maternal Aunt    ??? No Known Problems Maternal Uncle    ??? No Known Problems Paternal Aunt    ??? No Known Problems Paternal Uncle    ??? No Known Problems Maternal Grandmother    ??? No Known Problems Maternal Grandfather    ??? No Known Problems Paternal Grandmother    ??? No Known Problems Paternal Grandfather    ??? No Known Problems Other    ??? ADD / ADHD Neg Hx    ??? Alcohol abuse Neg Hx    ??? Anxiety disorder Neg Hx    ??? Bipolar disorder Neg Hx    ??? Dementia Neg Hx    ??? Depression Neg Hx    ??? Drug abuse Neg Hx    ??? OCD Neg Hx    ??? Paranoid behavior Neg Hx    ??? Physical abuse Neg Hx    ??? Schizophrenia Neg Hx    ??? Sexual abuse Neg Hx    ??? Seizures Neg Hx    ??? Macular degeneration Neg Hx    ??? Blindness Neg Hx    ??? Melanoma Neg Hx    ??? Basal cell carcinoma Neg Hx    ??? Squamous cell carcinoma Neg Hx    ??? Amblyopia Neg Hx    ??? Cataracts Neg Hx    ??? Retinal detachment Neg Hx    ??? Strabismus Neg Hx    ??? Thyroid disease Neg Hx        Social History:  She reports that she has never  smoked. She has never used smokeless tobacco. She reports that she does not drink alcohol and does not use drugs.    The patient is married  The patient has children: Yes, grown kids and grandkids  The patient is living with spouse/partner  Highest level of education: associates degree  Current Employment: Not working/unemployed  Exercise: once in a while dose some stretching  Recreational Drugs: no  Treatment for Substance abuse: no  Use anothers prescription medications: no    Allergies as of 11/30/2019 - Reviewed 11/30/2019   Allergen Reaction Noted   ??? Amoxicillin Swelling 09/13/2014   ??? Doxycycline Rash 04/15/2018   ??? Ketorolac Shortness Of Breath 07/07/2012   ??? Sulfa (sulfonamide antibiotics) Other (See Comments) 07/07/2012   ??? Benzalkonium chloride Other (See Comments) 09/23/2018   ??? Statins-hmg-coa reductase inhibitors Other (See Comments) and Muscle Pain 06/07/2016   ??? Zocor [simvastatin] Other (See Comments) and Muscle Pain 07/07/2012      Current Outpatient Medications   Medication Sig Dispense Refill   ??? acetaminophen (TYLENOL) 500 MG tablet Take 500 mg by mouth every six (6) hours as needed for pain.     ??? albuterol (PROVENTIL HFA;VENTOLIN HFA) 90 mcg/actuation inhaler Inhale 2 puffs. As needed     ??? ammonium lactate (AMLACTIN) 12 % cream Apply topically once daily. 400 g 6   ??? ammonium lactate (LAC-HYDRIN) 12 % lotion As needed     ??? azelastine (ASTELIN) 137 mcg (0.1 %) nasal spray 2 sprays by Each Nare route Two (2) times a day. 30 mL 11   ??? belimumab (BENLYSTA) 400 mg SolR Infuse 720 mg into a venous catheter every thirty (30) days.     ??? cholecalciferol, vitamin D3, (VITAMIN D3) 1,000 unit capsule Take 1,000 Units by mouth daily.     ??? clonazePAM (KLONOPIN) 1 MG tablet TAKE 1 TABLET BY MOUTH AT BEDTIME AS NEEDED FOR SLEEP 30 tablet 0   ??? DULoxetine (CYMBALTA) 60 MG capsule TAKE 1 CAPSULE BY MOUTH ONCE DAILY 90 capsule 0   ??? empty container Misc Use as directed 1 each PRN   ??? EPINEPHrine (EPIPEN) 0.3 mg/0.3 mL injection Inject 0.3 mL (0.3 mg total) into the muscle Take as directed. Note:carry at all times 1 each 3   ??? estradiol (ESTRACE) 0.01 % (0.1 mg/gram) vaginal cream Insert into the vagina.     ??? evolocumab (REPATHA SURECLICK) 140 mg/mL PnIj Inject the contents of 1 pen (140 mg) under the skin every fourteen (14) days. 6 mL 4   ??? gabapentin (NEURONTIN) 100 MG capsule Take 2 capsules (200 mg total) by mouth every morning. 180 capsule 3   ??? gabapentin (NEURONTIN) 300 MG capsule Take 1 capsule (300 mg total) by mouth nightly. 90 capsule 3   ??? galcanezumab-gnlm (EMGALITY PEN) 120 mg/mL injection Inject the contents of 1 pen (120 mg) under the skin every thirty (30) days. 1 mL 11   ??? hydroCHLOROthiazide (HYDRODIURIL) 25 MG tablet Take 1 tablet (25 mg total) by mouth daily. 90 tablet 3   ??? levocetirizine (XYZAL) 5 MG tablet TAKE 1 TABLET BY MOUTH EVERY DAY 90 tablet 4   ??? levothyroxine (SYNTHROID) 50 MCG tablet Take 1 tablet (50 mcg total) by mouth daily. 90 tablet 3   ??? loteprednol etabonate 0.5 % Oint Administer to both eyes nightly. 3.5 g 2   ??? multivitamin (TAB-A-VITE/THERAGRAN) per tablet Take 1 tablet by mouth daily.     ??? naproxen (NAPROSYN) 500 MG tablet Take 1 tablet (  500 mg total) by mouth 2 (two) times a day with meals. TAKE 1 TABLET (500 MG TOTAL) BY MOUTH 2 (TWO) TIMES A DAY WITH MEALS. 60 tablet 11   ??? omeprazole (PRILOSEC) 40 MG capsule Take 1 capsule (40 mg total) by mouth daily. 90 capsule 2   ??? tiZANidine (ZANAFLEX) 2 MG tablet Take 1 tablet (2 mg total) by mouth every eight (8) hours as needed (muscle pain) for up to 30 doses. 30 tablet 0   ??? traMADoL (ULTRAM) 50 mg tablet Take 1 tablet (50 mg total) by mouth every six (6) hours as needed for pain. 20 tablet 0   ??? traZODone (DESYREL) 50 MG tablet Take 1 tablet (50 mg total) by mouth nightly. 90 tablet 1   ??? triamcinolone (KENALOG) 0.1 % ointment Apply topically in the evening to ear. 15 g 2   ??? fluticasone propionate (FLONASE) 50 mcg/actuation nasal spray 2 sprays by Each Nare route Two (2) times a day. 16 mL prn     Current Facility-Administered Medications   Medication Dose Route Frequency Provider Last Rate Last Admin   ??? galcanezumab-gnlm (EMGALITY) injection 120 mg  120 mg Subcutaneous Once Delphina Cahill, MD         Imaging/Tests:   CT Lumbar Spine 10/07/19  IMPRESSION:   1. ??Negative for acute fracture or dislocation.   2. ??Previous posterior fusion at L4-L5 with pedicle screws extending partially through the lateral cortex at L4 and L5, no evidence of fusion across the disc space and disc bulge versus annular scarring posteriorly at the L4-L5 level.   3. ??Apex left lumbar rotoscoliosis with degenerative subluxations.   4. ??Multilevel disc bulges and protrusions with mild canal and mild to moderate lateral recess narrowing as described above.   5. ??Moderate neural foraminal narrowing at multiple levels especially at L4-L5 and L5-S1 with disc material approaching the exiting left L5 nerve root at L5-S1.       XR Lumbar Spine 08/12/19  FINDINGS:   Levoscoliosis of the lumbar spine with apex at L2-L3. Left lateral listhesis of L3 on L4 measures 7 mm, similar to prior.  Posterior decompression and fusion L4-L5. Perihardware lucency about the left L4 pedicle screw, best appreciated on the lateral view, is a from prior.  Advanced degenerative disc disease at L2-L3 and L3-L4, similar to prior. Mild degenerative disc disease at L5-S1, similar to prior. Moderate L5-S1 facet arthropathy, left greater than right, similar to prior.  Unremarkable sacroiliac joints and soft tissues.    IMPRESSION:  1. Levoscoliosis of lumbar spine with apex at L2-L3 and left lateral listhesis of L3 on L4, similar to prior.  2. Posterior decompression and fusion L4-L5. New perihardware lucency about the L4 pedicle screw.  3. Multilevel degenerative disc disease at unfused levels, similar to prior.  4. Moderate L5-S1 facet arthropathy, similar to prior.    Urine toxiciology screen  Lab Results   Component Value Date    Amphetamine Screen, Ur <500 ng/mL 07/05/2014    Amphetamines Screen, Ur <500 ng/mL 03/14/2015    Barbiturate Screen, Ur <200 ng/mL 07/05/2014    Barbiturates Screen, Ur <200 ng/mL 03/14/2015    Benzodiazepine Screen, Urine =/>200 ng/mL (A) 07/05/2014    Benzodiazepines Screen, Urine =/>200 ng/mL (A) 03/14/2015    Cannabinoid Scrn, Ur <20 ng/mL 07/05/2014    Cannabinoids Screen, Ur <20 ng/mL 03/14/2015    Methadone Screen, Urine <300 ng/mL 03/14/2015    Methadone Screen, Urine <300 ng/mL 07/05/2014    Opiate  Scrn, Ur <300 ng/mL 07/05/2014    Opiates Screen, Ur <300 ng/mL 03/14/2015    Cocaine(Metab.)Screen, Urine <150 ng/mL 03/14/2015    Cocaine(Metab.)Screen, Urine <150 ng/mL 07/05/2014       OPIOID CONFIRMATION:  Lab Results   Component Value Date    Buprenorphine <5 07/05/2014    Norbuprenorphine <5 11/29/2014    Norbuprenorphine <5 07/05/2014    Codeine Confirm <50 07/05/2014    Hydrocodone Confirm <50 11/29/2014    Hydrocodone Confirm <50 07/05/2014    Hydromorphone Confirm <50 11/29/2014    Hydromorphone Confirm <50 07/05/2014    Morphine Confirm <50 11/29/2014    Morphine Confirm <50 07/05/2014    Oxycodone Confirm 226 11/29/2014    Oxycodone Confirm 122 07/05/2014    Oxymorphone 114 11/29/2014 Oxymorphone 82 07/05/2014    6-Monoacetylmrph <20 11/29/2014    6-Monoacetylmrph <20 07/05/2014    Opiate Interp POSITIVE 11/29/2014    Opiate Interp Positive 07/05/2014        BENZODIAZEPINE CONFIRMATION:  Lab Results   Component Value Date    Buprenorphine <5 07/05/2014       Review of Systems:  GENERAL: fatigue, difficulty sleeping  HEENT: blurred vision  SKIN:none  PULMONARY:none  ENDOCRINE: none  GASTROINTESTINAL:nausea and heartburn  GENITOURINARY:frequent urination  MUSCULOSKELETAL:joint aches/, joint swelling and back pain  CARDIOVASCULAR:none  NEUROLOGIC:dizziness/vertigo, headaches/migraines, numbness/tingling  PSYCHIATRIC: depression, anxiety, panic attacks  She denies any homicidal or suicidal ideation.   HEMATOLOGIC: easy bruising  IMMUNOLOGIC: allergies    Objective:     PHYSICAL EXAM:  BP 141/79  - Pulse 73  - Temp 36.6 ??C  - Resp 16  - Ht 157.5 cm (5' 2)  - Wt 73 kg (160 lb 14.4 oz)  - SpO2 99%  - BMI 29.43 kg/m??   Wt Readings from Last 3 Encounters:   11/30/19 73 kg (160 lb 14.4 oz)   11/10/19 72.6 kg (160 lb)   10/14/19 73 kg (161 lb)     GENERAL: Well developed, well-nourished female and is in no apparent distress. The patient is pleasant and interactive. Patient is a good historian.  No evidence of sedation or intoxication.  No overt pain behaviors  HEENT: Normocephalic/atraumatic. Clear sclera. Mucous membranes are moist.  CARDIOVASCULAR:  Warm, well perfused  RESPIRATORY:  Normal work of breathing, no supplemental 02  EXTREMITIES: No clubbing, cyanosis noted.  Joints appear normal.  GASTROINTESTINAL: Soft, nondistended  NEUROLOGIC:  Alert and oriented, speech fluent, normal language. Cranial nerves grossly intact.  Sensation Intact to light touch throughout the bilateral upper and lower extremities.. Reflexes were Normal and were equal in upper and lower extremities    Upper extremity    Strength:  C5 Shoulder Abduction 5/5 bilateral  C6 Elbow Flexion 5/5 bilateral  C7 Elbow Extension 5/5 bilateral  C8 Finger Flexion 5/5 bilateral  T1 Finger Abduction 5/5 bilateral    Reflexes:  C6 Brachioradialis present bilaterally      Lower Extremity    Strength:  Hip Flexion 5/5 bilateral  Knee Extension 5/5 bilateral      Reflexes:  L4 Patellar present and equal    MUSCULOSKELETAL:  Motor function  5/5 in upper and lower extremities with normal tone and bulk. Good range of motion of all extremities. Patient rises from a seated position with minimal difficulty.  The patient was able to ambulate with minimal difficulty throughout the clinic today without the assistance of a walking aid-None.     SPINE:The patient does  have pain with palpation of  the Lumbar back with spine percussion and deep muscular palpation.  Abnormal flexion and Abnormal extension at the waist. S Patrick's maneuver Positive on right.   SKIN: No obvious rashes lesions or erythema on the exposed skin  PSYCHIATRIC:Appropriate, full range affect, no psychomotor retardation    I have personally reviewed the patient's medical record.   I have personally reviewed the patient's clinical labs and/or urine toxicology studies  I have independently reviewed patient's imaging studies.   I have not requested patient's old medical records from the prior provider.   The patient's significant other and/or family member/friend was present during this visit and has contributed important medical information as pertinent to patient's medical history.   I have reviewed the [place] Narcotic Database today

## 2019-11-30 NOTE — Unmapped (Signed)
Provided written and verbal pre -procedure information. Pt voiced understanding Pt denies taking any blood thinners.  PT referral also provided

## 2019-11-30 NOTE — Unmapped (Addendum)
Ms. Blackard,    It was a pleasure meeting you today! Today we discussed the following:    1. A referral was placed for physical therapy. We have provided you the information for PT at [facility]. Please call them at [phone] to schedule an appointment.    2. [facility] will call you to schedule a Right Sacroiliac Joint Injection.  If you do not hear from them in a few days, you can call [phone] to get scheduled.  Their address is:     [address]  [place], [place] [address]    3. Below are some stretches that can help in the meantime. Be very gentle, if you feel any sharp pains, come out of the stretch.  Stretches for Low Back Pain  ?? Back Flexion Stretch.??Lying on the back, pull both knees to the chest while simultaneously flexing the head forward until a comfortable stretch is felt across the mid and low back.  ?? Knee to Chest Stretch.??Lie on the back with the knees bent and both heels on the floor, then place both hands behind one knee and pull it toward the chest, stretching the gluteus and piriformis muscles in the buttock.  ?? Kneeling Lunge Stretch.??Starting on both knees, move one leg forward so the foot is flat on the ground, keeping weight evenly distributed through both hips (rather than on one side or the other). Place both hands on the top of the thigh, and gently lean the body forward to feel a stretch in the front of the other leg. This stretch affects the hip flexor muscles, which attach to the pelvis and can impact posture if too tight.  ?? Piriformis Muscle Stretch.??Lie on the back with knees bent and both heels on the floor. Cross one leg over the other, resting the ankle on the bent knee, then gently pull the bottom knee toward the chest until a stretch is felt in the buttock. Or, lying on the floor, cross one leg over the other and pull it forward over the body at the knee, keeping the other leg flat.    Take care, and see you soon!

## 2019-12-02 ENCOUNTER — Ambulatory Visit: Admit: 2019-12-02 | Discharge: 2019-12-03 | Payer: MEDICARE

## 2019-12-02 ENCOUNTER — Institutional Professional Consult (permissible substitution): Admit: 2019-12-02 | Discharge: 2019-12-03 | Payer: MEDICARE

## 2019-12-02 DIAGNOSIS — M064 Inflammatory polyarthropathy: Principal | ICD-10-CM

## 2019-12-02 DIAGNOSIS — M3219 Other organ or system involvement in systemic lupus erythematosus: Principal | ICD-10-CM

## 2019-12-02 DIAGNOSIS — H5789 Other specified disorders of eye and adnexa: Principal | ICD-10-CM

## 2019-12-02 DIAGNOSIS — M3501 Sicca syndrome with keratoconjunctivitis: Principal | ICD-10-CM

## 2019-12-02 MED ORDER — LOTEPREDNOL ETABONATE 0.5 % EYE OINTMENT
Freq: Four times a day (QID) | OPHTHALMIC | 3 refills | 0 days | Status: CP
Start: 2019-12-02 — End: 2020-01-01

## 2019-12-02 MED ADMIN — galcanezumab-gnlm (EMGALITY) injection 120 mg: 120 mg | SUBCUTANEOUS | @ 15:00:00 | Stop: 2019-12-02

## 2019-12-02 NOTE — Unmapped (Signed)
Emgality injection was given in the left leg above knee patient tolerated it well.

## 2019-12-03 DIAGNOSIS — M3219 Other organ or system involvement in systemic lupus erythematosus: Principal | ICD-10-CM

## 2019-12-05 NOTE — Unmapped (Signed)
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   States she could actually see better. Now it feels irritated and vision is not as clear.   Left eye > right eye tearing, itching, burning. Hurt around my sockets    Soothe PRN both eyes (at least once a day, sometimes twice a day). Warm, moist,    Compresses every day.  Her insurance denied the Lotemax ointment and   now her eyes and eyelids are very inflamed.    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        Pt really needs to be on this medication as she needs a preservative free       Formulation that will not elevate IOP.     # Sjogren's K Sicca-- some improvement with amniotic membrane Tx, but still needs         Lotemax as part of anti-inflammatory Treatment.     RTC 6 weeks

## 2019-12-08 NOTE — Unmapped (Signed)
msg. on VM: I've been referred there from pain, and I have some general questions. How long will it take to get the injection? I'm supposed to have some IV sedation.     I called her back today, thinking that she had been referred to the Spine clinic. No, she had misdirected her call with questions about her injection. I answered her questions re: time frame the day of inj. (her husband will be bringing her) as well as how long she might be sore after the inj. Passing along to you so you'll know that she did speak with a nurse. 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 so I didn't mind speaking with her.)

## 2019-12-09 ENCOUNTER — Ambulatory Visit: Admit: 2019-12-09 | Discharge: 2019-12-10 | Payer: MEDICARE

## 2019-12-09 LAB — CBC W/ AUTO DIFF
BASOPHILS ABSOLUTE COUNT: 0.1 10*9/L (ref 0.0–0.1)
EOSINOPHILS ABSOLUTE COUNT: 0.2 10*9/L (ref 0.0–0.7)
EOSINOPHILS RELATIVE PERCENT: 2.7 %
HEMATOCRIT: 44.6 % — ABNORMAL HIGH (ref 35.0–44.0)
HEMOGLOBIN: 15.1 g/dL (ref 12.0–15.5)
LYMPHOCYTES ABSOLUTE COUNT: 2.3 10*9/L (ref 0.7–4.0)
LYMPHOCYTES RELATIVE PERCENT: 41 %
MEAN CORPUSCULAR HEMOGLOBIN CONC: 33.7 g/dL (ref 30.0–36.0)
MEAN CORPUSCULAR HEMOGLOBIN: 32.1 pg (ref 26.0–34.0)
MEAN CORPUSCULAR VOLUME: 95.1 fL (ref 82.0–98.0)
MEAN PLATELET VOLUME: 7.4 fL (ref 7.0–10.0)
MONOCYTES ABSOLUTE COUNT: 0.4 10*9/L (ref 0.1–1.0)
NEUTROPHILS RELATIVE PERCENT: 47.7 %
NUCLEATED RED BLOOD CELLS: 0 /100{WBCs} (ref <=4)
PLATELET COUNT: 259 10*9/L (ref 150–450)
RED BLOOD CELL COUNT: 4.69 10*12/L (ref 3.90–5.03)
RED CELL DISTRIBUTION WIDTH: 12.6 % (ref 12.0–15.0)
WBC ADJUSTED: 5.6 10*9/L (ref 3.5–10.5)

## 2019-12-09 LAB — CREATININE
Creatinine:MCnc:Pt:Ser/Plas:Qn:: 0.92
EGFR CKD-EPI AA FEMALE: 75 mL/min/{1.73_m2} (ref >=60)
EGFR CKD-EPI NON-AA FEMALE: 65 mL/min/{1.73_m2} (ref >=60)

## 2019-12-09 LAB — BLOOD UREA NITROGEN: Urea nitrogen:MCnc:Pt:Ser/Plas:Qn:: 13

## 2019-12-09 LAB — C3 COMPLEMENT: Complement C3:MCnc:Pt:Ser/Plas:Qn:: 153

## 2019-12-09 LAB — MEAN PLATELET VOLUME: Platelet mean volume:EntVol:Pt:Bld:Qn:Automated count: 7.4

## 2019-12-09 LAB — C4 COMPLEMENT: Complement C4:MCnc:Pt:Ser/Plas:Qn:: 27.1

## 2019-12-09 MED ADMIN — belimumab (BENLYSTA) 10 mg/kg = 726.4 mg in sodium chloride (NS) 250 mL IVPB: 10 mg/kg | INTRAVENOUS | @ 14:00:00 | Stop: 2019-12-09

## 2019-12-09 MED ADMIN — ondansetron (ZOFRAN) tablet 8 mg: 8 mg | ORAL | @ 14:00:00 | Stop: 2019-12-09

## 2019-12-09 MED ADMIN — diphenhydrAMINE (BENADRYL) injection 50 mg: 50 mg | INTRAVENOUS | @ 14:00:00 | Stop: 2019-12-09

## 2019-12-09 NOTE — Unmapped (Signed)
0915??Patient in today for Benlysta infusion. Patient has no s/s of infection. No issues from the last infusion.??  1025??Benlysta started ,patient instructed to use call bell /call nurse for any s/s of unsual symptoms during infusion. Patient educated on possible s/s of reaction,such as chestpain ,itching,shortness of breath lightheadedness and any kind of discomfort. Patient verbalized understanding...  1145??Infusion completed and well tolerated.  1200??Benlysta 726.4??mg/250??ml??NS infused over 1hr and??20??min.??per protocol . Pt alert and oriented, offered no complaints during infusion. IV flushed with 10ml NS . Patient discharged in stable condition

## 2019-12-14 LAB — DSDNA ANTIBODY: Lab: NEGATIVE

## 2019-12-21 ENCOUNTER — Ambulatory Visit: Admit: 2019-12-21 | Discharge: 2019-12-22 | Payer: MEDICARE

## 2019-12-21 DIAGNOSIS — Z131 Encounter for screening for diabetes mellitus: Principal | ICD-10-CM

## 2019-12-21 DIAGNOSIS — M797 Fibromyalgia: Principal | ICD-10-CM

## 2019-12-21 DIAGNOSIS — M461 Sacroiliitis, not elsewhere classified: Principal | ICD-10-CM

## 2019-12-21 DIAGNOSIS — E039 Hypothyroidism, unspecified: Principal | ICD-10-CM

## 2019-12-21 DIAGNOSIS — Z8639 Personal history of other endocrine, nutritional and metabolic disease: Principal | ICD-10-CM

## 2019-12-21 MED ORDER — OMEPRAZOLE 40 MG CAPSULE,DELAYED RELEASE
ORAL_CAPSULE | Freq: Every day | ORAL | 3 refills | 90.00000 days | Status: CP
Start: 2019-12-21 — End: ?

## 2019-12-21 MED ORDER — LEVOTHYROXINE 50 MCG TABLET
ORAL_TABLET | Freq: Every day | ORAL | 3 refills | 90.00000 days | Status: CP
Start: 2019-12-21 — End: ?

## 2019-12-21 MED ORDER — GABAPENTIN 300 MG CAPSULE
ORAL_CAPSULE | Freq: Every evening | ORAL | 3 refills | 90 days | Status: CP
Start: 2019-12-21 — End: ?

## 2019-12-21 MED ORDER — TRAMADOL 50 MG TABLET
ORAL_TABLET | Freq: Four times a day (QID) | ORAL | 1 refills | 5 days | Status: CP | PRN
Start: 2019-12-21 — End: ?

## 2019-12-21 MED ORDER — GABAPENTIN 100 MG CAPSULE
ORAL_CAPSULE | Freq: Every morning | ORAL | 3 refills | 90.00000 days | Status: CP
Start: 2019-12-21 — End: ?

## 2019-12-21 MED FILL — EMGALITY PEN 120 MG/ML SUBCUTANEOUS PEN INJECTOR: SUBCUTANEOUS | 30 days supply | Qty: 1 | Fill #4

## 2019-12-21 MED FILL — EMGALITY PEN 120 MG/ML SUBCUTANEOUS PEN INJECTOR: 30 days supply | Qty: 1 | Fill #4 | Status: AC

## 2019-12-21 NOTE — Unmapped (Addendum)
You are overdue for a mammogram and a bone density study.  Call ??[phone] to schedule your bone scan.    You are eligible for your COVID19 booster.  You can call our clinic to schedule this.    Regarding your injections:  We are unable to give repatha injections in our internal medicine clinic per our clinic guidelines    If you would like to discuss your complaint with patient relations, their number is [phone]

## 2019-12-21 NOTE — Unmapped (Signed)
[facility] at [facility]       Type of visit:  face to face    Reason for visit: follow     Questions / Concerns that need to be addressed:follow up    General Consent to Treat (GCT) for non-epic video visits only: Verbal consent      Screening BP- 136/86, 78    Allergies reviewed: Yes    Medication reviewed: Yes  Pended refills? Yes        HCDM reviewed and updated in Epic:    We are working to make sure all of our patients??? wishes are updated in Epic and part of that is documenting a Environmental health practitioner for each patient  A Health Care Decision Rodena Piety is someone you choose who can make health care decisions for you if you are not able ??? who would you most want to do this for you????  was updated.        BPAs completed:  Influenza vaccination      COVID-19 Vaccine Summary  Type:  Complete  Dates Given:  06/07/2019                   Immunization History   Administered Date(s) Administered   ??? COVID-19 VACC,MRNA,(PFIZER)(PF)(IM) 05/17/2019, 06/07/2019   ??? INFLUENZA TIV (TRI) PF (IM) 02/01/2005, 01/07/2008, 12/25/2009, 03/05/2011   ??? Influenza Vaccine Quad (IIV4 PF) 4mo+ injectable 02/11/2012, 12/29/2012, 01/11/2014, 01/01/2017, 12/31/2017, 02/10/2019   ??? Influenza Virus Vaccine, unspecified formulation 12/24/2014, 12/31/2017   ??? Novel Influenza-h1n1-09, All Formulations 03/10/2008   ??? PNEUMOCOCCAL POLYSACCHARIDE 23 02/19/2011, 02/10/2019   ??? Pneumococcal Conjugate 13-Valent 03/27/2017   ??? TdaP 02/19/2011       __________________________________________________________________________________________    SCREENINGS COMPLETED IN FLOWSHEETS    HARK Screening       AUDIT       PHQ2       PHQ9          P4 Suicidality Screener                GAD7       COPD Assessment       Falls Risk

## 2019-12-21 NOTE — Unmapped (Signed)
Internal Medicine Clinic Visit    Reason for visit: Follow-up    A/P:    1. Fibromyalgia    2. Hypothyroidism, unspecified type    3. Screening for diabetes mellitus    4. Personal history of other endocrine, nutritional and metabolic disease     5. Sacroiliitis (CMS-HCC)        Chronic pain, sacroillitis, back pain, and fibromyaglia  -takes 300 gabapentin at night and 100mg  during the day, also takes 15 ultram over a month  -taking 20 mg ultram monthly, provided one month supply with one refill    Migraine  -continue emgality, and follow-up with Dr. Rulon Eisenmenger  -comes in to clinic once a month for this as unable to give it herself, she would like to have the same person give the injection every time, but discussed this isn't possible    ??Hyperlipidemia  -does not want to return to [facility] cardiology for repatha injections, so will try cardiology in [place]  -she felt she wasn't treated well when she went to get her repatha injection, provided patient relations phone number    Hypothyroidism, unspecified type  -     levothyroxine (SYNTHROID) 50 MCG tablet; Take 1 tablet (50 mcg total) by mouth daily.  Will check TSH at next infusion lab draw  ??  Essential hypertension  -     always slightly above goal in clinic, but home BP normal  ??  Anxiety  - followed by psychiatry for klonopin, and cymbalta  ??  Screening breast examination  -     Mammography screening bilateral ordered but patient wants to delay for now due to back pain  ??  Menopause  -     XR Dexa Bone Density Skeletal; ordered previously, added the phone number to schedule this    ??  Other systemic lupus erythematosus with other organ involvement (CMS-HCC)  Inflammatory polyarthropathy (CMS-HCC)  -followed by rheum and on infusion therapy  ??  Health maintenance  -gettting flu shot today  -will schedule COVID BOOSTER     Return in about 3 months (around 03/21/2020).    __________________________________________________________    HPI:  Plans to get Flu shot today and COVID vaccine booster at next visit.  She talked with back surgeon, and doesn't want to have another back surgery.  Will try to do PT once gets injection done..  Has lots of pain in her back, and sometimes can't drive.      Has a cataract in her eye, and is waiting to get this repaired.      Trying to get repatha injections, and then get emgality.  She was told she had to give her the shot herself.   She was crying after her interaction that day in cardiology.  Repatha is every two weeks.  Worried about her high cholesterol.      Has had more headaches this month, because she didn't get her full medicine.  As they had to pull it out early and it dripped on her leg.    Having panic attacks. And sees psychiatry.  Has a lot of stress.      Dizziness, not all the time, but sometimes feels like she can't take a shower.  __________________________________________________________    Problem List:  Patient Active Problem List   Diagnosis   ??? Systemic lupus erythematosus (CMS-HCC)   ??? Migraine headache with aura   ??? History of lymphoma   ??? Mitral valve prolapse   ??? Mixed hyperlipidemia   ???  Pain in joint, lower leg   ??? Reflux esophagitis   ??? Sicca syndrome (CMS-HCC)   ??? Urge incontinence   ??? Allergic rhinitis   ??? Food allergy   ??? Hypothyroid   ??? Mixed stress and urge urinary incontinence   ??? Anxiety   ??? Essential hypertension   ??? Chronic low back pain   ??? Visual complaint   ??? Polypharmacy   ??? Vitamin B12 deficiency   ??? Vitamin D deficiency   ??? Tension headache   ??? Left arm pain   ??? Skin lesion of face   ??? Chronic pain   ??? Fibromyalgia   ??? Inflammatory polyarthropathy (CMS-HCC)   ??? Long-term use of high-risk medication   ??? Lymphoma (CMS-HCC)   ??? Adjustment disorder with anxious mood   ??? NSAID long-term use   ??? Sjogren's syndrome with keratoconjunctivitis sicca (CMS-HCC)   ??? Chronic migraine   ??? Breast pain   ??? Long term prescription benzodiazepine use   ??? History of recent stressful life event   ??? 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 Hyperopia with astigmatism and presbyopia, bilateral   ??? Age-related nuclear cataract of both eyes   ??? History of psychological trauma, presenting hazards to health   ??? MDD (major depressive disorder), recurrent, in full remission (CMS-HCC)       Medications:  Reviewed in EPIC  __________________________________________________________    Physical Exam:   Vital Signs:  Vitals:    12/21/19 1009   BP: 136/86   Pulse: 78   Resp: 16   Temp: 36.6 ??C (97.9 ??F)   TempSrc: Temporal   SpO2: 97%   Weight: 73.8 kg (162 lb 9.6 oz)   Height: 157.5 cm (5' 2)       Gen: Well appearing, NAD  CV: RRR, no murmurs  Lungs: CTA B  Ext: No edema       Medication adherence and barriers to the treatment plan have been addressed. Opportunities to optimize healthy behaviors have been discussed. Patient / caregiver voiced understanding.

## 2019-12-30 ENCOUNTER — Institutional Professional Consult (permissible substitution): Admit: 2019-12-30 | Discharge: 2019-12-31 | Payer: MEDICARE

## 2019-12-30 DIAGNOSIS — G43119 Migraine with aura, intractable, without status migrainosus: Principal | ICD-10-CM

## 2019-12-30 MED ADMIN — galcanezumab-gnlm (EMGALITY) injection 120 mg: 120 mg | SUBCUTANEOUS | @ 18:00:00 | Stop: 2019-12-30

## 2019-12-30 NOTE — Unmapped (Signed)
EMGALITY  INJECTION  WAS GIVEN TO THE PT ON THE LEFT THIGH. PT TOLERATED WELL.  Nicolette Bang

## 2020-01-01 DIAGNOSIS — M3219 Other organ or system involvement in systemic lupus erythematosus: Principal | ICD-10-CM

## 2020-01-05 DIAGNOSIS — G894 Chronic pain syndrome: Principal | ICD-10-CM

## 2020-01-05 DIAGNOSIS — M797 Fibromyalgia: Principal | ICD-10-CM

## 2020-01-05 DIAGNOSIS — F3341 Major depressive disorder, recurrent, in partial remission: Principal | ICD-10-CM

## 2020-01-05 DIAGNOSIS — Z9149 Other personal history of psychological trauma, not elsewhere classified: Principal | ICD-10-CM

## 2020-01-05 MED ORDER — TRAZODONE 50 MG TABLET
ORAL_TABLET | 1 refills | 0.00000 days | Status: CP
Start: 2020-01-05 — End: ?

## 2020-01-06 ENCOUNTER — Ambulatory Visit: Admit: 2020-01-06 | Discharge: 2020-01-07 | Payer: MEDICARE

## 2020-01-06 LAB — THYROID STIMULATING HORMONE: Thyrotropin:ACnc:Pt:Ser/Plas:Qn:: 5.075 — ABNORMAL HIGH

## 2020-01-06 LAB — ESTIMATED AVERAGE GLUCOSE: Estimated average glucose:MCnc:Pt:Bld:Qn:Estimated from glycated hemoglobin: 108

## 2020-01-06 LAB — C4 COMPLEMENT: Complement C4:MCnc:Pt:Ser/Plas:Qn:: 28.7

## 2020-01-06 MED ADMIN — diphenhydrAMINE (BENADRYL) injection 50 mg: 50 mg | INTRAVENOUS | @ 14:00:00 | Stop: 2020-01-06

## 2020-01-06 MED ADMIN — belimumab (BENLYSTA) 10 mg/kg = 728 mg in sodium chloride (NS) 250 mL IVPB: 10 mg/kg | INTRAVENOUS | @ 15:00:00 | Stop: 2020-01-06

## 2020-01-06 MED ADMIN — ondansetron (ZOFRAN) tablet 8 mg: 8 mg | ORAL | @ 14:00:00 | Stop: 2020-01-06

## 2020-01-06 NOTE — Unmapped (Signed)
0945??Patient in today for Benlysta infusion. Patient has no s/s of infection. No issues from the last infusion.??  1048??Benlysta started ,patient instructed to use call bell /call nurse for any s/s of unsual symptoms during infusion. Patient educated on possible s/s of reaction,such as chestpain ,itching,shortness of breath lightheadedness and any kind of discomfort. Patient verbalized understanding...  1208??Infusion completed and well tolerated.  1220??Benlysta 728??mg/250??ml??NS infused over 1hr and??20??min.??per protocol . Pt alert and oriented, offered no complaints during infusion. IV flushed with 10ml NS . Patient discharged in stable condition

## 2020-01-11 ENCOUNTER — Ambulatory Visit: Admit: 2020-01-11 | Discharge: 2020-01-12 | Payer: MEDICARE

## 2020-01-11 DIAGNOSIS — R5382 Chronic fatigue, unspecified: Secondary | ICD-10-CM

## 2020-01-11 DIAGNOSIS — M064 Inflammatory polyarthropathy: Principal | ICD-10-CM

## 2020-01-11 DIAGNOSIS — M797 Fibromyalgia: Secondary | ICD-10-CM

## 2020-01-11 DIAGNOSIS — H2513 Age-related nuclear cataract, bilateral: Principal | ICD-10-CM

## 2020-01-11 DIAGNOSIS — M3501 Sicca syndrome with keratoconjunctivitis: Principal | ICD-10-CM

## 2020-01-11 DIAGNOSIS — M3219 Other organ or system involvement in systemic lupus erythematosus: Principal | ICD-10-CM

## 2020-01-11 DIAGNOSIS — E039 Hypothyroidism, unspecified: Principal | ICD-10-CM

## 2020-01-11 DIAGNOSIS — H16223 Keratoconjunctivitis sicca, not specified as Sjogren's, bilateral: Principal | ICD-10-CM

## 2020-01-11 MED ORDER — LEVOTHYROXINE 50 MCG TABLET
ORAL_TABLET | 3 refills | 0.00000 days | Status: CP
Start: 2020-01-11 — End: ?

## 2020-01-11 MED ORDER — LOTEPREDNOL ETABONATE 0.5 % EYE DROPS,SUSPENSION
Freq: Four times a day (QID) | OPHTHALMIC | 0 refills | 25.00000 days | Status: CP
Start: 2020-01-11 — End: 2020-02-05

## 2020-01-11 NOTE — Unmapped (Signed)
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   States she could actually see better. Now it feels irritated and vision is not as clear.   Left eye > right eye tearing, itching, burning. Hurt around my sockets    Soothe PRN both eyes (at least once a day, sometimes twice a day). Warm, moist,    Compresses every day.  Her insurance denied the Lotemax ointment and   now her eyes and eyelids are still inflamed.  ??  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        Pt really needs to be on this medication as she needs a preservative free       Formulation that will not elevate IOP.   ??  # Sjogren's K Sicca-- some improvement with amniotic membrane Tx, but still needs         Lotemax as part of anti-inflammatory Treatment.     Trial of Bandage CL L eye only.  Pt will be seeing Dr Marin Shutter for Pre OP cataract surgery in 4 weeks    L eye is worse than the Right.    She will wear this bandage CL x 2 weeks and then remove for pre-OP measurements.    Alcon 8.6 BC  Night & Day.

## 2020-01-20 MED FILL — EMGALITY PEN 120 MG/ML SUBCUTANEOUS PEN INJECTOR: SUBCUTANEOUS | 30 days supply | Qty: 1 | Fill #5

## 2020-01-20 MED FILL — EMGALITY PEN 120 MG/ML SUBCUTANEOUS PEN INJECTOR: 30 days supply | Qty: 1 | Fill #5 | Status: AC

## 2020-01-24 DIAGNOSIS — M3219 Other organ or system involvement in systemic lupus erythematosus: Principal | ICD-10-CM

## 2020-01-24 NOTE — Unmapped (Signed)
Pre-procedure call. Left message regarding appointment time, arrival time, driver necessary, and to call [phone] if any questions/concerns/need to reschedule. Left message to reschedule if experiencing fever, chills, severe fatigue, muscle aches, runny nose, sore throat, recent loss of taste or smell, new or worsening cough, SOB, headache, nausea/vomiting, abdominal pain, or diarrhea. Also please reschedule if you have been in close contact with a COVID-19 positive person in the last 21 days or have tested positive for COVID-19 in the last 21 days.

## 2020-01-25 ENCOUNTER — Institutional Professional Consult (permissible substitution): Admit: 2020-01-25 | Discharge: 2020-01-26 | Payer: MEDICARE

## 2020-01-25 NOTE — Unmapped (Signed)
I gave Olivia Stout her Emgality injection in her left leg. She tolerated it well and was made a future appointment for 28 days.

## 2020-01-25 NOTE — Unmapped (Addendum)
Procedure: right Sacroiliac Joint Injections under Fluoroscopic Guidance    Performed By: Dr. Comer Locket  Assistant: Overton Mam, MD    Type of Anesthesia: Local and 1mg  versed IV    Brief History:  69 year old female with PMHx significant for HTN, lymphoma, lupus, and MDD, scoliosis at L2-L3 and a prior L4-L5 fusion, chronic lower back pain. Her exam is significant for +right FABER and R SI joint tenderness. She is here for right SI joint injection. Patient is exceedingly anxious.     Informed consent was obtained and potential risks discussed including, but not limited to: bleeding, bruising, severe allergic reaction to components of the injection materials, infection superficial, deep, and abscess, nerve damage, inability to place the needle properly, hemarthrosis, the possibility of no benefit (pain relief) derived from the injection, or in rare occasions worsening of pain. Questions were answered to the patient's satisfaction and the patient wishes to proceed.  Alternative options for treatment have previously been discussed and explored with the patient.    The patient denies taking antiplatelet or anticoagulation medications and has a driver today.    PROCEDURE:      Prior to entry into the fluoroscopy room, the patient was marked on the right.  The patient was placed prone on the procedure table and all pressure points padded. Standard ASA monitors were applied. A timeout protocol was performed per [facility]. Hand washing with antibacterial soap and water and/or the use of alcohol based cleanser was performed. Proper protective gear was worn by the physician including a mask, scrub cap, and sterile gloves. A sterile prep with ChloraPrep x2 was performed and a sterile drape was applied.      Using an AP fluoroscopic view the right sacroliac joint was visualized. The fluoroscopic beam was then oriented contralateral oblique to open up the view of the sacroiliac joint. Using 25-gauge needle, using 1% Lidocaine plain a skin wheal was raised and deeper tissues infiltrated. Using a 22-gauge 3.5 in spinal needle the target was approached using intermittent fluoroscopic guidance. Then 4 ml of a mixture of 3 mL 0.25% bupivicaine + 1ml of 40 mg kenalog was injected after negative aspiration. The needle was removed and a bandage was placed over the puncture site.     Total corticosteroid dose was 40 mg of Kenalog    Total fluoro time is 6 seconds. Pre-procedure pain score was 7 /10. Post-procedure pain score is 6/10.      There were no complications encountered during this procedure    The patient did tolerate the procedure well. Vital signs were stable. Sensory and motor exam was unchanged from baseline. The patient was observed for 15 minutes, and was ambulating without assistance upon leaving the clinic.    Disposition:   F/u in clinic on 02/28/20

## 2020-01-26 ENCOUNTER — Ambulatory Visit: Admit: 2020-01-26 | Discharge: 2020-01-27 | Payer: MEDICARE

## 2020-01-26 DIAGNOSIS — K219 Gastro-esophageal reflux disease without esophagitis: Principal | ICD-10-CM

## 2020-01-26 DIAGNOSIS — Z791 Long term (current) use of non-steroidal anti-inflammatories (NSAID): Principal | ICD-10-CM

## 2020-01-26 DIAGNOSIS — M533 Sacrococcygeal disorders, not elsewhere classified: Principal | ICD-10-CM

## 2020-01-26 DIAGNOSIS — Z880 Allergy status to penicillin: Principal | ICD-10-CM

## 2020-01-26 DIAGNOSIS — C8590 Non-Hodgkin lymphoma, unspecified, unspecified site: Principal | ICD-10-CM

## 2020-01-26 DIAGNOSIS — Z981 Arthrodesis status: Principal | ICD-10-CM

## 2020-01-26 DIAGNOSIS — K589 Irritable bowel syndrome without diarrhea: Principal | ICD-10-CM

## 2020-01-26 DIAGNOSIS — I10 Essential (primary) hypertension: Principal | ICD-10-CM

## 2020-01-26 DIAGNOSIS — N3946 Mixed incontinence: Principal | ICD-10-CM

## 2020-01-26 DIAGNOSIS — Z79890 Hormone replacement therapy (postmenopausal): Principal | ICD-10-CM

## 2020-01-26 DIAGNOSIS — E7800 Pure hypercholesterolemia, unspecified: Principal | ICD-10-CM

## 2020-01-26 DIAGNOSIS — Z882 Allergy status to sulfonamides status: Principal | ICD-10-CM

## 2020-01-26 DIAGNOSIS — Z79899 Other long term (current) drug therapy: Principal | ICD-10-CM

## 2020-01-26 DIAGNOSIS — M461 Sacroiliitis, not elsewhere classified: Principal | ICD-10-CM

## 2020-01-26 DIAGNOSIS — M797 Fibromyalgia: Principal | ICD-10-CM

## 2020-01-26 DIAGNOSIS — E039 Hypothyroidism, unspecified: Principal | ICD-10-CM

## 2020-01-26 DIAGNOSIS — M545 Low back pain, unspecified: Principal | ICD-10-CM

## 2020-01-26 DIAGNOSIS — G8929 Other chronic pain: Principal | ICD-10-CM

## 2020-01-26 DIAGNOSIS — F419 Anxiety disorder, unspecified: Principal | ICD-10-CM

## 2020-01-26 DIAGNOSIS — M4186 Other forms of scoliosis, lumbar region: Principal | ICD-10-CM

## 2020-01-26 DIAGNOSIS — M1990 Unspecified osteoarthritis, unspecified site: Principal | ICD-10-CM

## 2020-01-26 DIAGNOSIS — M329 Systemic lupus erythematosus, unspecified: Principal | ICD-10-CM

## 2020-01-26 DIAGNOSIS — F329 Major depressive disorder, single episode, unspecified: Principal | ICD-10-CM

## 2020-01-26 MED ADMIN — lidocaine (XYLOCAINE) 5 mg/mL (0.5 %) injection: INTRAMUSCULAR | @ 13:00:00 | Stop: 2020-01-26

## 2020-01-26 MED ADMIN — midazolam (VERSED) injection: INTRAVENOUS | @ 13:00:00 | Stop: 2020-01-26

## 2020-01-26 MED ADMIN — bupivacaine (PF) (MARCAINE) 0.25 % (2.5 mg/mL) injection (PF): @ 13:00:00 | Stop: 2020-01-26

## 2020-01-26 MED ADMIN — triamcinolone acetonide (KENALOG-40) injection: INTRA_ARTICULAR | @ 13:00:00 | Stop: 2020-01-26

## 2020-02-01 MED ORDER — TRAMADOL 50 MG TABLET
ORAL_TABLET | Freq: Four times a day (QID) | ORAL | 1 refills | 5.00000 days | Status: CP | PRN
Start: 2020-02-01 — End: 2020-03-10

## 2020-02-02 ENCOUNTER — Ambulatory Visit: Admit: 2020-02-02 | Discharge: 2020-02-03 | Payer: MEDICARE

## 2020-02-02 DIAGNOSIS — M3219 Other organ or system involvement in systemic lupus erythematosus: Principal | ICD-10-CM

## 2020-02-02 MED ADMIN — ondansetron (ZOFRAN) tablet 8 mg: 8 mg | ORAL | @ 16:00:00 | Stop: 2020-02-02

## 2020-02-02 MED ADMIN — belimumab (BENLYSTA) 10 mg/kg = 718.4 mg in sodium chloride (NS) 250 mL IVPB: 10 mg/kg | INTRAVENOUS | @ 16:00:00 | Stop: 2020-02-02

## 2020-02-02 MED ADMIN — diphenhydrAMINE (BENADRYL) injection 50 mg: 50 mg | INTRAVENOUS | @ 16:00:00 | Stop: 2020-02-02

## 2020-02-02 NOTE — Unmapped (Signed)
1030??Patient in today for Benlysta infusion. Patient has no s/s of infection. No issues from the last infusion.??  1117??Benlysta started ,patient instructed to use call bell /call nurse for any s/s of unsual symptoms during infusion. Patient educated on possible s/s of reaction,such as chestpain ,itching,shortness of breath lightheadedness and any kind of discomfort. Patient verbalized understanding...  1240??Infusion completed and well tolerated.  1300??Benlysta 718.4??mg/250??ml??NS infused over 1hr and??23??min.??per protocol . Pt alert and oriented, offered no complaints during infusion. IV flushed with 10ml NS . Patient discharged in stable condition

## 2020-02-09 ENCOUNTER — Ambulatory Visit: Admit: 2020-02-09 | Discharge: 2020-02-10 | Payer: MEDICARE

## 2020-02-09 DIAGNOSIS — H2513 Age-related nuclear cataract, bilateral: Principal | ICD-10-CM

## 2020-02-09 NOTE — Unmapped (Signed)
Patient is being seen in consultation at the request of Dr.Friedland for evaluation of cataract    1. Cataracts, 2NS OU  -- Difficulty with:glare, blur, driving, night driving   -- Lens: OD:  5.3IR  OS: 2NS  -- Pupil dilation: ok  -- Pseudoexfoliation?: no  -- Flomax?: no  -- Trauma?: no  -- BCVA?: 20/30 and 20/40  Visual Acuity (Snellen - Linear)       Right Left    Dist cc 20/30 -2 20/40 -2    Dist ph cc NI NI    Near cc J2 J2    Correction: Glasses         -- Worse with BAT?: yes  -- Assessment:   - Visually significant cataract, patient desires surgery in an effort to improve vision   -- Plan:     - CE/IOL left eye then right eye 2/22 and 3/8 respectively     - Pt was educated on toric vs premiums but not indicated or desired   - Correction for distance vision.    Discussed with patient surgical plan and patient made aware of r/b/a including risk of vision loss and need for additional surgery. Patient understands surgery is intended / anticipated to improve the vision but this is never guaranteed. Understands possible need for distance vision glasses after surgery. Understands need for reading glasses after surgery. Pt aware of the need for good compliance with follow-up visits and medications in the post operative period. Plan to proceed with surgery.  Pt aware that cataract surgery will NOT change the symptoms of dry eye. She requests a new provider for dry eye management and I have recommended she see my colleague Dr. Georgeann Oppenheim prior to surgery to optimize her ocular surface and overall comfort. She expressed good understanding of the limitation to cataract with regard to the dry eye signs/symptoms.     OD: Eyehance DIBOO 22, [id number] 21.5/20.5   OS: Eyehance DIBOO 21.5, [id number] 21.5/20.5    2. 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 Eyelid Inflammatory response and Sjogren's K Sicca  - c/w management by Dr. Julaine Hua - pt requests new provider - refer Dr. Georgeann Oppenheim per her request     4. H/o plaquenil use with prior visit which suggests possible early change. LTFU since 2019 from Dr. Sheilah Mins clinic   - needs updated retina evaluation prior to CE, f/u Dr. Waunita Schooner next available, will move cataract surgery (Feb/march) as needed to accommodate this as cataracts are not urgent

## 2020-02-14 MED FILL — EMGALITY PEN 120 MG/ML SUBCUTANEOUS PEN INJECTOR: 30 days supply | Qty: 1 | Fill #6 | Status: AC

## 2020-02-14 MED FILL — EMGALITY PEN 120 MG/ML SUBCUTANEOUS PEN INJECTOR: SUBCUTANEOUS | 30 days supply | Qty: 1 | Fill #6

## 2020-02-22 ENCOUNTER — Institutional Professional Consult (permissible substitution): Admit: 2020-02-22 | Discharge: 2020-02-23 | Payer: MEDICARE

## 2020-02-22 DIAGNOSIS — G43809 Other migraine, not intractable, without status migrainosus: Principal | ICD-10-CM

## 2020-02-22 MED ADMIN — galcanezumab-gnlm (EMGALITY) injection 120 mg: 120 mg | SUBCUTANEOUS | @ 16:00:00

## 2020-02-24 DIAGNOSIS — M3219 Other organ or system involvement in systemic lupus erythematosus: Principal | ICD-10-CM

## 2020-02-24 NOTE — Unmapped (Signed)
This patient has been disenrolled from the [facility] specialty pharmacy services due to patient does not want to self inject and never started medication.Camillo Flaming  [facility] Specialty Pharmacist

## 2020-02-28 ENCOUNTER — Ambulatory Visit: Admit: 2020-02-28 | Discharge: 2020-02-29 | Payer: MEDICARE

## 2020-02-28 DIAGNOSIS — M461 Sacroiliitis, not elsewhere classified: Principal | ICD-10-CM

## 2020-03-01 ENCOUNTER — Ambulatory Visit: Admit: 2020-03-01 | Discharge: 2020-03-02 | Payer: MEDICARE

## 2020-03-01 DIAGNOSIS — M3219 Other organ or system involvement in systemic lupus erythematosus: Principal | ICD-10-CM

## 2020-03-10 MED ORDER — TRAMADOL 50 MG TABLET
ORAL_TABLET | Freq: Four times a day (QID) | ORAL | 1 refills | 5.00000 days | Status: CP | PRN
Start: 2020-03-10 — End: ?

## 2020-03-13 DIAGNOSIS — G43109 Migraine with aura, not intractable, without status migrainosus: Principal | ICD-10-CM

## 2020-03-13 MED ORDER — EMGALITY PEN 120 MG/ML SUBCUTANEOUS PEN INJECTOR
SUBCUTANEOUS | 11 refills | 30.00000 days | Status: CP
Start: 2020-03-13 — End: 2021-03-13
  Filled 2020-03-15: qty 1, 30d supply, fill #0

## 2020-03-14 DIAGNOSIS — J329 Chronic sinusitis, unspecified: Principal | ICD-10-CM

## 2020-03-14 MED ORDER — LEVOFLOXACIN 500 MG TABLET
ORAL_TABLET | Freq: Every day | ORAL | 0 refills | 6.00000 days | Status: CP
Start: 2020-03-14 — End: 2020-03-24

## 2020-03-15 MED FILL — EMGALITY PEN 120 MG/ML SUBCUTANEOUS PEN INJECTOR: 30 days supply | Qty: 1 | Fill #0 | Status: AC

## 2020-03-23 ENCOUNTER — Encounter: Payer: Self-pay | Admitting: Family Medicine

## 2020-03-23 ENCOUNTER — Other Ambulatory Visit: Payer: Self-pay

## 2020-03-23 ENCOUNTER — Inpatient Hospital Stay
Admission: EM | Admit: 2020-03-23 | Discharge: 2020-04-03 | DRG: 177 | Disposition: A | Discharge status: Home Health | Payer: Medicare HMO | Attending: Internal Medicine | Admitting: Internal Medicine

## 2020-03-23 ENCOUNTER — Emergency Department: Payer: Medicare HMO

## 2020-03-23 DIAGNOSIS — J1282 Pneumonia due to coronavirus disease 2019: Secondary | ICD-10-CM | POA: Diagnosis present

## 2020-03-23 DIAGNOSIS — E039 Hypothyroidism, unspecified: Secondary | ICD-10-CM | POA: Diagnosis present

## 2020-03-23 DIAGNOSIS — M461 Sacroiliitis, not elsewhere classified: Secondary | ICD-10-CM | POA: Diagnosis present

## 2020-03-23 DIAGNOSIS — U071 COVID-19: Secondary | ICD-10-CM | POA: Diagnosis present

## 2020-03-23 DIAGNOSIS — J9601 Acute respiratory failure with hypoxia: Secondary | ICD-10-CM | POA: Diagnosis present

## 2020-03-23 DIAGNOSIS — R531 Weakness: Secondary | ICD-10-CM | POA: Diagnosis present

## 2020-03-23 DIAGNOSIS — M549 Dorsalgia, unspecified: Secondary | ICD-10-CM | POA: Diagnosis present

## 2020-03-23 DIAGNOSIS — J309 Allergic rhinitis, unspecified: Secondary | ICD-10-CM

## 2020-03-23 DIAGNOSIS — F32A Depression, unspecified: Secondary | ICD-10-CM | POA: Diagnosis present

## 2020-03-23 DIAGNOSIS — K219 Gastro-esophageal reflux disease without esophagitis: Secondary | ICD-10-CM | POA: Diagnosis present

## 2020-03-23 DIAGNOSIS — G8929 Other chronic pain: Secondary | ICD-10-CM | POA: Diagnosis present

## 2020-03-23 DIAGNOSIS — Z9221 Personal history of antineoplastic chemotherapy: Secondary | ICD-10-CM

## 2020-03-23 DIAGNOSIS — Z79890 Hormone replacement therapy (postmenopausal): Secondary | ICD-10-CM | POA: Diagnosis not present

## 2020-03-23 DIAGNOSIS — R0902 Hypoxemia: Secondary | ICD-10-CM

## 2020-03-23 DIAGNOSIS — F419 Anxiety disorder, unspecified: Secondary | ICD-10-CM | POA: Diagnosis present

## 2020-03-23 DIAGNOSIS — Z79899 Other long term (current) drug therapy: Secondary | ICD-10-CM | POA: Diagnosis not present

## 2020-03-23 DIAGNOSIS — Z8572 Personal history of non-Hodgkin lymphomas: Secondary | ICD-10-CM | POA: Diagnosis not present

## 2020-03-23 DIAGNOSIS — M329 Systemic lupus erythematosus, unspecified: Secondary | ICD-10-CM | POA: Diagnosis present

## 2020-03-23 DIAGNOSIS — Z8615 Personal history of latent tuberculosis infection: Secondary | ICD-10-CM | POA: Diagnosis not present

## 2020-03-23 LAB — BASIC METABOLIC PANEL
Anion gap: 17 — ABNORMAL HIGH (ref 5–15)
BUN: 25 mg/dL — ABNORMAL HIGH (ref 8–23)
CO2: 26 mmol/L (ref 22–32)
Calcium: 9.7 mg/dL (ref 8.9–10.3)
Chloride: 95 mmol/L — ABNORMAL LOW (ref 98–111)
Creatinine, Ser: 0.97 mg/dL (ref 0.44–1.00)
GFR, Estimated: >60 mL/min (ref >60)
Glucose, Bld: 106 mg/dL — ABNORMAL HIGH (ref 70–99)
Potassium: 4.2 mmol/L (ref 3.5–5.1)
Sodium: 138 mmol/L (ref 135–145)

## 2020-03-23 LAB — CBC
HCT: 41.5 % (ref 36.0–46.0)
HCT: 39.6 % (ref 36.0–46.0)
Hemoglobin: 14.3 g/dL (ref 12.0–15.0)
Hemoglobin: 13.3 g/dL (ref 12.0–15.0)
MCH: 32.9 pg (ref 26.0–34.0)
MCH: 32.4 pg (ref 26.0–34.0)
MCHC: 34.5 g/dL (ref 30.0–36.0)
MCHC: 33.6 g/dL (ref 30.0–36.0)
MCV: 95.6 fL (ref 80.0–100.0)
MCV: 96.4 fL (ref 80.0–100.0)
Platelets: 272 K/uL (ref 150–400)
Platelets: 256 10*3/uL (ref 150–400)
RBC: 4.34 MIL/uL (ref 3.87–5.11)
RBC: 4.11 MIL/uL (ref 3.87–5.11)
RDW: 12.2 % (ref 11.5–15.5)
RDW: 12.2 % (ref 11.5–15.5)
WBC: 7.8 K/uL (ref 4.0–10.5)
WBC: 5.8 10*3/uL (ref 4.0–10.5)
nRBC: 0 % (ref 0.0–0.2)
nRBC: 0 % (ref 0.0–0.2)

## 2020-03-23 LAB — HEPATIC FUNCTION PANEL
ALT: 40 U/L (ref 0–44)
AST: 41 U/L (ref 15–41)
Albumin: 3.2 g/dL — ABNORMAL LOW (ref 3.5–5.0)
Alkaline Phosphatase: 185 U/L — ABNORMAL HIGH (ref 38–126)
Bilirubin, Direct: 0.1 mg/dL (ref 0.0–0.2)
Indirect Bilirubin: 0.9 mg/dL (ref 0.3–0.9)
Total Bilirubin: 1 mg/dL (ref 0.3–1.2)
Total Protein: 7.3 g/dL (ref 6.5–8.1)

## 2020-03-23 LAB — TROPONIN I (HIGH SENSITIVITY)
Troponin I (High Sensitivity): 22 ng/L — ABNORMAL HIGH (ref <18)
Troponin I (High Sensitivity): 23 ng/L — ABNORMAL HIGH (ref <18)

## 2020-03-23 LAB — CREATININE, SERUM
Creatinine, Ser: 0.74 mg/dL (ref 0.44–1.00)
GFR, Estimated: >60 mL/min (ref >60)

## 2020-03-23 LAB — POC SARS CORONAVIRUS 2 AG -  ED
SARS Coronavirus 2 Ag: POSITIVE — AB
SARS Coronavirus 2 Ag: POSITIVE — AB

## 2020-03-23 LAB — FIBRIN DERIVATIVES D-DIMER (ARMC ONLY): Fibrin derivatives D-dimer (ARMC): 2531.67 ng/mL (FEU) — ABNORMAL HIGH (ref 0.00–499.00)

## 2020-03-23 LAB — FERRITIN: Ferritin: 1042 ng/mL — ABNORMAL HIGH (ref 11–307)

## 2020-03-23 MED ORDER — ZINC SULFATE 220 (50 ZN) MG PO CAPS
220.0000 mg | ORAL_CAPSULE | Freq: Every day | ORAL | Status: DC
  Administered 2020-03-23 – 2020-04-03 (×12): 220 mg via ORAL
  Filled 2020-03-23 (×12): qty 1

## 2020-03-23 MED ORDER — HYDROCODONE-ACETAMINOPHEN 5-325 MG PO TABS: 1.0000 | ORAL_TABLET | ORAL | Status: DC | PRN

## 2020-03-23 MED ORDER — SODIUM CHLORIDE 0.9 % IV SOLN: Freq: Once | INTRAVENOUS | Status: AC

## 2020-03-23 MED ORDER — AZELASTINE HCL 0.1 % NA SOLN
1.0000 | Freq: Two times a day (BID) | NASAL | Status: DC
  Administered 2020-03-24 – 2020-04-02 (×20): 1 via NASAL
  Filled 2020-03-23 (×2): qty 30

## 2020-03-23 MED ORDER — LEVOTHYROXINE SODIUM 50 MCG PO TABS
50.0000 ug | ORAL_TABLET | Freq: Every day | ORAL | Status: DC
  Administered 2020-03-24 – 2020-04-03 (×10): 50 ug via ORAL
  Filled 2020-03-23 (×10): qty 1

## 2020-03-23 MED ORDER — ASCORBIC ACID 500 MG PO TABS
500.0000 mg | ORAL_TABLET | Freq: Every day | ORAL | Status: DC
  Administered 2020-03-23 – 2020-04-03 (×12): 500 mg via ORAL
  Filled 2020-03-23 (×12): qty 1

## 2020-03-23 MED ORDER — GUAIFENESIN-DM 100-10 MG/5ML PO SYRP: 10.0000 mL | ORAL_SOLUTION | ORAL | Status: DC | PRN

## 2020-03-23 MED ORDER — FLUTICASONE PROPIONATE 50 MCG/ACT NA SUSP
1.0000 | Freq: Every day | NASAL | Status: DC
  Administered 2020-03-23 – 2020-04-02 (×10): 1 via NASAL
  Filled 2020-03-23: qty 16

## 2020-03-23 MED ORDER — CLONAZEPAM 1 MG PO TABS
1.0000 mg | ORAL_TABLET | Freq: Every evening | ORAL | Status: DC | PRN
  Administered 2020-03-25 – 2020-03-27 (×3): 1 mg via ORAL
  Filled 2020-03-23: qty 2
  Filled 2020-03-23 (×2): qty 1

## 2020-03-23 MED ORDER — SODIUM CHLORIDE 0.9 % IV SOLN
100.0000 mg | Freq: Every day | INTRAVENOUS | Status: AC
  Administered 2020-03-24 – 2020-03-27 (×4): 100 mg via INTRAVENOUS
  Filled 2020-03-23: qty 20
  Filled 2020-03-23 (×2): qty 100
  Filled 2020-03-23 (×3): qty 20

## 2020-03-23 MED ORDER — METHYLPREDNISOLONE SODIUM SUCC 125 MG IJ SOLR
1.0000 mg/kg | Freq: Two times a day (BID) | INTRAMUSCULAR | Status: DC
  Administered 2020-03-23 – 2020-03-28 (×10): 70 mg via INTRAVENOUS
  Filled 2020-03-23 (×10): qty 2

## 2020-03-23 MED ORDER — ALBUTEROL SULFATE HFA 108 (90 BASE) MCG/ACT IN AERS
2.0000 | INHALATION_SPRAY | Freq: Four times a day (QID) | RESPIRATORY_TRACT | Status: DC
  Administered 2020-03-23 – 2020-03-24 (×3): 2 via RESPIRATORY_TRACT
  Filled 2020-03-23: qty 6.7

## 2020-03-23 MED ORDER — ACETAMINOPHEN 325 MG PO TABS
650.0000 mg | ORAL_TABLET | Freq: Four times a day (QID) | ORAL | Status: DC | PRN
  Administered 2020-03-24 – 2020-03-27 (×4): 650 mg via ORAL
  Filled 2020-03-23 (×4): qty 2

## 2020-03-23 MED ORDER — LORATADINE 10 MG PO TABS
10.0000 mg | ORAL_TABLET | Freq: Every day | ORAL | Status: DC
  Administered 2020-03-23 – 2020-04-03 (×12): 10 mg via ORAL
  Filled 2020-03-23 (×12): qty 1

## 2020-03-23 MED ORDER — POLYETHYLENE GLYCOL 3350 17 G PO PACK: 17.0000 g | PACK | Freq: Every day | ORAL | Status: DC | PRN

## 2020-03-23 MED ORDER — ONDANSETRON HCL 4 MG PO TABS
4.0000 mg | ORAL_TABLET | Freq: Four times a day (QID) | ORAL | Status: DC | PRN
  Filled 2020-03-23: qty 1

## 2020-03-23 MED ORDER — ENOXAPARIN SODIUM 40 MG/0.4ML ~~LOC~~ SOLN
40.0000 mg | SUBCUTANEOUS | Status: DC
  Administered 2020-03-23 – 2020-04-02 (×11): 40 mg via SUBCUTANEOUS
  Filled 2020-03-23 (×12): qty 0.4

## 2020-03-23 MED ORDER — HYDROCOD POLST-CPM POLST ER 10-8 MG/5ML PO SUER: 5.0000 mL | Freq: Two times a day (BID) | ORAL | Status: DC | PRN

## 2020-03-23 MED ORDER — SODIUM CHLORIDE 0.9 % IV SOLN
200.0000 mg | Freq: Once | INTRAVENOUS | Status: AC
  Administered 2020-03-23: 200 mg via INTRAVENOUS
  Filled 2020-03-23: qty 40

## 2020-03-23 MED ORDER — DULOXETINE HCL 30 MG PO CPEP
60.0000 mg | ORAL_CAPSULE | Freq: Every day | ORAL | Status: DC
  Administered 2020-03-24 – 2020-04-03 (×11): 60 mg via ORAL
  Filled 2020-03-23 (×9): qty 2
  Filled 2020-03-23: qty 1
  Filled 2020-03-23: qty 2

## 2020-03-23 MED ORDER — ONDANSETRON HCL 4 MG/2ML IJ SOLN
4.0000 mg | Freq: Four times a day (QID) | INTRAMUSCULAR | Status: DC | PRN
  Administered 2020-03-23 – 2020-04-02 (×9): 4 mg via INTRAVENOUS
  Filled 2020-03-23 (×9): qty 2

## 2020-03-23 MED ORDER — SODIUM CHLORIDE 0.9 % IV SOLN: INTRAVENOUS | Status: AC

## 2020-03-23 NOTE — ED Provider Notes (Signed)
[facility] Emergency Department Provider Note   ____________________________________________   Event Date/Time   First MD Initiated Contact with Patient 03/23/20 1551     (approximate)  I have reviewed the triage vital signs and the nursing notes.   HISTORY  Chief Complaint Weakness   HP Tara Cross is a 69 y.o. female who reports she is feeling weak and just not feeling well the last several days. She has not wanted to eat anything or drink anything much either. She has some chest congestion and shortness of breath. She does not really have any chest tightness. On arrival her O2 sats were 86 and did not improved over 90 until she was on 4 L of oxygen per nasal cannula. Patient is not running a fever.    Patient with a history of lupus recurrent sinusitis sacroiliitis hypothyroidism intractable migraines and cataracts.      Prior to Admission medications   Not on File  Current Outpatient Medications from visit to [facility] on the sixth of this month Medication Sig Dispense Refill   acetaminophen (TYLENOL) 500 MG tablet Take 500 mg by mouth every six (6) hours as needed for pain.   albuterol (PROVENTIL HFA;VENTOLIN HFA) 90 mcg/actuation inhaler Inhale 2 puffs. As needed   ammonium lactate (AMLACTIN) 12 % cream Apply topically once daily. 400 g 6   ammonium lactate (LAC-HYDRIN) 12 % lotion As needed   azelastine (ASTELIN) 137 mcg (0.1 %) nasal spray 2 sprays by Each Nare route Two (2) times a day. 30 mL 11   belimumab (BENLYSTA) 400 mg SolR Infuse 720 mg into a venous catheter every thirty (30) days.   cholecalciferol, vitamin D3, (VITAMIN D3) 1,000 unit capsule Take 1,000 Units by mouth daily.   clonazePAM (KLONOPIN) 1 MG tablet TAKE 1 TABLET BY MOUTH AT BEDTIME AS NEEDED FOR SLEEP 30 tablet 0   DULoxetine (CYMBALTA) 60 MG capsule TAKE 1 CAPSULE BY MOUTH ONCE DAILY 90 capsule 0   empty container Misc Use as directed 1 each PRN   EPINEPHrine  (EPIPEN) 0.3 mg/0.3 mL injection Inject 0.3 mL (0.3 mg total) into the muscle Take as directed. Note:carry at all times 1 each 3   estradiol (ESTRACE) 0.01 % (0.1 mg/gram) vaginal cream Insert into the vagina.   evolocumab (REPATHA SURECLICK) 140 mg/mL PnIj Inject the contents of 1 pen (140 mg) under the skin every fourteen (14) days. 6 mL 4   fluticasone propionate (FLONASE) 50 mcg/actuation nasal spray 2 sprays by Each Nare route Two (2) times a day. 16 mL prn   gabapentin (NEURONTIN) 100 MG capsule Take 2 capsules (200 mg total) by mouth every morning. 180 capsule 3   gabapentin (NEURONTIN) 300 MG capsule Take 1 capsule (300 mg total) by mouth nightly. 90 capsule 3   hydroCHLOROthiazide (HYDRODIURIL) 25 MG tablet Take 1 tablet (25 mg total) by mouth daily. 90 tablet 3   leflunomide (ARAVA) 20 MG tablet Take 1 tablet by mouth daily.   levocetirizine (XYZAL) 5 MG tablet TAKE 1 TABLET BY MOUTH EVERY DAY 90 tablet 4   levothyroxine (SYNTHROID) 50 MCG tablet Take one and half pills by mouth one day a week (such as every Monday) and then take one pill once a day for all of the other day. 94 tablet 3   multivitamin (TAB-A-VITE/THERAGRAN) per tablet Take 1 tablet by mouth daily.   naproxen (NAPROSYN) 500 MG tablet Take 1 tablet (500 mg total) by mouth 2 (two) times a day with  meals. TAKE 1 TABLET (500 MG TOTAL) BY MOUTH 2 (TWO) TIMES A DAY WITH MEALS. 60 tablet 11   omeprazole (PRILOSEC) 40 MG capsule Take 1 capsule (40 mg total) by mouth daily. 90 capsule 3   tiZANidine (ZANAFLEX) 2 MG tablet Take 1 tablet (2 mg total) by mouth every eight (8) hours as needed (muscle pain) for up to 30 doses. 30 tablet 0   traMADoL (ULTRAM) 50 mg tablet Take 1 tablet (50 mg total) by mouth every six (6) hours as needed for pain. 20 tablet 1   traZODone (DESYREL) 50 MG tablet TAKE 1 TABLET BY MOUTH EVERY DAY AT NIGHT 90 tablet 1   triamcinolone (KENALOG) 0.1 % ointment Apply topically in the evening to  ear. 15 g 2   Current Facility-Administered Medications  Medication Dose Route Frequency Provider Last Rate Last Admin   galcanezumab-gnlm (EMGALITY) injection 120 mg 120 mg Subcutaneous Q30 Days Lolita Lenz, MD 120 mg at 02/22/20 1058      Allergies Penicillins, Sulfa antibiotics, and Toradol [ketorolac tromethamine]  No family history on file.  Social History    Review of Systems  Constitutional: No fever/chills Eyes: No visual changes. ENT: No sore throat. Cardiovascular: Denies chest pain. Respiratory: Denies shortness of breath. Gastrointestinal: No abdominal pain.  No nausea, no vomiting.  No diarrhea.  No constipation. Genitourinary: Negative for dysuria. Musculoskeletal: Negative for back pain. Skin: Negative for rash. Neurological: Negative for headaches, focal weakness   ____________________________________________   PHYSICAL EXAM:  VITAL SIGNS: ED Triage Vitals [03/23/20 1505]  Enc Vitals Group     BP      Pulse Rate 85     Resp 19     Temp 97.7 F (36.5 C)     Temp Source Oral     SpO2 (!) 87 %     Weight 155 lb (70.3 kg)     Height 5\' 2"  (1.575 m)     Head Circumference      Peak Flow      Pain Score 4     Pain Loc      Pain Edu?      Excl. in GC?     Constitutional: Alert and oriented.  Looks weak and tired Eyes: Conjunctivae are normal. . Head: Atraumatic. Nose: No congestion/rhinnorhea. Mouth/Throat: Mucous membranes are moist.  Oropharynx non-erythematous. Neck: No stridor.  Cardiovascular: Normal rate, regular rhythm. Grossly normal heart sounds.  Good peripheral circulation. Respiratory: Normal respiratory effort.  No retractions. Lungs some scattered crackles Gastrointestinal: Soft and nontender. No distention. No abdominal bruits.  Musculoskeletal: No lower extremity tenderness nor edema.  . Neurologic:  Normal speech and language. Skin:  Skin is warm, dry and intact. No rash  noted.   ____________________________________________   LABS (all labs ordered are listed, but only abnormal results are displayed)  Labs Reviewed  BASIC METABOLIC PANEL - Abnormal; Notable for the following components:      Result Value   Chloride 95 (*)    Glucose, Bld 106 (*)    BUN 25 (*)    Anion gap 17 (*)    All other components within normal limits  POC SARS CORONAVIRUS 2 AG -  ED - Abnormal; Notable for the following components:   SARS Coronavirus 2 Ag Positive (*)    All other components within normal limits  POC SARS CORONAVIRUS 2 AG -  ED - Abnormal; Notable for the following components:   SARS Coronavirus 2 Ag POSITIVE (*)  All other components within normal limits  TROPONIN I (HIGH SENSITIVITY) - Abnormal; Notable for the following components:   Troponin I (High Sensitivity) 22 (*)    All other components within normal limits  CBC  HEPATIC FUNCTION PANEL  TROPONIN I (HIGH SENSITIVITY)   ____________________________________________  EKG EKG read interpreted by me shows normal sinus rhythm rate of 89 normal axis nonspecific ST-T changes ____________________________________________  RADIOLOGY Jill Poling, personally viewed and evaluated these images (plain radiographs) as part of my medical decision making, as well as reviewing the written report by the radiologist.  ED MD interpretation: Chest x-ray read by radiology reviewed by me is suspicious for Covid pneumonia.  Official radiology report(s): DG Chest 2 View  Result Date: 03/23/2020 CLINICAL DATA:  Shortness of breath EXAM: CHEST - 2 VIEW COMPARISON:  None. FINDINGS: Patchy bilateral mid to lower lung airspace opacities. No pleural effusion. Mild cardiomegaly. No pneumothorax. Moderate hiatal hernia IMPRESSION: Patchy bilateral mid to lower lung airspace opacities suspicious for multifocal pneumonia, possible atypical or viral pneumonia. Hiatal hernia. Electronically Signed   By: Jasmine Pang  M.D.   On: 03/23/2020 15:57    ____________________________________________   PROCEDURES  Procedure(s) performed (including Critical Care):  Procedures   ____________________________________________   INITIAL IMPRESSION / ASSESSMENT AND PLAN / ED COURSE  Patient's Covid test is positive her chest x-ray is consistent with pneumonia additionally patient says she is not eating or drinking very much at all.  Her BUN and creatinine bear this out.  She does not have a white count.  Initial troponin is 22 waiting on the second 1 but she is not having any chest pain.  EKG does not show anything acute corrected C.  Patient is hypoxic and immune compromised with the lupus will need to come in the hospital for aggressive treatment.              ____________________________________________   FINAL CLINICAL IMPRESSION(S) / ED DIAGNOSES  Final diagnoses:  Hypoxia  Weakness  COVID     ED Discharge Orders    None      *Please note:  Tara Cross was evaluated in Emergency Department on 03/23/2020 for the symptoms described in the history of present illness. She was evaluated in the context of the global COVID-19 pandemic, which necessitated consideration that the patient might be at risk for infection with the SARS-CoV-2 virus that causes COVID-19. Institutional protocols and algorithms that pertain to the evaluation of patients at risk for COVID-19 are in a state of rapid change based on information released by regulatory bodies including the CDC and federal and state organizations. These policies and algorithms were followed during the patient's care in the ED.  Some ED evaluations and interventions may be delayed as a result of limited staffing during and the pandemic.*   Note:  This document was prepared using Dragon voice recognition software and may include unintentional dictation errors.    Arnaldo Natal, MD 03/23/20 1640

## 2020-03-23 NOTE — ED Notes (Signed)
Pharmacy contacted requesting to tube ordered flonase and albuterol inhaler for RN to administer.

## 2020-03-23 NOTE — ED Notes (Signed)
Patient [place] increased from 4 L to 6L humidified due to oxygen saturation 87-89%. Saturation improved to 91%. RN to continue to monitor.

## 2020-03-23 NOTE — ED Notes (Addendum)
PT placed on 2L due to RA stat being 86%. Pt had no improvement. O2 bumped up to 3L, still no improvement. Pt placed on 4L O2 via [place], O2 stat increased to 92%. First RN Megan informed at this time

## 2020-03-23 NOTE — ED Triage Notes (Addendum)
Pt arrived to ED with c/o weakness. Pt states she "just doesn't feel good" and PCP put her on antibiotics. Pt unable to tell this RN why PCP prescribed antibiotics.   Pt also states chest pain and SOB. Pt states no PO intake for the last few days, feels dehydrated. Pt states " I feel like I'm gonna pass out"

## 2020-03-23 NOTE — Consult Note (Signed)
Remdesivir - Pharmacy Brief Note   O:  CXR: Patchy bilateral mid to lower lung airspace opacities suspicious for multifocal pneumonia, possible atypical or viral pneumonia. Hiatal hernia. SpO2: 87% on room air   A/P:  Remdesivir 200 mg IVPB once followed by 100 mg IVPB daily x 4 days.   Reatha Armour, PharmD Pharmacy Resident  03/23/2020 5:16 PM

## 2020-03-23 NOTE — ED Notes (Addendum)
Respiratory called to evaluate patient oxygenation and appropriate intervention as oxygen saturations have decreased again and maintain between 86-88% on humidified 6L. Per RT will come and place patient on high flow [place] and evaluate effectiveness.

## 2020-03-23 NOTE — ED Notes (Signed)
Pharmacy contacted requesting to tube medications for administration.

## 2020-03-23 NOTE — ED Notes (Signed)
Pharmacy contacted requesting review of ordered Remdesivir as patient has infusion running actively. Awaiting response regarding re-timing.

## 2020-03-23 NOTE — H&P (Signed)
History and Physical    Tara Cross QIO:[id number] DOB: [birth year] DOA: 03/23/2020  PCP: Lolita Lenz, MD Patient coming from: Home  Chief Complaint: Feeling bad  HPI: Tara Cross is a 69 y.o. female with medical history significant of lymphoma s/p chemo, hypothyroidism, latent TB, lupus. Patient reports several days (about one week) of not feeling well. She reports nausea, poor appetite, dyspnea, chest pain that has been progressively worsening.  ED Course: Vitals: Afebrile, pulse of 85, respiration of 19/min, SpO2 of 87% on room air Labs: anion gap of 17 Imaging: Chest x-ray consistent with multifocal pneumonia Medications/Course: Patient placed on 4 L of oxygen via [place]  Review of Systems: Review of Systems  Constitutional: Positive for chills and malaise/fatigue. Negative for fever.  Respiratory: Positive for cough (mild) and shortness of breath. Negative for wheezing.   Cardiovascular: Positive for chest pain. Negative for leg swelling.  Gastrointestinal: Positive for abdominal pain and nausea. Negative for constipation, diarrhea and vomiting.  All other systems reviewed and are negative.   History reviewed. No pertinent past medical history.  Past Surgical History:  Procedure Laterality Date  . NISSEN FUNDOPLICATION       has no history on file for tobacco use, alcohol use, and drug use.  Allergies  Allergen Reactions  . Penicillins   . Sulfa Antibiotics   . Toradol [Ketorolac Tromethamine]     Family History  Problem Relation Age of Onset  . Cancer Father    Prior to Admission medications   Not on File    Physical Exam:  Physical Exam Constitutional:      General: She is not in acute distress.    Appearance: She is well-developed and well-nourished. She is not diaphoretic.  HENT:     Mouth/Throat:     Mouth: Oropharynx is clear and moist.  Eyes:     Extraocular Movements: EOM normal.     Conjunctiva/sclera: Conjunctivae normal.     Pupils:  Pupils are equal, round, and reactive to light.  Cardiovascular:     Rate and Rhythm: Normal rate and regular rhythm.     Heart sounds: Normal heart sounds and S1 normal. No murmur heard.     Comments: Prominent S2 Pulmonary:     Effort: Pulmonary effort is normal. No respiratory distress.     Breath sounds: Decreased breath sounds present. No wheezing or rales.  Abdominal:     General: Bowel sounds are normal. There is no distension.     Palpations: Abdomen is soft.     Tenderness: There is no abdominal tenderness. There is no guarding or rebound.  Musculoskeletal:        General: No tenderness or edema. Normal range of motion.     Cervical back: Normal range of motion.     Right lower leg: No edema.     Left lower leg: No edema.  Lymphadenopathy:     Cervical: No cervical adenopathy.  Skin:    General: Skin is warm and dry.  Neurological:     Mental Status: She is alert and oriented to person, place, and time.      Labs on Admission: I have personally reviewed following labs and imaging studies  CBC: Recent Labs  Lab 03/23/20 1509  WBC 7.8  HGB 14.3  HCT 41.5  MCV 95.6  PLT 272    Basic Metabolic Panel: Recent Labs  Lab 03/23/20 1509  NA 138  K 4.2  CL 95*  CO2 26  GLUCOSE 106*  BUN  25*  CREATININE 0.97  CALCIUM 9.7    GFR: Estimated Creatinine Clearance: 51 mL/min (by C-G formula based on SCr of 0.97 mg/dL).  Liver Function Tests: Recent Labs  Lab 03/23/20 1742  AST 41  ALT 40  ALKPHOS 185*  BILITOT 1.0  PROT 7.3  ALBUMIN 3.2*   No results for input(s): LIPASE, AMYLASE in the last 168 hours. No results for input(s): AMMONIA in the last 168 hours.  Coagulation Profile: No results for input(s): INR, PROTIME in the last 168 hours.  Cardiac Enzymes: No results for input(s): CKTOTAL, CKMB, CKMBINDEX, TROPONINI in the last 168 hours.  BNP (last 3 results) No results for input(s): PROBNP in the last 8760 hours.  HbA1C: No results for  input(s): HGBA1C in the last 72 hours.  CBG: No results for input(s): GLUCAP in the last 168 hours.  Lipid Profile: No results for input(s): CHOL, HDL, LDLCALC, TRIG, CHOLHDL, LDLDIRECT in the last 72 hours.  Thyroid Function Tests: No results for input(s): TSH, T4TOTAL, FREET4, T3FREE, THYROIDAB in the last 72 hours.  Anemia Panel: No results for input(s): VITAMINB12, FOLATE, FERRITIN, TIBC, IRON, RETICCTPCT in the last 72 hours.  Urine analysis: No results found for: COLORURINE, APPEARANCEUR, LABSPEC, PHURINE, GLUCOSEU, HGBUR, BILIRUBINUR, KETONESUR, PROTEINUR, UROBILINOGEN, NITRITE, LEUKOCYTESUR   Radiological Exams on Admission: DG Chest 2 View  Result Date: 03/23/2020 CLINICAL DATA:  Shortness of breath EXAM: CHEST - 2 VIEW COMPARISON:  None. FINDINGS: Patchy bilateral mid to lower lung airspace opacities. No pleural effusion. Mild cardiomegaly. No pneumothorax. Moderate hiatal hernia IMPRESSION: Patchy bilateral mid to lower lung airspace opacities suspicious for multifocal pneumonia, possible atypical or viral pneumonia. Hiatal hernia. Electronically Signed   By: Jasmine Pang M.D.   On: 03/23/2020 15:57    EKG: Independently reviewed. T-wave flattening in lateral leads. T-wave inversion in lead III only with mild flattening in aVF.  Assessment/Plan Active Problems:   Pneumonia due to COVID-19 virus   SLE (systemic lupus erythematosus) (HCC)   Depression   Anxiety   Hypothyroidism   Allergic rhinitis   Bilateral sacroiliitis (HCC)   COVID-19 Pneumonia Chest x-ray consistent with multifocal pneumonia. Patient with positive COVID-19 antigen test. Patient has received three doses of vaccine to date. History of latent TB. -Remdesivir per pharmacy -Solumedrol 1 mg/kg q12 hours -Daily CMP, CBC, Ferritin, D-dimer, CRP -Continue Oxygen to keep O2 saturation >85% at rest; wean as able -Prone as often as able; ambulate/mobilize as able -IV fluids overnight x12 hours since  she is somewhat dry; reevaluate for further fluids in AM  Acute respiratory failure with hypoxia Secondary to above. On 4 L of oxygen via [place] -Wean to room air as able  Lupus Patient is on leflunomide and Benlysta infusions as an outpatient.  Hypothyroidism -Resume home Synthroid 50 mcg daily  Allergic rhinitis On Xyzal, Flonase, azelastine as an outpatient -Loratadine, Flonase, azelastine  Depression -Resume home Cymbalta 60 mg daily and Klonopin 1 mg QHS prn  Sacroiliitis Patient follows at a pain clinic -Analgesics prn  Latent TB Patient mentioned no history, however, it appears there is a diagnosis of latent TB on chart review.  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

## 2020-03-24 DIAGNOSIS — U071 COVID-19: Secondary | ICD-10-CM | POA: Diagnosis not present

## 2020-03-24 DIAGNOSIS — M461 Sacroiliitis, not elsewhere classified: Secondary | ICD-10-CM | POA: Diagnosis not present

## 2020-03-24 DIAGNOSIS — F419 Anxiety disorder, unspecified: Secondary | ICD-10-CM | POA: Diagnosis not present

## 2020-03-24 DIAGNOSIS — J309 Allergic rhinitis, unspecified: Secondary | ICD-10-CM | POA: Diagnosis not present

## 2020-03-24 DIAGNOSIS — F32A Depression, unspecified: Secondary | ICD-10-CM

## 2020-03-24 LAB — CBC WITH DIFFERENTIAL/PLATELET
Abs Immature Granulocytes: 0.36 10*3/uL — ABNORMAL HIGH (ref 0.00–0.07)
Basophils Absolute: 0 10*3/uL (ref 0.0–0.1)
Basophils Relative: 1 %
Eosinophils Absolute: 0 10*3/uL (ref 0.0–0.5)
Eosinophils Relative: 0 %
HCT: 37.7 % (ref 36.0–46.0)
Hemoglobin: 13.1 g/dL (ref 12.0–15.0)
Immature Granulocytes: 6 %
Lymphocytes Relative: 9 %
Lymphs Abs: 0.5 10*3/uL — ABNORMAL LOW (ref 0.7–4.0)
MCH: 33.3 pg (ref 26.0–34.0)
MCHC: 34.7 g/dL (ref 30.0–36.0)
MCV: 95.9 fL (ref 80.0–100.0)
Monocytes Absolute: 0.1 10*3/uL (ref 0.1–1.0)
Monocytes Relative: 2 %
Neutro Abs: 4.7 10*3/uL (ref 1.7–7.7)
Neutrophils Relative %: 82 %
Platelets: 252 10*3/uL (ref 150–400)
RBC: 3.93 MIL/uL (ref 3.87–5.11)
RDW: 12.1 % (ref 11.5–15.5)
Smear Review: NORMAL
WBC: 5.8 10*3/uL (ref 4.0–10.5)
nRBC: 0 % (ref 0.0–0.2)

## 2020-03-24 LAB — COMPREHENSIVE METABOLIC PANEL
ALT: 35 U/L (ref 0–44)
AST: 40 U/L (ref 15–41)
Albumin: 3 g/dL — ABNORMAL LOW (ref 3.5–5.0)
Alkaline Phosphatase: 169 U/L — ABNORMAL HIGH (ref 38–126)
Anion gap: 13 (ref 5–15)
BUN: 23 mg/dL (ref 8–23)
CO2: 26 mmol/L (ref 22–32)
Calcium: 8.5 mg/dL — ABNORMAL LOW (ref 8.9–10.3)
Chloride: 102 mmol/L (ref 98–111)
Creatinine, Ser: 0.71 mg/dL (ref 0.44–1.00)
GFR, Estimated: >60 mL/min (ref >60)
Glucose, Bld: 155 mg/dL — ABNORMAL HIGH (ref 70–99)
Potassium: 4.1 mmol/L (ref 3.5–5.1)
Sodium: 141 mmol/L (ref 135–145)
Total Bilirubin: 0.9 mg/dL (ref 0.3–1.2)
Total Protein: 6.9 g/dL (ref 6.5–8.1)

## 2020-03-24 LAB — C-REACTIVE PROTEIN
CRP: 20.8 mg/dL — ABNORMAL HIGH (ref <1.0)
CRP: 19.7 mg/dL — ABNORMAL HIGH (ref <1.0)

## 2020-03-24 LAB — FIBRIN DERIVATIVES D-DIMER (ARMC ONLY): Fibrin derivatives D-dimer (ARMC): 3479.9 ng/mL (FEU) — ABNORMAL HIGH (ref 0.00–499.00)

## 2020-03-24 LAB — HIV ANTIBODY (ROUTINE TESTING W REFLEX): HIV Screen 4th Generation wRfx: NONREACTIVE

## 2020-03-24 LAB — FERRITIN: Ferritin: 996 ng/mL — ABNORMAL HIGH (ref 11–307)

## 2020-03-24 MED ORDER — BARICITINIB 2 MG PO TABS
4.0000 mg | ORAL_TABLET | Freq: Every day | ORAL | Status: DC
  Filled 2020-03-24: qty 2

## 2020-03-24 MED ORDER — HYDROCOD POLST-CPM POLST ER 10-8 MG/5ML PO SUER
5.0000 mL | Freq: Two times a day (BID) | ORAL | Status: DC
  Administered 2020-03-24 – 2020-04-03 (×19): 5 mL via ORAL
  Filled 2020-03-24 (×19): qty 5

## 2020-03-24 MED ORDER — IPRATROPIUM-ALBUTEROL 20-100 MCG/ACT IN AERS
1.0000 | INHALATION_SPRAY | Freq: Four times a day (QID) | RESPIRATORY_TRACT | Status: DC
  Administered 2020-03-24 – 2020-04-03 (×38): 1 via RESPIRATORY_TRACT
  Filled 2020-03-24 (×2): qty 4

## 2020-03-24 MED ORDER — ALBUTEROL SULFATE HFA 108 (90 BASE) MCG/ACT IN AERS
2.0000 | INHALATION_SPRAY | RESPIRATORY_TRACT | Status: DC | PRN
  Administered 2020-03-25 – 2020-03-30 (×3): 2 via RESPIRATORY_TRACT
  Filled 2020-03-24: qty 6.7

## 2020-03-24 NOTE — ED Notes (Signed)
Patient was satting upper 70s on the high flow.  I pulled her up in bed with no relief.  Called RT--they can't come now,b ut suggested NRB mask.  So I have placed that on her and her sat is now 91%.

## 2020-03-24 NOTE — Progress Notes (Signed)
PROGRESS NOTE    Dimitria Ketchum  YNW:[id number] DOB: [birth year] DOA: 03/23/2020 PCP: Lolita Lenz, MD    Brief Narrative:  Mrs. Alioto was admitted to the hospital working diagnosis of acute hypoxic respiratory failure due to SARS COVID-19 viral pneumonia.  69 year old female past medical history of lymphoma status post chemotherapy, hypothyroidism, lupus and latent TB.  She reported several days of not feeling well, on her initial physical examination she was afebrile, heart rate 85, respiratory rate 19, oxygen saturation 87% on room air, her lungs had decreased breath sounds bilaterally, heart S1-S2, present, loud P2, abdomen soft nontender, no lower extremity edema.  Chest radiograph with bilateral lower lobes interstitial infiltrates, faint right upper lobe infiltrate.     Assessment & Plan:   Principal Problem:   Pneumonia due to COVID-19 virus Active Problems:   SLE (systemic lupus erythematosus) (HCC)   Depression   Anxiety   Hypothyroidism   Allergic rhinitis   Bilateral sacroiliitis (HCC)   1.  Acute hypoxic respiratory failure due to SARS COVID-19 viral pneumonia.  RR: 15  Pulse oxymetry: 91 to 92%  Fi02: HFNC+ NRBM   COVID-19 Labs  Recent Labs    03/23/20 2130 03/24/20 0431 03/24/20 0437  FERRITIN 1,042* 996*  --   CRP 20.8*  --  19.7*    No results found for: SARSCOV2NAA  Patient with significant dyspnea and fatigue. Placed on non rebreather mask 15 L per min.   Not able to add baricitinib due to history of latent TB. Continue high dose systemic corticosteroids and antiviral therapy with remdesivir.  Add high flow nasal cannula to non rebreather mask to keep oxygen saturation more than 80%.   Continue with bronchodilators, antitussive agents and airway clearing techniques with flutter valve and incentive spirometer. Out of bed to chair tid with meals, PT and OT evaluation.   2. Lupus/ scarolilitis. No signs of exacerbation.   3. Hypothyroid.  Continue with levothyroxine.   4. Depression. Continue with cymbalta and klonipin.   5. Latent TB. Listed on medical history   Patient continue to be at high risk for worsening respiratory failure   Status is: Inpatient  Remains inpatient appropriate because:IV treatments appropriate due to intensity of illness or inability to take PO   Dispo: The patient is from: Home              Anticipated d/c is to: Home              Anticipated d/c date is: 1 day              Patient currently is not medically stable to d/c.   DVT prophylaxis: Enoxaparin   Code Status:   full  Family Communication:  I was not able to reach her husband, left a message.    Subjective: Patient continue to have general weakness and dyspnea, no nausea or vomiting.   Objective: Vitals:   03/24/20 1415 03/24/20 1430 03/24/20 1445 03/24/20 1500  BP:      Pulse: (!) 57 61 (!) 58 (!) 59  Resp: (!) 28 12 (!) 8 15  Temp:      TempSrc:      SpO2: 91% 94% 91% 92%  Weight:      Height:        Intake/Output Summary (Last 24 hours) at 03/24/2020 1555 Last data filed at 03/23/2020 1826 Gross per 24 hour  Intake 500 ml  Output --  Net 500 ml   [employer]  03/23/20 1505  Weight: 70.3 kg    Examination:   General: Not in pain or dyspnea, deconditioned  Neurology: Awake and alert, non focal  E ENT: mild pallor, no icterus, oral mucosa moist Cardiovascular: No JVD. S1-S2 present, rhythmic, no gallops, rubs, or murmurs. No lower extremity edema. Pulmonary: positive breath sounds bilaterally, with no wheezing, rhonchi or rales. Gastrointestinal. Abdomen soft and non tender Skin. No rashes Musculoskeletal: no joint deformities     Data Reviewed: I have personally reviewed following labs and imaging studies  CBC: Recent Labs  Lab 03/23/20 1509 03/23/20 2130 03/24/20 0431  WBC 7.8 5.8 5.8  NEUTROABS  --   --  4.7  HGB 14.3 13.3 13.1  HCT 41.5 39.6 37.7  MCV 95.6 96.4 95.9  PLT 272 256 252    Basic Metabolic Panel: Recent Labs  Lab 03/23/20 1509 03/23/20 2130 03/24/20 0431  NA 138  --  141  K 4.2  --  4.1  CL 95*  --  102  CO2 26  --  26  GLUCOSE 106*  --  155*  BUN 25*  --  23  CREATININE 0.97 0.74 0.71  CALCIUM 9.7  --  8.5*   GFR: Estimated Creatinine Clearance: 61.8 mL/min (by C-G formula based on SCr of 0.71 mg/dL). Liver Function Tests: Recent Labs  Lab 03/23/20 1742 03/24/20 0431  AST 41 40  ALT 40 35  ALKPHOS 185* 169*  BILITOT 1.0 0.9  PROT 7.3 6.9  ALBUMIN 3.2* 3.0*   No results for input(s): LIPASE, AMYLASE in the last 168 hours. No results for input(s): AMMONIA in the last 168 hours. Coagulation Profile: No results for input(s): INR, PROTIME in the last 168 hours. Cardiac Enzymes: No results for input(s): CKTOTAL, CKMB, CKMBINDEX, TROPONINI in the last 168 hours. BNP (last 3 results) No results for input(s): PROBNP in the last 8760 hours. HbA1C: No results for input(s): HGBA1C in the last 72 hours. CBG: No results for input(s): GLUCAP in the last 168 hours. Lipid Profile: No results for input(s): CHOL, HDL, LDLCALC, TRIG, CHOLHDL, LDLDIRECT in the last 72 hours. Thyroid Function Tests: No results for input(s): TSH, T4TOTAL, FREET4, T3FREE, THYROIDAB in the last 72 hours. Anemia Panel: Recent Labs    03/23/20 2130 03/24/20 0431  FERRITIN 1,042* 996*      Radiology Studies: I have reviewed all of the imaging during this hospital visit personally     Scheduled Meds: . albuterol  2 puff Inhalation Q6H  . vitamin C  500 mg Oral Daily  . azelastine  1 spray Each Nare BID  . DULoxetine  60 mg Oral Daily  . enoxaparin (LOVENOX) injection  40 mg Subcutaneous Q24H  . fluticasone  1 spray Each Nare Daily  . levothyroxine  50 mcg Oral Q0600  . loratadine  10 mg Oral Daily  . methylPREDNISolone (SOLU-MEDROL) injection  1 mg/kg Intravenous Q12H  . zinc sulfate  220 mg Oral Daily   Continuous Infusions: . remdesivir 100 mg in NS  100 mL Stopped (03/24/20 1206)     LOS: 1 day        Treyshaun Keatts Annett Gula, MD

## 2020-03-24 NOTE — ED Notes (Signed)
Patient adjusted in bed to promote comfort. Bed low and locked with side rails raised x2. Call bell in reach and cardiac monitor in place. Purewick applied.

## 2020-03-24 NOTE — ED Notes (Signed)
Messaged Alejandro Mulling, RN.  She states her floor is capped and patient is back in planning

## 2020-03-24 NOTE — ED Notes (Signed)
Patient oxygen level maintaining 90-93% following application of humidified HFNC 8L. Pt continuing to request to take home prescription of Klonopin due to concern for allergy to dye and feeling safe with her prescription. RN spoke with pharmacist who confirmed that hospital formulation of the medication is white and free from dyes and as such states that patient cannot be permitted to take home prescription at this time. Pt made aware.

## 2020-03-24 NOTE — ED Notes (Signed)
Patient sleeping soundly and oxygen saturation decreased to 86-87%. Humidified HFNC increased from 8 L to 10 L with improved saturation to 90-91% while patient sleeping.

## 2020-03-24 NOTE — ED Notes (Signed)
Pt found in her room with O2 saturation at 81% on 10L HFNC.  PT adjusted in the bed to be sitting up high, given solumedrol and albuterol inhaler and placed on 12 L HFNC.  Improvement of saturations shown at 91%.

## 2020-03-24 NOTE — ED Notes (Signed)
RT at bedside placing humidified high flow 8L [place]. Saturations improved to 91%. RN to continue to monitor.   Pt requesting Klonopin for sleep. Pt intermittently sleeping at time of request. Patient educated that medication may cause oxygen saturation to decrease further and RN would prefer to continue to monitor oxygen status to ensure stability following application of HFNC. Patient also stating that she can only take the "blue klonopin" due to an allergy dye and is requesting to take personal prescription should the medication available in hospital not be blue. Patient informed that medication in hospital is a white pill and should not contain any dye. Pt continues to request her personal prescription. RN informed patient she will consult with pharmacy and physician regarding taking personal prescription as long as patient's oxygen level maintains. Pt verbalized understanding.

## 2020-03-24 NOTE — ED Notes (Signed)
Medication Reconciliation Report  For Home History Technicians  HIGHLIGHTS:  1. The patient WAS personally interviewed 2. If not, what was the main source used: PHARMACY RECORDS 3. Does the patient appear to take any anti-coagulation agents (e.g. warfarin, Eliquis or Xarelto): NO 4. Does the patient appear to take any anti-convulsant agents (e.g. divalproex, levetiracetam or phenytoin): NO 5. Does the patient appear to use any insulin products (e.g. Lantus, Novolin or Humalog): NO 6. Does the patient appear to take any "beta-blockers" (e.g. metoprolol, carvedilol or bisoprolol: NO  BARRIERS:  1. Were there any barriers that prevented or complicated the medication reconciliation process: NO 2. If yes, what was the primary barrier encountered: None 3. Does the patient appear compliant with prescribed medications: YES 4. Does the patient express any barriers with compliance: NO 5. What is the primary barrier the patient reports: None   NOTES:[Include any concerns, remarks or complaints the patient expresses regarding medication therapy. Any observations or other information that might be useful to the treatment team can also be included. Immediate needs or concerns should be referred to the RN or appropriate member of the treatment team.]  Patient was interviewed and reports home medications as reflected in med reconciliation tab. Patient reports missing December's dose of EMGALITY (03/21/2020) due to feeling poorly. Patient also reports having been unable to receive REPATHA injections as she has ben unable to find a clinician at [facility] who will administer the medication.  Elmo Putt, CPhT [place] at [facility] [address] [place], [place] [address] [phone]  ** The above is intended solely for informational and/or communicative purposes. It should in no way be considered an endorsement of any specific treatment, therapy or action. **

## 2020-03-25 DIAGNOSIS — F419 Anxiety disorder, unspecified: Secondary | ICD-10-CM | POA: Diagnosis not present

## 2020-03-25 DIAGNOSIS — J309 Allergic rhinitis, unspecified: Secondary | ICD-10-CM | POA: Diagnosis not present

## 2020-03-25 DIAGNOSIS — U071 COVID-19: Secondary | ICD-10-CM | POA: Diagnosis not present

## 2020-03-25 DIAGNOSIS — M461 Sacroiliitis, not elsewhere classified: Secondary | ICD-10-CM | POA: Diagnosis not present

## 2020-03-25 LAB — COMPREHENSIVE METABOLIC PANEL
ALT: 35 U/L (ref 0–44)
AST: 56 U/L — ABNORMAL HIGH (ref 15–41)
Albumin: 3 g/dL — ABNORMAL LOW (ref 3.5–5.0)
Alkaline Phosphatase: 168 U/L — ABNORMAL HIGH (ref 38–126)
Anion gap: 12 (ref 5–15)
BUN: 37 mg/dL — ABNORMAL HIGH (ref 8–23)
CO2: 26 mmol/L (ref 22–32)
Calcium: 9.1 mg/dL (ref 8.9–10.3)
Chloride: 102 mmol/L (ref 98–111)
Creatinine, Ser: 0.68 mg/dL (ref 0.44–1.00)
GFR, Estimated: >60 mL/min (ref >60)
Glucose, Bld: 144 mg/dL — ABNORMAL HIGH (ref 70–99)
Potassium: 3.9 mmol/L (ref 3.5–5.1)
Sodium: 140 mmol/L (ref 135–145)
Total Bilirubin: 0.8 mg/dL (ref 0.3–1.2)
Total Protein: 6.8 g/dL (ref 6.5–8.1)

## 2020-03-25 LAB — FERRITIN: Ferritin: 1401 ng/mL — ABNORMAL HIGH (ref 11–307)

## 2020-03-25 LAB — FIBRIN DERIVATIVES D-DIMER (ARMC ONLY): Fibrin derivatives D-dimer (ARMC): 2334.2 ng/mL (FEU) — ABNORMAL HIGH (ref 0.00–499.00)

## 2020-03-25 LAB — C-REACTIVE PROTEIN: CRP: 8 mg/dL — ABNORMAL HIGH (ref <1.0)

## 2020-03-25 MED ORDER — TIZANIDINE HCL 2 MG PO TABS
2.0000 mg | ORAL_TABLET | Freq: Three times a day (TID) | ORAL | Status: DC | PRN
  Filled 2020-03-25: qty 1

## 2020-03-25 MED ORDER — LEVOCETIRIZINE DIHYDROCHLORIDE 5 MG PO TABS: 5.0000 mg | ORAL_TABLET | Freq: Every day | ORAL | Status: DC

## 2020-03-25 MED ORDER — TRAZODONE HCL 50 MG PO TABS
50.0000 mg | ORAL_TABLET | Freq: Every day | ORAL | Status: DC
Start: 2020-03-25 — End: 2020-04-03
  Administered 2020-03-25 – 2020-04-02 (×9): 50 mg via ORAL
  Filled 2020-03-25 (×9): qty 1

## 2020-03-25 MED ORDER — PANTOPRAZOLE SODIUM 40 MG PO TBEC
40.0000 mg | DELAYED_RELEASE_TABLET | Freq: Every day | ORAL | Status: DC
  Administered 2020-03-26 – 2020-03-27 (×2): 40 mg via ORAL
  Filled 2020-03-25 (×2): qty 1

## 2020-03-25 MED ORDER — DULOXETINE HCL 30 MG PO CPEP: 60.0000 mg | ORAL_CAPSULE | Freq: Every day | ORAL | Status: DC

## 2020-03-25 MED ORDER — CLONAZEPAM 1 MG PO TABS: 1.0000 mg | ORAL_TABLET | Freq: Every evening | ORAL | Status: DC | PRN

## 2020-03-25 MED ORDER — VITAMIN D 25 MCG (1000 UNIT) PO TABS
1000.0000 [IU] | ORAL_TABLET | Freq: Every day | ORAL | Status: DC
  Administered 2020-03-25 – 2020-04-03 (×10): 1000 [IU] via ORAL
  Filled 2020-03-25 (×10): qty 1

## 2020-03-25 MED ORDER — GABAPENTIN 100 MG PO CAPS
200.0000 mg | ORAL_CAPSULE | Freq: Every day | ORAL | Status: DC
  Administered 2020-03-25 – 2020-04-02 (×9): 200 mg via ORAL
  Filled 2020-03-25 (×9): qty 2

## 2020-03-25 MED ORDER — LEVOTHYROXINE SODIUM 50 MCG PO TABS
50.0000 ug | ORAL_TABLET | Freq: Every day | ORAL | Status: DC
Start: 2020-03-26 — End: 2020-03-25

## 2020-03-25 MED ORDER — NAPROXEN 500 MG PO TABS
500.0000 mg | ORAL_TABLET | Freq: Two times a day (BID) | ORAL | Status: DC | PRN
  Filled 2020-03-25: qty 1

## 2020-03-25 MED ORDER — TRAMADOL HCL 50 MG PO TABS: 25.0000 mg | ORAL_TABLET | Freq: Four times a day (QID) | ORAL | Status: DC | PRN

## 2020-03-25 NOTE — ED Notes (Signed)
Sats continue to drop to 88-89% on 15L HFNC. RT called, plan is to switch to heated high flow.

## 2020-03-25 NOTE — Progress Notes (Signed)
PROGRESS NOTE    Dominica Kent  TJQ:[id number] DOB: 09-14-1950 DOA: 03/23/2020 PCP: Lolita Lenz, MD    Brief Narrative:  Mrs. Losey was admitted to the hospital working diagnosis of acute hypoxic respiratory failure due to SARS COVID-19 viral pneumonia.  70 year old female past medical history of lymphoma status post chemotherapy, hypothyroidism, lupus and latent TB (follow up with [facility] on 2018, re-test negative and recommended close observation. Patient declined any further TB meds).  She reported several days of not feeling well, on her initial physical examination she was afebrile, heart rate 85, respiratory rate 19, oxygen saturation 87% on room air, her lungs had decreased breath sounds bilaterally, heart S1-S2, present, loud P2, abdomen soft nontender, no lower extremity edema.  Chest radiograph with bilateral lower lobes interstitial infiltrates, faint right upper lobe infiltrate.     Assessment & Plan:   Principal Problem:   Pneumonia due to COVID-19 virus Active Problems:   SLE (systemic lupus erythematosus) (HCC)   Depression   Anxiety   Hypothyroidism   Allergic rhinitis   Bilateral sacroiliitis (HCC)   1.  Acute hypoxic respiratory failure due to SARS COVID-19 viral pneumonia. Not able to add baricitinib due to history of latent TB.  RR: 18 Pulse oxymetry: 93%  Fi02: 55 L/min Fi02 92%   COVID-19 Labs  Recent Labs    03/23/20 2130 03/24/20 0431 03/24/20 0437 03/25/20 0727  FERRITIN 1,042* 996*  --  1,401*  CRP 20.8*  --  19.7* 8.0*   Fibrin derivatives 2,531, [phone]  No results found for: SARSCOV2NAA  She has worsening respiratory failure with increase oxygen requirements up to 55 L/min.  Inflammatory markers are trending dow.   Continue high dose systemic corticosteroids (methylprednisolone 70 mg IV q12 hr) and antiviral therapy with remdesivir # 3/5.   On bronchodilators, antitussive agents and airway clearing techniques with  flutter valve and incentive spirometer. Encourage out of bed to chair tid with meals, PT and OT evaluation.   Follow inflammatory markers, oxygenation and chest film in am.   2. Lupus/ scarolilitis. No clinical signs of exacerbation.   3. Hypothyroid. On levothyroxine.   4. Depression. On cymbalta and klonipin. Resume tizanidine,   5. Latent TB. Old records personally reviewed, 2018 consultation with ID at [facility], she tested negative QI, had treatment in [place] that did not tolerate. Patient declined any further TB meds.  Recommendation for close follow up.  6. Back pain. Chronic. Resume tramadol as needed.   Inflammatory markers are trending down, will continue to hold on baricitinib for now.   Patient continue to be at high risk for worsening respiratory failure   Status is: Inpatient  Remains inpatient appropriate because:IV treatments appropriate due to intensity of illness or inability to take PO   Dispo: The patient is from: Home              Anticipated d/c is to: Home              Anticipated d/c date is: > 3 days              Patient currently is not medically stable to d/c.   DVT prophylaxis: Enoxaparin   Code Status:   full  Family Communication:  I spoke over the phone with the patient's husband about patient's  condition, plan of care, prognosis and all questions were addressed.     Subjective: Patient continue to have dyspnea, no nausea or vomiting, positive back pain, No chest pain,.  Objective: Vitals:   03/25/20 0530 03/25/20 0532 03/25/20 0743 03/25/20 1121  BP: (!) 130/54   124/72  Pulse: (!) 45 (!) 52  60  Resp: (!) 30 (!) 33  18  Temp:    97.6 F (36.4 C)  TempSrc:    Oral  SpO2: 90% 92% (!) 89% 93%  Weight:      Height:        Intake/Output Summary (Last 24 hours) at 03/25/2020 1536 Last data filed at 03/25/2020 0329 Gross per 24 hour  Intake 878.41 ml  Output -  Net 878.41 ml   Filed Weights   03/23/20 1505  Weight: 70.3 kg     Examination:   General: Not in pain or dyspnea, deconditioned  Neurology: Awake and alert, non focal  E ENT: mild pallor, no icterus, oral mucosa moist Cardiovascular: No JVD. S1-S2 present, rhythmic, no gallops, rubs, or murmurs. No lower extremity edema. Pulmonary: positive breath sounds bilaterally, decreased air movement, Gastrointestinal. Abdomen soft and non tender Skin. No rashes Musculoskeletal: no joint deformities     Data Reviewed: I have personally reviewed following labs and imaging studies  CBC: Recent Labs  Lab 03/23/20 1509 03/23/20 2130 03/24/20 0431  WBC 7.8 5.8 5.8  NEUTROABS  --   --  4.7  HGB 14.3 13.3 13.1  HCT 41.5 39.6 37.7  MCV 95.6 96.4 95.9  PLT 272 256 252   Basic Metabolic Panel: Recent Labs  Lab 03/23/20 1509 03/23/20 2130 03/24/20 0431 03/25/20 0727  NA 138  --  141 140  K 4.2  --  4.1 3.9  CL 95*  --  102 102  CO2 26  --  26 26  GLUCOSE 106*  --  155* 144*  BUN 25*  --  23 37*  CREATININE 0.97 0.74 0.71 0.68  CALCIUM 9.7  --  8.5* 9.1   GFR: Estimated Creatinine Clearance: 61.8 mL/min (by C-G formula based on SCr of 0.68 mg/dL). Liver Function Tests: Recent Labs  Lab 03/23/20 1742 03/24/20 0431 03/25/20 0727  AST 41 40 56*  ALT 40 35 35  ALKPHOS 185* 169* 168*  BILITOT 1.0 0.9 0.8  PROT 7.3 6.9 6.8  ALBUMIN 3.2* 3.0* 3.0*   No results for input(s): LIPASE, AMYLASE in the last 168 hours. No results for input(s): AMMONIA in the last 168 hours. Coagulation Profile: No results for input(s): INR, PROTIME in the last 168 hours. Cardiac Enzymes: No results for input(s): CKTOTAL, CKMB, CKMBINDEX, TROPONINI in the last 168 hours. BNP (last 3 results) No results for input(s): PROBNP in the last 8760 hours. HbA1C: No results for input(s): HGBA1C in the last 72 hours. CBG: No results for input(s): GLUCAP in the last 168 hours. Lipid Profile: No results for input(s): CHOL, HDL, LDLCALC, TRIG, CHOLHDL, LDLDIRECT in the  last 72 hours. Thyroid Function Tests: No results for input(s): TSH, T4TOTAL, FREET4, T3FREE, THYROIDAB in the last 72 hours. Anemia Panel: Recent Labs    03/24/20 0431 03/25/20 0727  FERRITIN 996* 1,401*      Radiology Studies: I have reviewed all of the imaging during this hospital visit personally     Scheduled Meds: . vitamin C  500 mg Oral Daily  . azelastine  1 spray Each Nare BID  . chlorpheniramine-HYDROcodone  5 mL Oral Q12H  . DULoxetine  60 mg Oral Daily  . enoxaparin (LOVENOX) injection  40 mg Subcutaneous Q24H  . fluticasone  1 spray Each Nare Daily  . Ipratropium-Albuterol  1 puff  Inhalation QID  . levothyroxine  50 mcg Oral Q0600  . loratadine  10 mg Oral Daily  . methylPREDNISolone (SOLU-MEDROL) injection  1 mg/kg Intravenous Q12H  . zinc sulfate  220 mg Oral Daily   Continuous Infusions: . remdesivir 100 mg in NS 100 mL 100 mg (03/25/20 1230)     LOS: 2 days        Nazario Russom Gerome Apley, MD

## 2020-03-25 NOTE — ED Notes (Signed)
Call lab to request they come for AM lab draws. 

## 2020-03-25 NOTE — ED Notes (Signed)
Pt continues to desat while asleep, is mouth-breathing. 15L HFNC is applied, simple mask applied to contain O2, sats now 95%.

## 2020-03-26 DIAGNOSIS — M3219 Other organ or system involvement in systemic lupus erythematosus: Principal | ICD-10-CM

## 2020-03-26 DIAGNOSIS — M461 Sacroiliitis, not elsewhere classified: Secondary | ICD-10-CM | POA: Diagnosis not present

## 2020-03-26 DIAGNOSIS — J309 Allergic rhinitis, unspecified: Secondary | ICD-10-CM | POA: Diagnosis not present

## 2020-03-26 DIAGNOSIS — U071 COVID-19: Secondary | ICD-10-CM | POA: Diagnosis not present

## 2020-03-26 DIAGNOSIS — F32A Depression, unspecified: Secondary | ICD-10-CM | POA: Diagnosis not present

## 2020-03-26 LAB — COMPREHENSIVE METABOLIC PANEL
ALT: 38 U/L (ref 0–44)
AST: 47 U/L — ABNORMAL HIGH (ref 15–41)
Albumin: 3.1 g/dL — ABNORMAL LOW (ref 3.5–5.0)
Alkaline Phosphatase: 149 U/L — ABNORMAL HIGH (ref 38–126)
Anion gap: 12 (ref 5–15)
BUN: 42 mg/dL — ABNORMAL HIGH (ref 8–23)
CO2: 25 mmol/L (ref 22–32)
Calcium: 9.3 mg/dL (ref 8.9–10.3)
Chloride: 102 mmol/L (ref 98–111)
Creatinine, Ser: 0.67 mg/dL (ref 0.44–1.00)
GFR, Estimated: >60 mL/min (ref >60)
Glucose, Bld: 157 mg/dL — ABNORMAL HIGH (ref 70–99)
Potassium: 4.1 mmol/L (ref 3.5–5.1)
Sodium: 139 mmol/L (ref 135–145)
Total Bilirubin: 1 mg/dL (ref 0.3–1.2)
Total Protein: 6.5 g/dL (ref 6.5–8.1)

## 2020-03-26 LAB — FERRITIN: Ferritin: 710 ng/mL — ABNORMAL HIGH (ref 11–307)

## 2020-03-26 LAB — FIBRIN DERIVATIVES D-DIMER (ARMC ONLY): Fibrin derivatives D-dimer (ARMC): 1837.43 ng/mL (FEU) — ABNORMAL HIGH (ref 0.00–499.00)

## 2020-03-26 LAB — C-REACTIVE PROTEIN: CRP: 2.8 mg/dL — ABNORMAL HIGH (ref <1.0)

## 2020-03-26 NOTE — Progress Notes (Signed)
PROGRESS NOTE    Tara Cross  ZOX:[id number] DOB: 08-27-1950 DOA: 03/23/2020 PCP: Lolita Lenz, MD    Brief Narrative:  Tara Cross admitted to the hospital working diagnosis of acute hypoxic respiratory failure due to SARS COVID-19 viral pneumonia.  70 year old female past medical history of lymphoma status post chemotherapy, hypothyroidism, lupus and latent TB (follow up with [facility] on 2018, re-test negative and recommended close observation. Patient declined any further TB meds). She reported several days of not feeling well, on her initial physical examination she was afebrile, heart rate 85, respiratory rate 19, oxygen saturation 87% on room air, her lungs had decreased breath sounds bilaterally, heart S1-S2, present, loud P2, abdomen soft nontender, no lower extremity edema.  Chest radiograph with bilateral lower lobesinterstitial infiltrates, faint right upper lobe infiltrate.  Patient on high oxygen requirements per heated high flow nasal cannula.   Assessment & Plan:   Principal Problem:   Pneumonia due to COVID-19 virus Active Problems:   SLE (systemic lupus erythematosus) (HCC)   Depression   Anxiety   Hypothyroidism   Allergic rhinitis   Bilateral sacroiliitis (HCC)    1.Acute hypoxic respiratory failure due to SARS COVID-19 viral pneumonia. Not able to add baricitinib due to history of latent TB.  RR: 20  Pulse oxymetry: 94%- 96%  Fi02: 45 L/min per Heated HFNC 85%   COVID-19 Labs  Recent Labs    03/23/20 2130 03/24/20 0431 03/24/20 0437 03/25/20 0727 03/26/20 1045  FERRITIN 1,042* 996*  --  1,401* 710*  CRP 20.8*  --  19.7* 8.0*  --    Fibrin derivatives 2,531, 8,119-1,478- 1,837  No results found for: SARSCOV2NAA  Decreased oxygen flow with good toleration. Inflammatory markers are trending down. She reports mild improvement in dyspnea but not yet back to baseline, worse with exertion.   Tolerating well high dose systemic  corticosteroids with methylprednisolone 70 mg IV q12 hr. Continue with antiviral therapy with remdesivir # 4/5.   Continue with bronchodilators, antitussive agents and airway clearing techniques with flutter valve and incentive spirometer.  Out of bed to chair tid with meals, still pending PT and OT evaluation.   2. Lupus/ scarolilitis. No signs of acute exacerbation.   3. Hypothyroid. Continue with levothyroxine.   4. Depression. continue witih cymbalta, clonazepam and tizanidine,   5. Latent TB. Old records personally reviewed, 2018 consultation with ID at [facility], she tested negative QI, had treatment in [place] that did not tolerate. Patient declined any further TB meds.  Recommendation for close follow up.  Holding baricitinib.   6. Back pain. Chronic. Continue with tramadol as needed.     Patient continue to be at high risk for worsening respiratory failure.   Status is: Inpatient  Remains inpatient appropriate because:IV treatments appropriate due to intensity of illness or inability to take PO   Dispo: The patient is from: Home              Anticipated d/c is to: Home              Anticipated d/c date is: > 3 days              Patient currently is not medically stable to d/c.   DVT prophylaxis: Enoxaparin   Code Status:   full  Family Communication:  I spoke over the phone with the patient's husband about patient's  condition, plan of care, prognosis and all questions were addressed.   Subjective: Patient continue to have significant dyspnea,  only mild improvement, worse symptoms with movement. Better back pain, mild nausea this am, but not vomiting and tolerating po well.   Objective: Vitals:   03/26/20 0416 03/26/20 0434 03/26/20 0838 03/26/20 0900  BP: 127/74   136/74  Pulse: 60   66  Resp: 20   19  Temp: 97.9 F (36.6 C)   98.6 F (37 C)  TempSrc: Oral   Oral  SpO2: 96% 96% 94% 98%  Weight: 67.9 kg     Height:        Intake/Output Summary (Last 24  hours) at 03/26/2020 0954 Last data filed at 03/25/2020 1800 Gross per 24 hour  Intake 240 ml  Output -  Net 240 ml   Filed Weights   03/23/20 1505 03/26/20 0416  Weight: 70.3 kg 67.9 kg    Examination:   General: positive dyspnea, deconditioned and ill looking appearing  Neurology: Awake and alert, non focal  E ENT: mild pallor, no icterus, oral mucosa moist Cardiovascular: No JVD. S1-S2 present, rhythmic, no gallops, rubs, or murmurs. No lower extremity edema. Pulmonary: positive  breath sounds bilaterally, no wheezing  Gastrointestinal. Abdomen soft and non tender Skin. No rashes Musculoskeletal: no joint deformities     Data Reviewed: I have personally reviewed following labs and imaging studies  CBC: Recent Labs  Lab 03/23/20 1509 03/23/20 2130 03/24/20 0431  WBC 7.8 5.8 5.8  NEUTROABS  --   --  4.7  HGB 14.3 13.3 13.1  HCT 41.5 39.6 37.7  MCV 95.6 96.4 95.9  PLT 272 256 151   Basic Metabolic Panel: Recent Labs  Lab 03/23/20 1509 03/23/20 2130 03/24/20 0431 03/25/20 0727  NA 138  --  141 140  K 4.2  --  4.1 3.9  CL 95*  --  102 102  CO2 26  --  26 26  GLUCOSE 106*  --  155* 144*  BUN 25*  --  23 37*  CREATININE 0.97 0.74 0.71 0.68  CALCIUM 9.7  --  8.5* 9.1   GFR: Estimated Creatinine Clearance: 60.8 mL/min (by C-G formula based on SCr of 0.68 mg/dL). Liver Function Tests: Recent Labs  Lab 03/23/20 1742 03/24/20 0431 03/25/20 0727  AST 41 40 56*  ALT 40 35 35  ALKPHOS 185* 169* 168*  BILITOT 1.0 0.9 0.8  PROT 7.3 6.9 6.8  ALBUMIN 3.2* 3.0* 3.0*   No results for input(s): LIPASE, AMYLASE in the last 168 hours. No results for input(s): AMMONIA in the last 168 hours. Coagulation Profile: No results for input(s): INR, PROTIME in the last 168 hours. Cardiac Enzymes: No results for input(s): CKTOTAL, CKMB, CKMBINDEX, TROPONINI in the last 168 hours. BNP (last 3 results) No results for input(s): PROBNP in the last 8760 hours. HbA1C: No  results for input(s): HGBA1C in the last 72 hours. CBG: No results for input(s): GLUCAP in the last 168 hours. Lipid Profile: No results for input(s): CHOL, HDL, LDLCALC, TRIG, CHOLHDL, LDLDIRECT in the last 72 hours. Thyroid Function Tests: No results for input(s): TSH, T4TOTAL, FREET4, T3FREE, THYROIDAB in the last 72 hours. Anemia Panel: Recent Labs    03/24/20 0431 03/25/20 0727  FERRITIN 996* 1,401*      Radiology Studies: I have reviewed all of the imaging during this hospital visit personally     Scheduled Meds: . vitamin C  500 mg Oral Daily  . azelastine  1 spray Each Nare BID  . chlorpheniramine-HYDROcodone  5 mL Oral Q12H  . cholecalciferol  1,000 Units  Oral Daily  . DULoxetine  60 mg Oral Daily  . enoxaparin (LOVENOX) injection  40 mg Subcutaneous Q24H  . fluticasone  1 spray Each Nare Daily  . gabapentin  200 mg Oral QHS  . Ipratropium-Albuterol  1 puff Inhalation QID  . levothyroxine  50 mcg Oral Q0600  . loratadine  10 mg Oral Daily  . methylPREDNISolone (SOLU-MEDROL) injection  1 mg/kg Intravenous Q12H  . pantoprazole  40 mg Oral Daily  . traZODone  50 mg Oral QHS  . zinc sulfate  220 mg Oral Daily   Continuous Infusions: . remdesivir 100 mg in NS 100 mL 100 mg (03/25/20 1230)     LOS: 3 days        Kenta Laster Annett Gula, MD

## 2020-03-27 DIAGNOSIS — U071 COVID-19: Secondary | ICD-10-CM | POA: Diagnosis not present

## 2020-03-27 DIAGNOSIS — M461 Sacroiliitis, not elsewhere classified: Secondary | ICD-10-CM | POA: Diagnosis not present

## 2020-03-27 DIAGNOSIS — J309 Allergic rhinitis, unspecified: Secondary | ICD-10-CM | POA: Diagnosis not present

## 2020-03-27 DIAGNOSIS — F419 Anxiety disorder, unspecified: Secondary | ICD-10-CM | POA: Diagnosis not present

## 2020-03-27 LAB — COMPREHENSIVE METABOLIC PANEL WITH GFR
ALT: 36 U/L (ref 0–44)
AST: 35 U/L (ref 15–41)
Albumin: 2.8 g/dL — ABNORMAL LOW (ref 3.5–5.0)
Alkaline Phosphatase: 125 U/L (ref 38–126)
Anion gap: 8 (ref 5–15)
BUN: 36 mg/dL — ABNORMAL HIGH (ref 8–23)
CO2: 26 mmol/L (ref 22–32)
Calcium: 8.8 mg/dL — ABNORMAL LOW (ref 8.9–10.3)
Chloride: 104 mmol/L (ref 98–111)
Creatinine, Ser: 0.62 mg/dL (ref 0.44–1.00)
GFR, Estimated: >60 mL/min
Glucose, Bld: 140 mg/dL — ABNORMAL HIGH (ref 70–99)
Potassium: 4.1 mmol/L (ref 3.5–5.1)
Sodium: 138 mmol/L (ref 135–145)
Total Bilirubin: 0.9 mg/dL (ref 0.3–1.2)
Total Protein: 6 g/dL — ABNORMAL LOW (ref 6.5–8.1)

## 2020-03-27 LAB — C-REACTIVE PROTEIN: CRP: 1.6 mg/dL — ABNORMAL HIGH (ref <1.0)

## 2020-03-27 LAB — FERRITIN: Ferritin: 474 ng/mL — ABNORMAL HIGH (ref 11–307)

## 2020-03-27 LAB — FIBRIN DERIVATIVES D-DIMER (ARMC ONLY): Fibrin derivatives D-dimer (ARMC): 1273.02 ng/mL (FEU) — ABNORMAL HIGH (ref 0.00–499.00)

## 2020-03-27 MED ORDER — SODIUM CHLORIDE 0.9 % IV SOLN
100.0000 mg | Freq: Every day | INTRAVENOUS | Status: DC
  Filled 2020-03-27: qty 20

## 2020-03-27 MED ORDER — SUCRALFATE 1 G PO TABS
1.0000 g | ORAL_TABLET | Freq: Three times a day (TID) | ORAL | Status: DC
  Administered 2020-03-27 – 2020-03-30 (×11): 1 g via ORAL
  Filled 2020-03-27 (×11): qty 1

## 2020-03-27 MED ORDER — SUCRALFATE 1 GM/10ML PO SUSP
1.0000 g | Freq: Three times a day (TID) | ORAL | Status: DC
  Filled 2020-03-27 (×4): qty 10

## 2020-03-27 MED ORDER — PANTOPRAZOLE SODIUM 40 MG PO TBEC
40.0000 mg | DELAYED_RELEASE_TABLET | Freq: Two times a day (BID) | ORAL | Status: DC
  Administered 2020-03-27 – 2020-04-03 (×14): 40 mg via ORAL
  Filled 2020-03-27 (×14): qty 1

## 2020-03-27 MED ORDER — ALUM & MAG HYDROXIDE-SIMETH 200-200-20 MG/5ML PO SUSP
30.0000 mL | ORAL | Status: DC | PRN
  Filled 2020-03-27: qty 30

## 2020-03-27 NOTE — Progress Notes (Signed)
PROGRESS NOTE    Tara Cross  RJJ:[id number] DOB: [birth year] DOA: 03/23/2020 PCP: Lolita Lenz, MD    Brief Narrative:  Tara Cross admitted to the hospital working diagnosis of acute hypoxic respiratory failure due to SARS COVID-19 viral pneumonia.  70 year old female past medical history of lymphoma status post chemotherapy, hypothyroidism, lupus and latent TB(follow up with [facility] on 2018, re-test negative and recommended close observation. Patient declined any further TB meds). She reported several days of not feeling well, on her initial physical examination she was afebrile, heart rate 85, respiratory rate 19, oxygen saturation 87% on room air, her lungs had decreased breath sounds bilaterally, heart S1-S2, present, loud P2, abdomen soft nontender, no lower extremity edema.  Chest radiograph with bilateral lower lobesinterstitial infiltrates, faint right upper lobe infiltrate.  Placed on remdesivir and systemic steroids, not able to use baricitinib due to hx of latent TB.   Patient on high oxygen requirements per heated high flow nasal cannula.  Slowly improving, but very weak and deconditioned    Assessment & Plan:   Principal Problem:   Pneumonia due to COVID-19 virus Active Problems:   SLE (systemic lupus erythematosus) (HCC)   Depression   Anxiety   Hypothyroidism   Allergic rhinitis   Bilateral sacroiliitis (HCC)   1.Acute hypoxic respiratory failure due to SARS COVID-19 viral pneumonia. Not able to add baricitinib due to history of latent TB.  RR: 20  Pulse oxymetry: 98% -100%  Fi02: 45 L/min per Heated HFNC 85%   COVID-19 Labs  Recent Labs    03/25/20 0727 03/26/20 1045 03/27/20 0450  FERRITIN 1,401* 710* 474*  CRP 8.0* 2.8* 1.6*   Fibrin derivatives 2,531, 0,160-1,093- 2,355-7,322   No results found for: SARSCOV2NAA  Inflammatory markers are improving, dyspnea better than yesterday but not yet back to baseline.   Continue  with methylprednisolone 70 mg IV q12 hr, will complete #5 dose of Remdesivir today.  On bronchodilators, antitussive agents and airway clearing techniques with flutter valve and incentive spirometer.  She has not been out of bed yet, pending PT and OT evaluation.  Decrease heated high flow nasal cannula to 40 L/min and Fi02 70%. Keep oxygen saturation 88% or greater.   2. Lupus/ scarolilitis. Noclinical signs of acute exacerbation.   3. Hypothyroid.On levothyroxine.   4. Depression.Oncymbalta, clonazepam and tizanidine. Patient having difficulty sleeping at night.   5. Latent TB.Old records personally reviewed, 2018 consultation with ID at [facility], she tested negative QI, had treatment in [place] that did not tolerate. Patient declined any further TB meds.  Recommendation for close follow up.  6. Back pain. Chronic. On tramadol and naproxen as needed. Gabapentin qhs.   7. GERD. Worsening reflux symptoms, will increase pantoprazole to bid and will add sucralfate.   Patient continue to be at high risk for worsening respiratory failure.   Status is: Inpatient  Remains inpatient appropriate because:IV treatments appropriate due to intensity of illness or inability to take PO   Dispo: The patient is from: Home              Anticipated d/c is to: Home              Anticipated d/c date is: > 3 days              Patient currently is not medically stable to d/c.  DVT prophylaxis: Enoxaparin   Code Status:   full  Family Communication:  I spoke over the phone with the patient's  husband about patient's  condition, plan of care, prognosis and all questions were addressed.    Subjective: Patient tolerating po well. Reports worsening reflux symptoms and difficulty sleeping at night. Dyspnea with some improvement but not yet back to baseline.   Objective: Vitals:   03/27/20 0243 03/27/20 0622 03/27/20 0835 03/27/20 0840  BP:  121/66 (!) 169/64 (!) 161/69  Pulse:  (!) 49 (!) 45   Resp:   18 20 20   Temp:  98.7 F (37.1 C) 97.7 F (36.5 C)   TempSrc:   Oral   SpO2: 94% 100% 98%   Weight:  67.5 kg    Height:        Intake/Output Summary (Last 24 hours) at 03/27/2020 1046 Last data filed at 03/26/2020 1530 Gross per 24 hour  Intake 100 ml  Output -  Net 100 ml   Filed Weights   03/23/20 1505 03/26/20 0416 03/27/20 0622  Weight: 70.3 kg 67.9 kg 67.5 kg    Examination:   General: deconditioned and ill looking appearing  Neurology: Awake and alert, non focal  E ENT: mild pallor, no icterus, oral mucosa moist Cardiovascular: No JVD. S1-S2 present, rhythmic, no gallops, rubs, or murmurs. No lower extremity edema. Pulmonary: positive breath sounds bilaterally, with no wheezing, rhonchi or rales. Gastrointestinal. Abdomen soft and non tender Skin. No rashes Musculoskeletal: no joint deformities     Data Reviewed: I have personally reviewed following labs and imaging studies  CBC: Recent Labs  Lab 03/23/20 1509 03/23/20 2130 03/24/20 0431  WBC 7.8 5.8 5.8  NEUTROABS  --   --  4.7  HGB 14.3 13.3 13.1  HCT 41.5 39.6 37.7  MCV 95.6 96.4 95.9  PLT 272 256 252   Basic Metabolic Panel: Recent Labs  Lab 03/23/20 1509 03/23/20 2130 03/24/20 0431 03/25/20 0727 03/26/20 1045 03/27/20 0450  NA 138  --  141 140 139 138  K 4.2  --  4.1 3.9 4.1 4.1  CL 95*  --  102 102 102 104  CO2 26  --  26 26 25 26   GLUCOSE 106*  --  155* 144* 157* 140*  BUN 25*  --  23 37* 42* 36*  CREATININE 0.97 0.74 0.71 0.68 0.67 0.62  CALCIUM 9.7  --  8.5* 9.1 9.3 8.8*   GFR: Estimated Creatinine Clearance: 60.7 mL/min (by C-G formula based on SCr of 0.62 mg/dL). Liver Function Tests: Recent Labs  Lab 03/23/20 1742 03/24/20 0431 03/25/20 0727 03/26/20 1045 03/27/20 0450  AST 41 40 56* 47* 35  ALT 40 35 35 38 36  ALKPHOS 185* 169* 168* 149* 125  BILITOT 1.0 0.9 0.8 1.0 0.9  PROT 7.3 6.9 6.8 6.5 6.0*  ALBUMIN 3.2* 3.0* 3.0* 3.1* 2.8*   No results for input(s): LIPASE,  AMYLASE in the last 168 hours. No results for input(s): AMMONIA in the last 168 hours. Coagulation Profile: No results for input(s): INR, PROTIME in the last 168 hours. Cardiac Enzymes: No results for input(s): CKTOTAL, CKMB, CKMBINDEX, TROPONINI in the last 168 hours. BNP (last 3 results) No results for input(s): PROBNP in the last 8760 hours. HbA1C: No results for input(s): HGBA1C in the last 72 hours. CBG: No results for input(s): GLUCAP in the last 168 hours. Lipid Profile: No results for input(s): CHOL, HDL, LDLCALC, TRIG, CHOLHDL, LDLDIRECT in the last 72 hours. Thyroid Function Tests: No results for input(s): TSH, T4TOTAL, FREET4, T3FREE, THYROIDAB in the last 72 hours. Anemia Panel: Recent Labs  03/26/20 1045 03/27/20 0450  FERRITIN 710* 474*      Radiology Studies: I have reviewed all of the imaging during this hospital visit personally     Scheduled Meds: . vitamin C  500 mg Oral Daily  . azelastine  1 spray Each Nare BID  . chlorpheniramine-HYDROcodone  5 mL Oral Q12H  . cholecalciferol  1,000 Units Oral Daily  . DULoxetine  60 mg Oral Daily  . enoxaparin (LOVENOX) injection  40 mg Subcutaneous Q24H  . fluticasone  1 spray Each Nare Daily  . gabapentin  200 mg Oral QHS  . Ipratropium-Albuterol  1 puff Inhalation QID  . levothyroxine  50 mcg Oral Q0600  . loratadine  10 mg Oral Daily  . methylPREDNISolone (SOLU-MEDROL) injection  1 mg/kg Intravenous Q12H  . pantoprazole  40 mg Oral Daily  . traZODone  50 mg Oral QHS  . zinc sulfate  220 mg Oral Daily   Continuous Infusions: . remdesivir 100 mg in NS 100 mL 100 mg (03/26/20 1142)     LOS: 4 days        Elisandra Deshmukh Gerome Apley, MD

## 2020-03-27 NOTE — Evaluation (Signed)
Physical Therapy Evaluation Patient Details Name: Tara Cross MRN: [id number] DOB: [birth year] Today's Date: 03/27/2020   History of Present Illness  Pt is a 70 year old female past medical history of lymphoma status post chemotherapy, hypothyroidism, lupus, chronic back pain, intractable migraines, and latent TB (follow up with [facility] on 2018, re-test negative and recommended close observation. Patient declined any further TB meds).  Chest radiograph with bilateral lower lobes interstitial infiltrates, faint right upper lobe infiltrate.    Clinical Impression  Pt alert, in bed, asking to transfer to [facility] to urinate at beginning of session. Per pt report, she is normally independent at home for ADLs (takes sponge baths but does get into the shower intermittently), husband assist with homemaking. She lives in a third floor apartment and her husband works second shift so is only able to assist at discharge intermittently.  The patient demonstrated bed mobility with extended time and supervision. Sit <> stand from EOB and from [facility] this session and able to stand and pivot with unilateral handheld assist, but did reach for support bilaterally. Pt with some fatigue noted during transfers; spO2 to mid 80s after exertion. Able to increase to 90s with rest breaks. Pt needed maxA to assist with pericare after use of BSC.  Overall the patient demonstrated deficits (see "PT Problem List") that impede the patient's functional abilities, safety, and mobility and would benefit from skilled PT intervention. Recommendation is SNF pending pt progress due to decreased caregiver support, inaccessible home environment and current level of assistance needed.     Follow Up Recommendations SNF    Equipment Recommendations  Other (comment) (TBD at next venue of care)    Recommendations for Other Services       Precautions / Restrictions Precautions Precautions: Fall Restrictions Weight Bearing Restrictions: No       Mobility  Bed Mobility Overal bed mobility: Needs Assistance Bed Mobility: Supine to Sit     Supine to sit: Supervision;HOB elevated          Transfers Overall transfer level: Needs assistance Equipment used: 1 person hand held assist Transfers: Sit to/from Stand;Stand Pivot Transfers Sit to Stand: Min guard Stand pivot transfers: Min guard       General transfer comment: 1 person handheld assist from [facility] to recliner in room  Ambulation/Gait             General Gait Details: deferred due to desaturation to mid 80s with mobility but recovered with rest  Stairs            Wheelchair Mobility    Modified Rankin (Stroke Patients Only)       Balance Overall balance assessment: Needs assistance Sitting-balance support: Feet supported Sitting balance-Leahy Scale: Good       Standing balance-Leahy Scale: Fair Standing balance comment: reliant on UE support at least unilaterally                             Pertinent Vitals/Pain Pain Assessment: No/denies pain    Home Living Family/patient expects to be discharged to:: Private residence Living Arrangements: Spouse/significant other Available Help at Discharge: Family;Available PRN/intermittently Type of Home: Apartment Home Access: Stairs to enter   Entrance Stairs-Number of Steps: 3rd floor apartment Home Layout: One level Home Equipment: None Additional Comments: husband works second shift, would not be available to assist    Prior Function           Comments: Pt modI for ADLs (  sponge baths normally) huband assists with homemaking duties     Hand Dominance   Dominant Hand: Right    Extremity/Trunk Assessment   Upper Extremity Assessment Upper Extremity Assessment: Generalized weakness    Lower Extremity Assessment Lower Extremity Assessment: Generalized weakness    Cervical / Trunk Assessment Cervical / Trunk Assessment: Normal  Communication    Communication: No difficulties  Cognition Arousal/Alertness: Awake/alert Behavior During Therapy: WFL for tasks assessed/performed Overall Cognitive Status: Within Functional Limits for tasks assessed                                        General Comments      Exercises Other Exercises Other Exercises: Patient assisted with pericare after urinating in [facility] maxA   Assessment/Plan    PT Assessment Patient needs continued PT services  PT Problem List Decreased strength;Decreased mobility;Decreased range of motion;Decreased activity tolerance;Decreased balance;Decreased knowledge of use of DME;Cardiopulmonary status limiting activity       PT Treatment Interventions DME instruction;Therapeutic exercise;Gait training;Balance training;Stair training;Neuromuscular re-education;Functional mobility training;Therapeutic activities;Patient/family education    PT Goals (Current goals can be found in the Care Plan section)  Acute Rehab PT Goals Patient Stated Goal: to get back to her PLOF PT Goal Formulation: With patient Time For Goal Achievement: 04/10/20 Potential to Achieve Goals: Good    Frequency Min 2X/week   Barriers to discharge Inaccessible home environment;Decreased caregiver support      Co-evaluation               AM-PAC PT "6 Clicks" Mobility  Outcome Measure Help needed turning from your back to your side while in a flat bed without using bedrails?: A Little Help needed moving from lying on your back to sitting on the side of a flat bed without using bedrails?: A Little Help needed moving to and from a bed to a chair (including a wheelchair)?: A Little Help needed standing up from a chair using your arms (e.g., wheelchair or bedside chair)?: A Little Help needed to walk in hospital room?: A Lot Help needed climbing 3-5 steps with a railing? : A Lot 6 Click Score: 16    End of Session Equipment Utilized During Treatment: Oxygen ([facility]) Activity  Tolerance: Patient tolerated treatment well Patient left: with chair alarm set;in chair;with call bell/phone within reach Nurse Communication: Mobility status PT Visit Diagnosis: Other abnormalities of gait and mobility (R26.89);Muscle weakness (generalized) (M62.81);Difficulty in walking, not elsewhere classified (R26.2)    Time: 1610-9604 PT Time Calculation (min) (ACUTE ONLY): 34 min   Charges:   PT Evaluation $PT Eval Moderate Complexity: 1 Mod PT Treatments $Therapeutic Activity: 8-22 mins        Olga Coaster PT, DPT 3:47 PM,03/27/20

## 2020-03-28 DIAGNOSIS — U071 COVID-19: Secondary | ICD-10-CM | POA: Diagnosis not present

## 2020-03-28 DIAGNOSIS — M461 Sacroiliitis, not elsewhere classified: Secondary | ICD-10-CM | POA: Diagnosis not present

## 2020-03-28 DIAGNOSIS — F419 Anxiety disorder, unspecified: Secondary | ICD-10-CM | POA: Diagnosis not present

## 2020-03-28 DIAGNOSIS — J309 Allergic rhinitis, unspecified: Secondary | ICD-10-CM | POA: Diagnosis not present

## 2020-03-28 LAB — COMPREHENSIVE METABOLIC PANEL
ALT: 33 U/L (ref 0–44)
AST: 25 U/L (ref 15–41)
Albumin: 2.8 g/dL — ABNORMAL LOW (ref 3.5–5.0)
Alkaline Phosphatase: 116 U/L (ref 38–126)
Anion gap: 9 (ref 5–15)
BUN: 33 mg/dL — ABNORMAL HIGH (ref 8–23)
CO2: 27 mmol/L (ref 22–32)
Calcium: 9.2 mg/dL (ref 8.9–10.3)
Chloride: 103 mmol/L (ref 98–111)
Creatinine, Ser: 0.58 mg/dL (ref 0.44–1.00)
GFR, Estimated: >60 mL/min (ref >60)
Glucose, Bld: 132 mg/dL — ABNORMAL HIGH (ref 70–99)
Potassium: 4.4 mmol/L (ref 3.5–5.1)
Sodium: 139 mmol/L (ref 135–145)
Total Bilirubin: 0.8 mg/dL (ref 0.3–1.2)
Total Protein: 6 g/dL — ABNORMAL LOW (ref 6.5–8.1)

## 2020-03-28 LAB — D-DIMER, QUANTITATIVE: D-Dimer, Quant: 1.18 ug/mL-FEU — ABNORMAL HIGH (ref 0.00–0.50)

## 2020-03-28 LAB — C-REACTIVE PROTEIN: CRP: 0.9 mg/dL (ref <1.0)

## 2020-03-28 LAB — FERRITIN: Ferritin: 472 ng/mL — ABNORMAL HIGH (ref 11–307)

## 2020-03-28 MED ORDER — METHYLPREDNISOLONE SODIUM SUCC 40 MG IJ SOLR
40.0000 mg | Freq: Two times a day (BID) | INTRAMUSCULAR | Status: DC
  Administered 2020-03-28 – 2020-04-02 (×10): 40 mg via INTRAVENOUS
  Filled 2020-03-28 (×10): qty 1

## 2020-03-28 NOTE — Progress Notes (Signed)
OT Cancellation Note  Patient Details Name: Teyona Nichelson MRN: [id number] DOB: 05/04/1950   Cancelled Treatment:    Reason Eval/Treat Not Completed: Patient declined, no reason specified;Other (comment)  OT consult received and chart reviewed. Pt politely declines to participate with OT for evaluation at this time citing fatigue and generally low energy. OT educates re: benefits of OOB activity to increase tolerance and energy. To this, pt reports that, upon speaking with dietician today, she will start getting more nutrition tomorrow and would like to try OT then. Will f/u tomorrow as planned/able/pt available. Thank you.  Rejeana Brock, MS, OTR/L ascom [phone] 03/28/20, 3:44 PM

## 2020-03-28 NOTE — Plan of Care (Signed)
  Problem: Education: Goal: Knowledge of General Education information will improve Description: Including pain rating scale, medication(s)/side effects and non-pharmacologic comfort measures Outcome: Progressing   Problem: Clinical Measurements: Goal: Will remain free from infection Outcome: Progressing   Problem: Clinical Measurements: Goal: Respiratory complications will improve Outcome: Progressing   Problem: Activity: Goal: Risk for activity intolerance will decrease Outcome: Progressing   Problem: Pain Managment: Goal: General experience of comfort will improve Outcome: Progressing   Problem: Safety: Goal: Ability to remain free from injury will improve Outcome: Progressing   

## 2020-03-28 NOTE — Progress Notes (Signed)
PROGRESS NOTE    Tara Cross  BJS:[id number] DOB: [birth year] DOA: 03/23/2020 PCP: Lolita Lenz, MD    Brief Narrative:  Mrs. Greenspan admitted to the hospital working diagnosis of acute hypoxic respiratory failure due to SARS COVID-19 viral pneumonia.  70 year old female past medical history of lymphoma status post chemotherapy, hypothyroidism, lupus and latent TB(follow up with [facility] on 2018, re-test negative and recommended close observation. Patient declined any further TB meds). She reported several days of not feeling well, on her initial physical examination she was afebrile, heart rate 85, respiratory rate 19, oxygen saturation 87% on room air, her lungs had decreased breath sounds bilaterally, heart S1-S2, present, loud P2, abdomen soft nontender, no lower extremity edema.  Chest radiograph with bilateral lower lobesinterstitial infiltrates, faint right upper lobe infiltrate.  Placed on remdesivir and systemic steroids, not able to use baricitinib due to hx of latent TB.   Patient on high oxygen requirements per heated high flow nasal cannula. Slowly improving, but very weak and deconditioned    Assessment & Plan:   Principal Problem:   Pneumonia due to COVID-19 virus Active Problems:   SLE (systemic lupus erythematosus) (HCC)   Depression   Anxiety   Hypothyroidism   Allergic rhinitis   Bilateral sacroiliitis (HCC)   1.Acute hypoxic respiratory failure due to SARS COVID-19 viral pneumonia. Not able to add baricitinib due to history of latent TB. Sp remdesivir #5/5  RR: 21 Pulse oxymetry: 96-98%  Fi02: 40 L/min per HFNC 70% Fi02  COVID-19 Labs  Recent Labs    03/26/20 1045 03/27/20 0450 03/28/20 0529  DDIMER  --   --  1.18*  FERRITIN 710* 474* 472*  CRP 2.8* 1.6* 0.9   Fibrin derivatives 2,531, 6,073-7,106- 2,694-8,546   No results found for: SARSCOV2NAA  Inflammatory markers continue improving, dyspnea also improving but not  yet back to baseline.  Medical therapy withmethylprednisolone, decrease dose to 40 mg IV q12 hr. Continue with bronchodilators, antitussive agents and airway clearing techniques with flutter valve and incentive spirometer..  Further decrease heated high flow nasal cannula to 30 L/min and Fi02 60%. Keep oxygen saturation 88% or greater.   2. Lupus/ scarolilitis. No signs ofacuteexacerbation.   3. Hypothyroid.Continue with levothyroxine.   4. Depression.Continue withcymbalta, clonazepam andtizanidine.  5. Latent TB.Old records personally reviewed, 2018 consultation with ID at [facility], she tested negative QI, had treatment in [place] that did not tolerate. Patient declined any further TB meds.  Recommendation for close follow up.  6. Back pain. Chronic.continue withtramadol and naproxen as needed. On gabapentin qhs.   7. GERD. Improved reflux symptoms with bid pantoprazole and qid sucralfate.,    Patient continue to be at high risk for worsening respiratory failure    Status is: Inpatient  Remains inpatient appropriate because:IV treatments appropriate due to intensity of illness or inability to take PO   Dispo: The patient is from: Home              Anticipated d/c is to: Home              Anticipated d/c date is: 3 days              Patient currently is not medically stable to d/c.    DVT prophylaxis: Enoxaparin   Code Status:   full  Family Communication:  I spoke over the phone with the patient's husband about patient's  condition, plan of care, prognosis and all questions were addressed.    Subjective: Patient  feeling better, but not yet back to her baseline, reflux better and now able to sleep at night.   Objective: Vitals:   03/27/20 1238 03/27/20 2007 03/27/20 2358 03/28/20 0411  BP: (!) 148/68 (!) 160/72 (!) 154/68 (!) 158/73  Pulse: (!) 48 (!) 50 63 61  Resp:      Temp: 97.8 F (36.6 C) 98.4 F (36.9 C) 98.3 F (36.8 C) 98.6 F (37 C)  TempSrc:  Oral Oral Oral Oral  SpO2: 95% 96% 97% 98%  Weight:    67.1 kg  Height:        Intake/Output Summary (Last 24 hours) at 03/28/2020 0709 Last data filed at 03/28/2020 3845 Gross per 24 hour  Intake 850 ml  Output 700 ml  Net 150 ml   Filed Weights   03/26/20 0416 03/27/20 0622 03/28/20 0411  Weight: 67.9 kg 67.5 kg 67.1 kg    Examination:   General: Not in pain or dyspnea, deconditioned  Neurology: Awake and alert, non focal  E ENT: no pallor, no icterus, oral mucosa moist Cardiovascular: No JVD. S1-S2 present, rhythmic, no gallops, rubs, or murmurs. No lower extremity edema. Pulmonary: positive breath sounds bilaterally,  Gastrointestinal. Abdomen soft and non tender Skin. No rashes Musculoskeletal: no joint deformities     Data Reviewed: I have personally reviewed following labs and imaging studies  CBC: Recent Labs  Lab 03/23/20 1509 03/23/20 2130 03/24/20 0431  WBC 7.8 5.8 5.8  NEUTROABS  --   --  4.7  HGB 14.3 13.3 13.1  HCT 41.5 39.6 37.7  MCV 95.6 96.4 95.9  PLT 272 256 252   Basic Metabolic Panel: Recent Labs  Lab 03/24/20 0431 03/25/20 0727 03/26/20 1045 03/27/20 0450 03/28/20 0529  NA 141 140 139 138 139  K 4.1 3.9 4.1 4.1 4.4  CL 102 102 102 104 103  CO2 26 26 25 26 27   GLUCOSE 155* 144* 157* 140* 132*  BUN 23 37* 42* 36* 33*  CREATININE 0.71 0.68 0.67 0.62 0.58  CALCIUM 8.5* 9.1 9.3 8.8* 9.2   GFR: Estimated Creatinine Clearance: 60.5 mL/min (by C-G formula based on SCr of 0.58 mg/dL). Liver Function Tests: Recent Labs  Lab 03/24/20 0431 03/25/20 0727 03/26/20 1045 03/27/20 0450 03/28/20 0529  AST 40 56* 47* 35 25  ALT 35 35 38 36 33  ALKPHOS 169* 168* 149* 125 116  BILITOT 0.9 0.8 1.0 0.9 0.8  PROT 6.9 6.8 6.5 6.0* 6.0*  ALBUMIN 3.0* 3.0* 3.1* 2.8* 2.8*   No results for input(s): LIPASE, AMYLASE in the last 168 hours. No results for input(s): AMMONIA in the last 168 hours. Coagulation Profile: No results for input(s): INR,  PROTIME in the last 168 hours. Cardiac Enzymes: No results for input(s): CKTOTAL, CKMB, CKMBINDEX, TROPONINI in the last 168 hours. BNP (last 3 results) No results for input(s): PROBNP in the last 8760 hours. HbA1C: No results for input(s): HGBA1C in the last 72 hours. CBG: No results for input(s): GLUCAP in the last 168 hours. Lipid Profile: No results for input(s): CHOL, HDL, LDLCALC, TRIG, CHOLHDL, LDLDIRECT in the last 72 hours. Thyroid Function Tests: No results for input(s): TSH, T4TOTAL, FREET4, T3FREE, THYROIDAB in the last 72 hours. Anemia Panel: Recent Labs    03/27/20 0450 03/28/20 0529  FERRITIN 474* 472*      Radiology Studies: I have reviewed all of the imaging during this hospital visit personally     Scheduled Meds: . vitamin C  500 mg Oral Daily  .  azelastine  1 spray Each Nare BID  . chlorpheniramine-HYDROcodone  5 mL Oral Q12H  . cholecalciferol  1,000 Units Oral Daily  . DULoxetine  60 mg Oral Daily  . enoxaparin (LOVENOX) injection  40 mg Subcutaneous Q24H  . fluticasone  1 spray Each Nare Daily  . gabapentin  200 mg Oral QHS  . Ipratropium-Albuterol  1 puff Inhalation QID  . levothyroxine  50 mcg Oral Q0600  . loratadine  10 mg Oral Daily  . methylPREDNISolone (SOLU-MEDROL) injection  1 mg/kg Intravenous Q12H  . pantoprazole  40 mg Oral BID  . sucralfate  1 g Oral TID WC & HS  . traZODone  50 mg Oral QHS  . zinc sulfate  220 mg Oral Daily   Continuous Infusions: . remdesivir 100 mg in NS 100 mL       LOS: 5 days        Marquies Wanat Gerome Apley, MD

## 2020-03-28 NOTE — Progress Notes (Signed)
Initial Nutrition Assessment  RD working remotely.  DOCUMENTATION CODES:   Not applicable  INTERVENTION:   - Magic Cup TID with meals, each supplement provides 290 kcal and 9 grams of protein  - Encourage adequate PO intake  NUTRITION DIAGNOSIS:   Increased nutrient needs related to acute illness (COVID-19 PNA) as evidenced by estimated needs.  GOAL:   Patient will meet greater than or equal to 90% of their needs  MONITOR:   PO intake,Supplement acceptance,Weight trends  REASON FOR ASSESSMENT:   Consult Assessment of nutrition requirement/status  ASSESSMENT:   70 year old female who presented to the ED on 12/30 with weakness. PMH of lymphoma s/p chemotherapy, hypothyroidism, latent TB, lupus. Pt admitted with COVID-19 pneumonia.   Spoke with pt via phone call to room. Pt reports decreased appetite and loss of taste over the last week or so PTA. Pt reports that she feels like she is just now getting some of her taste buds back.  Since admission, pt has only been able to eat light meals such as white toast with margarine and jelly. Discussed with pt the importance of adequate kcal and protein intake during acute illness to prevent dry weight loss and maintain lean muscle mass. Discussed increased nutrient needs related to COVID-19 pneumonia.  Pt does not like Ensure supplements but is willing to try Magic Cups with meals. RD took pt's dinner meal order (mashed potatoes and gravy, dinner roll, grapes, chocolate ice cream).  Pt reports that her UBW is 158 lbs. Noted current weight is 148 lbs. No weight history available in chart.  Meal Completion: 50% x 1 documented meal  Medications reviewed and include: vitamin C 500 mg daily, cholecalciferol, solu-medrol, protonix, sucralfate, zinc sulfate 220 mg daily  Labs reviewed.  NUTRITION - FOCUSED PHYSICAL EXAM:  Unable to complete at this time. RD working remotely.  Diet Order:   Diet Order            Diet regular Room  service appropriate? Yes; Fluid consistency: Thin  Diet effective now                 EDUCATION NEEDS:   Education needs have been addressed  Skin:  Skin Assessment: Reviewed RN Assessment  Last BM:  03/24/20  Height:   Ht Readings from Last 1 Encounters:  03/23/20 5\' 2"  (1.575 m)    Weight:   Wt Readings from Last 1 Encounters:  03/28/20 67.1 kg    BMI:  Body mass index is 27.07 kg/m.  Estimated Nutritional Needs:   Kcal:  1900-2100  Protein:  90-105 grams  Fluid:  1.9-2.1 L    05/26/20, MS, RD, LDN Inpatient Clinical Dietitian Please see AMiON for contact information.

## 2020-03-29 DIAGNOSIS — J1282 Pneumonia due to coronavirus disease 2019: Secondary | ICD-10-CM | POA: Diagnosis not present

## 2020-03-29 DIAGNOSIS — U071 COVID-19: Secondary | ICD-10-CM | POA: Diagnosis not present

## 2020-03-29 LAB — COMPREHENSIVE METABOLIC PANEL
ALT: 34 U/L (ref 0–44)
AST: 27 U/L (ref 15–41)
Albumin: 2.9 g/dL — ABNORMAL LOW (ref 3.5–5.0)
Alkaline Phosphatase: 108 U/L (ref 38–126)
Anion gap: 8 (ref 5–15)
BUN: 26 mg/dL — ABNORMAL HIGH (ref 8–23)
CO2: 26 mmol/L (ref 22–32)
Calcium: 9 mg/dL (ref 8.9–10.3)
Chloride: 102 mmol/L (ref 98–111)
Creatinine, Ser: 0.62 mg/dL (ref 0.44–1.00)
GFR, Estimated: >60 mL/min (ref >60)
Glucose, Bld: 132 mg/dL — ABNORMAL HIGH (ref 70–99)
Potassium: 4.4 mmol/L (ref 3.5–5.1)
Sodium: 136 mmol/L (ref 135–145)
Total Bilirubin: 0.9 mg/dL (ref 0.3–1.2)
Total Protein: 6.1 g/dL — ABNORMAL LOW (ref 6.5–8.1)

## 2020-03-29 LAB — C-REACTIVE PROTEIN: CRP: 0.9 mg/dL (ref <1.0)

## 2020-03-29 LAB — CBC
HCT: 41.1 % (ref 36.0–46.0)
Hemoglobin: 14.1 g/dL (ref 12.0–15.0)
MCH: 32.5 pg (ref 26.0–34.0)
MCHC: 34.3 g/dL (ref 30.0–36.0)
MCV: 94.7 fL (ref 80.0–100.0)
Platelets: 429 10*3/uL — ABNORMAL HIGH (ref 150–400)
RBC: 4.34 MIL/uL (ref 3.87–5.11)
RDW: 11.8 % (ref 11.5–15.5)
WBC: 12 10*3/uL — ABNORMAL HIGH (ref 4.0–10.5)
nRBC: 0 % (ref 0.0–0.2)

## 2020-03-29 LAB — D-DIMER, QUANTITATIVE: D-Dimer, Quant: 1.11 ug/mL-FEU — ABNORMAL HIGH (ref 0.00–0.50)

## 2020-03-29 LAB — FERRITIN: Ferritin: 485 ng/mL — ABNORMAL HIGH (ref 11–307)

## 2020-03-29 MED ORDER — SODIUM CHLORIDE 0.9% FLUSH
10.0000 mL | Freq: Two times a day (BID) | INTRAVENOUS | Status: DC
  Administered 2020-03-29 – 2020-04-03 (×10): 10 mL via INTRAVENOUS

## 2020-03-29 NOTE — Evaluation (Signed)
Occupational Therapy Evaluation Patient Details Name: Tara Cross MRN: [id number] DOB: 1950-11-24 Today's Date: 03/29/2020    History of Present Illness Unnamed Tara Cross is a 70 year old female past medical history of lymphoma status post chemotherapy, hypothyroidism, lupus, chronic back pain, intractable migraines, and latent TB (follow up with [facility] on 2018, re-test negative and recommended close observation. Patient declined any further TB meds).  Chest radiograph with bilateral lower lobes interstitial infiltrates, faint right upper lobe infiltrate.   Clinical Impression   Pt seen for OT evaluation this date in setting of acute hospitalization with COVID-19. Pt reports being INDEP at baseline. She presents this date with decreased fxl activity tolerance and strength. Pt requires CGA/MIN A for ADL transfers, MIN A for seated LB ADLs and SETUP for seated UB ADLs. OT educates pt re: energy conservation strategies and OT POC. Pt agreeable and puts forth good effort despite relatively high O2 needs (on 35% HHFNC). Will continue to follow acutely. Anticipate pt could benefit from [facility] f/u.     Follow Up Recommendations  Home health OT    Equipment Recommendations  Tub/shower seat    Recommendations for Other Services       Precautions / Restrictions Precautions Precautions: Fall Restrictions Weight Bearing Restrictions: No      Mobility Bed Mobility Overal bed mobility: Needs Assistance Bed Mobility: Sit to Supine       Sit to supine: Supervision   General bed mobility comments: increased time and use of bed rails. MOD A to propel towards [facility] with bed flattened.    Transfers Overall transfer level: Needs assistance Equipment used: 1 person hand held assist Transfers: Sit to/from [employer] Sit to Stand: Min guard Stand pivot transfers: Min guard;Min assist       General transfer comment: increased time, somewhat unsteady, but no gross LOB upon  CTS.    Balance Overall balance assessment: Needs assistance Sitting-balance support: Feet supported Sitting balance-Leahy Scale: Good       Standing balance-Leahy Scale: Fair Standing balance comment: reliant on UE support at least unilaterally                           ADL either performed or assessed with clinical judgement   ADL                                         General ADL Comments: Requires MIN A for LB ADLs d/t becoming SOB with bending over, SETUP for seated UB ADLs d/t limited tolerance for walking to gather her ADL items. CGA/MIN A for ADL transfers.     Vision Patient Visual Report: No change from baseline       Perception     Praxis      Pertinent Vitals/Pain Pain Assessment: No/denies pain     Hand Dominance Right   Extremity/Trunk Assessment Upper Extremity Assessment Upper Extremity Assessment: Generalized weakness   Lower Extremity Assessment Lower Extremity Assessment: Generalized weakness   Cervical / Trunk Assessment Cervical / Trunk Assessment: Normal   Communication Communication Communication: No difficulties   Cognition Arousal/Alertness: Lethargic;Awake/alert Behavior During Therapy: WFL for tasks assessed/performed Overall Cognitive Status: Within Functional Limits for tasks assessed  General Comments: pt is A&O and able to follow all commands/good reception of all information   General Comments       Exercises Other Exercises Other Exercises: OT educates pt re: safety with ADL transfers, importance of OOB activity and importance of increasing daily activity to improve tolerance with ADLs/ADL mobility. Pt with good understanding.   Shoulder Instructions      Home Living Family/patient expects to be discharged to:: Private residence Living Arrangements: Spouse/significant other Available Help at Discharge: Family;Available PRN/intermittently Type of  Home: Apartment Home Access: Stairs to enter Entrance Stairs-Number of Steps: 3rd floor apartment   Home Layout: One level               Home Equipment: None   Additional Comments: husband works second shift, would not be available to assist. Works at [employer], just tested positive for COVID today pt reports.      Prior Functioning/Environment          Comments: Pt modI for ADLs (sponge baths normally) huband assists with homemaking duties        OT Problem List: Decreased strength;Decreased activity tolerance;Decreased knowledge of use of DME or AE;Cardiopulmonary status limiting activity      OT Treatment/Interventions: Self-care/ADL training;DME and/or AE instruction;Therapeutic activities;Therapeutic exercise;Energy conservation;Patient/family education    OT Goals(Current goals can be found in the care plan section) Acute Rehab OT Goals Patient Stated Goal: to get back to her PLOF OT Goal Formulation: With patient Time For Goal Achievement: 04/12/20 Potential to Achieve Goals: Good ADL Goals Pt Will Perform Lower Body Dressing: with set-up;sit to/from stand (with AE as needed for EC) Pt Will Transfer to Toilet: with modified independence;ambulating (to BSC ~5-10' away (mindful of length of HHFNC)) Pt Will Perform Tub/Shower Transfer: Shower transfer;with supervision;shower seat (with LRAD) Pt/caregiver will Perform Home Exercise Program: Increased strength;Both right and left upper extremity;With Supervision Additional ADL Goal #1: pt will tolerate 3-4 min stand while completing an ADL or TA to increase fxl activity tolerance  OT Frequency: Min 1X/week   Barriers to D/C:            Co-evaluation              AM-PAC OT "6 Clicks" Daily Activity     Outcome Measure Help from another person eating meals?: None Help from another person taking care of personal grooming?: A Little Help from another person toileting, which includes using toliet, bedpan, or  urinal?: A Little Help from another person bathing (including washing, rinsing, drying)?: A Little Help from another person to put on and taking off regular upper body clothing?: None Help from another person to put on and taking off regular lower body clothing?: A Little 6 Click Score: 20   End of Session Equipment Utilized During Treatment: Oxygen (pt on HHFNC) Nurse Communication: Mobility status  Activity Tolerance: Patient tolerated treatment well Patient left: in bed;with call bell/phone within reach;with nursing/sitter in room (nurse present to give meds)  OT Visit Diagnosis: Unsteadiness on feet (R26.81);Muscle weakness (generalized) (M62.81)                Time: 1610-9604 OT Time Calculation (min): 37 min Charges:  OT General Charges $OT Visit: 1 Visit OT Evaluation $OT Eval Moderate Complexity: 1 Mod OT Treatments $Self Care/Home Management : 8-22 mins $Therapeutic Activity: 8-22 mins  Rejeana Brock, MS, OTR/L ascom [phone] 03/29/20, 7:02 PM

## 2020-03-29 NOTE — Plan of Care (Signed)
  Problem: Clinical Measurements: Goal: Ability to maintain clinical measurements within normal limits will improve Outcome: Progressing Goal: Respiratory complications will improve Outcome: Progressing   Problem: Activity: Goal: Risk for activity intolerance will decrease Outcome: Progressing   

## 2020-03-29 NOTE — Plan of Care (Signed)

## 2020-03-29 NOTE — Progress Notes (Signed)
Physical Therapy Treatment Patient Details Name: Tara Cross MRN: [id number] DOB: 21-Jan-1951 Today's Date: 03/29/2020    History of Present Illness Tara Cross is a 70 year old female past medical history of lymphoma status post chemotherapy, hypothyroidism, lupus, chronic back pain, intractable migraines, and latent TB (follow up with [facility] on 2018, re-test negative and recommended close observation. Patient declined any further TB meds).  Chest radiograph with bilateral lower lobes interstitial infiltrates, faint right upper lobe infiltrate.    PT Comments    Pt in bed upon entry agreeable to limited participation, reports recent transfer to/from [facility] with nursing and now fatigued. Educated on bed level HEP, pt only tolerating half due to fatigue, will review remain exercises at later date/time. Pt tolerates session fair in general, no increased pain, adequate rest breaks offered as needed. VSS during session. Pt requires max assist with bed mobility for repositioning. HEP texted to pt's phone.    Follow Up Recommendations  SNF     Equipment Recommendations  Other (comment)    Recommendations for Other Services       Precautions / Restrictions Precautions Precautions: Fall    Mobility  Bed Mobility Overal bed mobility: Needs Assistance             General bed mobility comments: maxA for scooting toward HOB; additional mobility defered dur to post transfer/toiletting fatigue  Transfers                    Ambulation/Gait                 Stairs             Wheelchair Mobility    Modified Rankin (Stroke Patients Only)       Balance                                            Cognition Arousal/Alertness: Lethargic Behavior During Therapy: WFL for tasks assessed/performed Overall Cognitive Status: Within Functional Limits for tasks assessed                                        Exercises General  Exercises - Lower Extremity Heel Slides: AROM;Supine;10 reps;Both Straight Leg Raises: AROM;10 reps;Supine;Both Other Exercises Other Exercises: isometric scapular retraction/shoulder extension 10x3sec H bilat Other Exercises: UE diagonal overheaed reach x10 bilat    General Comments        Pertinent Vitals/Pain Pain Assessment: No/denies pain    Home Living                      Prior Function            PT Goals (current goals can now be found in the care plan section) Acute Rehab PT Goals Patient Stated Goal: to get back to her PLOF PT Goal Formulation: With patient Time For Goal Achievement: 04/10/20 Potential to Achieve Goals: Good Progress towards PT goals: Not progressing toward goals - comment    Frequency    Min 2X/week      PT Plan Current plan remains appropriate    Co-evaluation              AM-PAC PT "6 Clicks" Mobility   Outcome Measure  Help needed turning from your back to  your side while in a flat bed without using bedrails?: A Little Help needed moving from lying on your back to sitting on the side of a flat bed without using bedrails?: A Little Help needed moving to and from a bed to a chair (including a wheelchair)?: A Little Help needed standing up from a chair using your arms (e.g., wheelchair or bedside chair)?: A Little Help needed to walk in hospital room?: A Lot Help needed climbing 3-5 steps with a railing? : A Lot 6 Click Score: 16    End of Session Equipment Utilized During Treatment: Oxygen Activity Tolerance: Patient limited by fatigue;Patient limited by lethargy Patient left: in bed;with call bell/phone within reach;with bed alarm set Nurse Communication: Mobility status PT Visit Diagnosis: Other abnormalities of gait and mobility (R26.89);Muscle weakness (generalized) (M62.81);Difficulty in walking, not elsewhere classified (R26.2)     Time: 6484-7207 PT Time Calculation (min) (ACUTE ONLY): 32 min  Charges:   $Therapeutic Exercise: 23-37 mins                     12:35 PM, 03/29/20 Rosamaria Lints, PT, DPT Physical Therapist - [facility]  [phone] (ASCOM)     Halena Mohar C 03/29/2020, 12:29 PM

## 2020-03-29 NOTE — Progress Notes (Signed)
PROGRESS NOTE    Tara Cross  VFI:[id number] DOB: 1950-06-14 DOA: 03/23/2020 PCP: Lolita Lenz, MD    Brief Narrative:  70 year old female past medical history of lymphoma status post chemotherapy, hypothyroidism, lupus and latent TB(follow up with [facility] on 2018, re-test negative and recommended close observation. Patient declined any further TB meds). S  Cc: Admitted for working diagnosis of acute hypoxic respiratory failure due to SARS COVID-19 viral pneumonia  Consultants:     Procedures:   Antimicrobials:       Subjective: Feels a little better. Still requiring 02. No cp, diarrhea.  Objective: Vitals:   03/29/20 1120 03/29/20 1131 03/29/20 1445 03/29/20 1546  BP: 95/64 115/70  123/68  Pulse: 66 66  74  Resp: 17   18  Temp: (!) 97.5 F (36.4 C)   97.9 F (36.6 C)  TempSrc: Oral   Oral  SpO2: 93%  92% 94%  Weight:      Height:        Intake/Output Summary (Last 24 hours) at 03/29/2020 1549 Last data filed at 03/29/2020 0546 Gross per 24 hour  Intake -  Output 250 ml  Net -250 ml   Filed Weights   03/27/20 0622 03/28/20 0411 03/29/20 0546  Weight: 67.5 kg 67.1 kg 67.5 kg    Examination:  General exam: Appears calm and comfortable  Respiratory system: rhonchi wheezing Cardiovascular system: S1 & S2 heard, RRR. No JVD, murmurs, rubs, gallops or clicks.  Gastrointestinal system: Abdomen is nondistended, soft and nontender. . Normal bowel sounds heard. Central nervous system: Alert and oriented.  Grossly intact Extremities: No edema Psychiatry:Mood & affect appropriate in current setting.     Data Reviewed: I have personally reviewed following labs and imaging studies  CBC: Recent Labs  Lab 03/23/20 1509 03/23/20 2130 03/24/20 0431 03/29/20 0537  WBC 7.8 5.8 5.8 12.0*  NEUTROABS  --   --  4.7  --   HGB 14.3 13.3 13.1 14.1  HCT 41.5 39.6 37.7 41.1  MCV 95.6 96.4 95.9 94.7  PLT 272 256 252 429*   Basic Metabolic Panel: Recent Labs   Lab 03/25/20 0727 03/26/20 1045 03/27/20 0450 03/28/20 0529 03/29/20 0537  NA 140 139 138 139 136  K 3.9 4.1 4.1 4.4 4.4  CL 102 102 104 103 102  CO2 26 25 26 27 26   GLUCOSE 144* 157* 140* 132* 132*  BUN 37* 42* 36* 33* 26*  CREATININE 0.68 0.67 0.62 0.58 0.62  CALCIUM 9.1 9.3 8.8* 9.2 9.0   GFR: Estimated Creatinine Clearance: 60.7 mL/min (by C-G formula based on SCr of 0.62 mg/dL). Liver Function Tests: Recent Labs  Lab 03/25/20 0727 03/26/20 1045 03/27/20 0450 03/28/20 0529 03/29/20 0537  AST 56* 47* 35 25 27  ALT 35 38 36 33 34  ALKPHOS 168* 149* 125 116 108  BILITOT 0.8 1.0 0.9 0.8 0.9  PROT 6.8 6.5 6.0* 6.0* 6.1*  ALBUMIN 3.0* 3.1* 2.8* 2.8* 2.9*   No results for input(s): LIPASE, AMYLASE in the last 168 hours. No results for input(s): AMMONIA in the last 168 hours. Coagulation Profile: No results for input(s): INR, PROTIME in the last 168 hours. Cardiac Enzymes: No results for input(s): CKTOTAL, CKMB, CKMBINDEX, TROPONINI in the last 168 hours. BNP (last 3 results) No results for input(s): PROBNP in the last 8760 hours. HbA1C: No results for input(s): HGBA1C in the last 72 hours. CBG: No results for input(s): GLUCAP in the last 168 hours. Lipid Profile: No results for  input(s): CHOL, HDL, LDLCALC, TRIG, CHOLHDL, LDLDIRECT in the last 72 hours. Thyroid Function Tests: No results for input(s): TSH, T4TOTAL, FREET4, T3FREE, THYROIDAB in the last 72 hours. Anemia Panel: Recent Labs    03/28/20 0529 03/29/20 0537  FERRITIN 472* 485*   Sepsis Labs: No results for input(s): PROCALCITON, LATICACIDVEN in the last 168 hours.  No results found for this or any previous visit (from the past 240 hour(s)).       Radiology Studies: No results found.      Scheduled Meds: . vitamin C  500 mg Oral Daily  . azelastine  1 spray Each Nare BID  . chlorpheniramine-HYDROcodone  5 mL Oral Q12H  . cholecalciferol  1,000 Units Oral Daily  . DULoxetine  60 mg  Oral Daily  . enoxaparin (LOVENOX) injection  40 mg Subcutaneous Q24H  . fluticasone  1 spray Each Nare Daily  . gabapentin  200 mg Oral QHS  . Ipratropium-Albuterol  1 puff Inhalation QID  . levothyroxine  50 mcg Oral Q0600  . loratadine  10 mg Oral Daily  . methylPREDNISolone (SOLU-MEDROL) injection  40 mg Intravenous Q12H  . pantoprazole  40 mg Oral BID  . sucralfate  1 g Oral TID WC & HS  . traZODone  50 mg Oral QHS  . zinc sulfate  220 mg Oral Daily   Continuous Infusions:  Assessment & Plan:   Principal Problem:   Pneumonia due to COVID-19 virus Active Problems:   SLE (systemic lupus erythematosus) (HCC)   Depression   Anxiety   Hypothyroidism   Allergic rhinitis   Bilateral sacroiliitis (HCC)   1.Acute hypoxic respiratory failure due to SARS COVID-19 viral pneumonia. Not able to add baricitinib due to history of latent TB. Sp remdesivir #5/5 Inflammatory markers decreasing.  CRP normal. Wean off of oxygen as tolerated Continue IV steroids and bronchodilators Flutter valve and incentive spirometer Keep O2 above 92%    2. Lupus/ scarolilitis.  No signs or symptoms of acute exacerbation    3. Hypothyroid. Continue levothyroxine  4. Depression. Continue Cymbalta, clonazepam, tizanidine    5. Latent TB.Old records personally reviewed, 2018 consultation with ID [facility], she tested negative QI, had treatment in [place] that did not tolerate. Patient declined any further TB meds.  Recommendation for close follow up.  6. Back pain. Chronic.continue withtramadoland naproxenas needed. On gabapentin qhs.  7. GERD.  Continue PPI and sucralfate        DVT prophylaxis: Lovenox Code Status: Full Family Communication: called and left a message on husband voicemail  Status is: Inpatient  Remains inpatient appropriate because:Inpatient level of care appropriate due to severity of illness   Dispo: The patient is from: Home               Anticipated d/c is to: Home              Anticipated d/c date is: 3 days              Patient currently is not medically stable to d/c.            LOS: 6 days   Time spent: 35 minutes with more than 50% on COC    Lynn Ito, MD [facility] Pager [phone]  If 7PM-7AM, please contact night-coverage 03/29/2020, 3:49 PM

## 2020-03-30 DIAGNOSIS — U071 COVID-19: Secondary | ICD-10-CM | POA: Diagnosis not present

## 2020-03-30 DIAGNOSIS — J1282 Pneumonia due to coronavirus disease 2019: Secondary | ICD-10-CM | POA: Diagnosis not present

## 2020-03-30 LAB — COMPREHENSIVE METABOLIC PANEL
ALT: 33 U/L (ref 0–44)
AST: 21 U/L (ref 15–41)
Albumin: 2.9 g/dL — ABNORMAL LOW (ref 3.5–5.0)
Alkaline Phosphatase: 101 U/L (ref 38–126)
Anion gap: 8 (ref 5–15)
BUN: 27 mg/dL — ABNORMAL HIGH (ref 8–23)
CO2: 28 mmol/L (ref 22–32)
Calcium: 8.8 mg/dL — ABNORMAL LOW (ref 8.9–10.3)
Chloride: 101 mmol/L (ref 98–111)
Creatinine, Ser: 0.59 mg/dL (ref 0.44–1.00)
GFR, Estimated: >60 mL/min (ref >60)
Glucose, Bld: 122 mg/dL — ABNORMAL HIGH (ref 70–99)
Potassium: 4.1 mmol/L (ref 3.5–5.1)
Sodium: 137 mmol/L (ref 135–145)
Total Bilirubin: 1 mg/dL (ref 0.3–1.2)
Total Protein: 6 g/dL — ABNORMAL LOW (ref 6.5–8.1)

## 2020-03-30 LAB — D-DIMER, QUANTITATIVE
D-Dimer, Quant: 0.91 ug/mL-FEU — ABNORMAL HIGH (ref 0.00–0.50)
D-Dimer, Quant: 0.93 ug/mL-FEU — ABNORMAL HIGH (ref 0.00–0.50)

## 2020-03-30 LAB — C-REACTIVE PROTEIN: CRP: <0.5 mg/dL (ref <1.0)

## 2020-03-30 LAB — FERRITIN: Ferritin: 710 ng/mL — ABNORMAL HIGH (ref 11–307)

## 2020-03-30 MED ORDER — SUCRALFATE 1 GM/10ML PO SUSP
1.0000 g | Freq: Three times a day (TID) | ORAL | Status: DC
  Administered 2020-04-02: 1 g via ORAL
  Filled 2020-03-30 (×19): qty 10

## 2020-03-30 NOTE — Progress Notes (Signed)
PROGRESS NOTE    Tara Cross  EUM:[id number] DOB: Jul 15, 1950 DOA: 03/23/2020 PCP: Lolita Lenz, MD    Brief Narrative:  70 year old female past medical history of lymphoma status post chemotherapy, hypothyroidism, lupus and latent TB(follow up with [facility] on 2018, re-test negative and recommended close observation. Patient declined any further TB meds). S  Cc: Admitted for working diagnosis of acute hypoxic respiratory failure due to SARS COVID-19 viral pneumonia  1/6-on HFNC   Consultants:     Procedures:   Antimicrobials:       Subjective: Reports sob mildly better. No cp.   Objective: Vitals:   03/29/20 1932 03/29/20 2013 03/30/20 0100 03/30/20 0513  BP: 121/71   111/78  Pulse: 80   65  Resp: 19   17  Temp: 97.8 F (36.6 C)   98 F (36.7 C)  TempSrc: Oral   Oral  SpO2: 92% 94% 95% 91%  Weight:    66.7 kg  Height:       No intake or output data in the 24 hours ending 03/30/20 0815 Filed Weights   03/28/20 0411 03/29/20 0546 03/30/20 0513  Weight: 67.1 kg 67.5 kg 66.7 kg    Examination: Calm, lying flat in bed Rhonchi b/l, no wheezing rrr s1/s2 Soft benign, +bs No edema Aaxox3, grossly intact     Data Reviewed: I have personally reviewed following labs and imaging studies  CBC: Recent Labs  Lab 03/23/20 1509 03/23/20 2130 03/24/20 0431 03/29/20 0537  WBC 7.8 5.8 5.8 12.0*  NEUTROABS  --   --  4.7  --   HGB 14.3 13.3 13.1 14.1  HCT 41.5 39.6 37.7 41.1  MCV 95.6 96.4 95.9 94.7  PLT 272 256 252 429*   Basic Metabolic Panel: Recent Labs  Lab 03/26/20 1045 03/27/20 0450 03/28/20 0529 03/29/20 0537 03/30/20 0510  NA 139 138 139 136 137  K 4.1 4.1 4.4 4.4 4.1  CL 102 104 103 102 101  CO2 25 26 27 26 28   GLUCOSE 157* 140* 132* 132* 122*  BUN 42* 36* 33* 26* 27*  CREATININE 0.67 0.62 0.58 0.62 0.59  CALCIUM 9.3 8.8* 9.2 9.0 8.8*   GFR: Estimated Creatinine Clearance: 60.2 mL/min (by C-G formula based on SCr of 0.59  mg/dL). Liver Function Tests: Recent Labs  Lab 03/26/20 1045 03/27/20 0450 03/28/20 0529 03/29/20 0537 03/30/20 0510  AST 47* 35 25 27 21   ALT 38 36 33 34 33  ALKPHOS 149* 125 116 108 101  BILITOT 1.0 0.9 0.8 0.9 1.0  PROT 6.5 6.0* 6.0* 6.1* 6.0*  ALBUMIN 3.1* 2.8* 2.8* 2.9* 2.9*   No results for input(s): LIPASE, AMYLASE in the last 168 hours. No results for input(s): AMMONIA in the last 168 hours. Coagulation Profile: No results for input(s): INR, PROTIME in the last 168 hours. Cardiac Enzymes: No results for input(s): CKTOTAL, CKMB, CKMBINDEX, TROPONINI in the last 168 hours. BNP (last 3 results) No results for input(s): PROBNP in the last 8760 hours. HbA1C: No results for input(s): HGBA1C in the last 72 hours. CBG: No results for input(s): GLUCAP in the last 168 hours. Lipid Profile: No results for input(s): CHOL, HDL, LDLCALC, TRIG, CHOLHDL, LDLDIRECT in the last 72 hours. Thyroid Function Tests: No results for input(s): TSH, T4TOTAL, FREET4, T3FREE, THYROIDAB in the last 72 hours. Anemia Panel: Recent Labs    03/29/20 0537 03/30/20 0510  FERRITIN 485* 710*   Sepsis Labs: No results for input(s): PROCALCITON, LATICACIDVEN in the last 168 hours.  No results found for this or any previous visit (from the past 240 hour(s)).       Radiology Studies: No results found.      Scheduled Meds: . vitamin C  500 mg Oral Daily  . azelastine  1 spray Each Nare BID  . chlorpheniramine-HYDROcodone  5 mL Oral Q12H  . cholecalciferol  1,000 Units Oral Daily  . DULoxetine  60 mg Oral Daily  . enoxaparin (LOVENOX) injection  40 mg Subcutaneous Q24H  . fluticasone  1 spray Each Nare Daily  . gabapentin  200 mg Oral QHS  . Ipratropium-Albuterol  1 puff Inhalation QID  . levothyroxine  50 mcg Oral Q0600  . loratadine  10 mg Oral Daily  . methylPREDNISolone (SOLU-MEDROL) injection  40 mg Intravenous Q12H  . pantoprazole  40 mg Oral BID  . sodium chloride flush  10 mL  Intravenous Q12H  . sucralfate  1 g Oral TID WC & HS  . traZODone  50 mg Oral QHS  . zinc sulfate  220 mg Oral Daily   Continuous Infusions:  Assessment & Plan:   Principal Problem:   Pneumonia due to COVID-19 virus Active Problems:   SLE (systemic lupus erythematosus) (HCC)   Depression   Anxiety   Hypothyroidism   Allergic rhinitis   Bilateral sacroiliitis (HCC)   1.Acute hypoxic respiratory failure due to SARS COVID-19 viral pneumonia. Not able to add baricitinib due to history of latent TB. Sp remdesivir #5/5 1/6-inflammatory markers decreasing \ CRP normal  We will try to wean off oxygen as tolerated  Continue IV steroids and bronchodilators  Continue flutter valve and incentive spirometer  Will check D-dimer and fibrin products    2. Lupus/ scarolilitis.  No signs or symptoms of acute exacerbation     3. Hypothyroid. Continue levothyroxine   4. Depression. Cymbalta, clonazepam, tizanidine    5. Latent TB.Old records personally reviewed, 2018 consultation with ID at [facility], she tested negative QI, had treatment in [place] that did not tolerate. Patient declined any further TB meds.  Recommendation for close follow up.  6. Back pain. Chronic.continue withtramadoland naproxenas needed. On gabapentin qhs.  7. GERD.  Continue PPI and sucralfate        DVT prophylaxis: Lovenox Code Status: Full Family Communication: called and left a message on husband voicemail  Status is: Inpatient  Remains inpatient appropriate because:Inpatient level of care appropriate due to severity of illness   Dispo: The patient is from: Home              Anticipated d/c is to: Home              Anticipated d/c date is: 3 days              Patient currently is not medically stable to d/c.            LOS: 7 days   Time spent: 35 minutes with more than 50% on COC    Lynn Ito, MD [facility] Pager [phone]  If 7PM-7AM, please  contact night-coverage 03/30/2020, 8:15 AM

## 2020-03-31 ENCOUNTER — Encounter: Payer: Self-pay | Admitting: Internal Medicine

## 2020-03-31 DIAGNOSIS — J1282 Pneumonia due to coronavirus disease 2019: Secondary | ICD-10-CM | POA: Diagnosis not present

## 2020-03-31 DIAGNOSIS — U071 COVID-19: Secondary | ICD-10-CM | POA: Diagnosis not present

## 2020-03-31 LAB — CBC
HCT: 41.1 % (ref 36.0–46.0)
Hemoglobin: 14 g/dL (ref 12.0–15.0)
MCH: 32.6 pg (ref 26.0–34.0)
MCHC: 34.1 g/dL (ref 30.0–36.0)
MCV: 95.6 fL (ref 80.0–100.0)
Platelets: 425 10*3/uL — ABNORMAL HIGH (ref 150–400)
RBC: 4.3 MIL/uL (ref 3.87–5.11)
RDW: 11.9 % (ref 11.5–15.5)
WBC: 10 10*3/uL (ref 4.0–10.5)
nRBC: 0 % (ref 0.0–0.2)

## 2020-03-31 LAB — COMPREHENSIVE METABOLIC PANEL
ALT: 30 U/L (ref 0–44)
AST: 17 U/L (ref 15–41)
Albumin: 3 g/dL — ABNORMAL LOW (ref 3.5–5.0)
Alkaline Phosphatase: 90 U/L (ref 38–126)
Anion gap: 9 (ref 5–15)
BUN: 30 mg/dL — ABNORMAL HIGH (ref 8–23)
CO2: 25 mmol/L (ref 22–32)
Calcium: 9 mg/dL (ref 8.9–10.3)
Chloride: 102 mmol/L (ref 98–111)
Creatinine, Ser: 0.67 mg/dL (ref 0.44–1.00)
GFR, Estimated: >60 mL/min (ref >60)
Glucose, Bld: 134 mg/dL — ABNORMAL HIGH (ref 70–99)
Potassium: 4 mmol/L (ref 3.5–5.1)
Sodium: 136 mmol/L (ref 135–145)
Total Bilirubin: 1.1 mg/dL (ref 0.3–1.2)
Total Protein: 6 g/dL — ABNORMAL LOW (ref 6.5–8.1)

## 2020-03-31 LAB — FERRITIN: Ferritin: 763 ng/mL — ABNORMAL HIGH (ref 11–307)

## 2020-03-31 LAB — D-DIMER, QUANTITATIVE: D-Dimer, Quant: 0.64 ug/mL-FEU — ABNORMAL HIGH (ref 0.00–0.50)

## 2020-03-31 LAB — C-REACTIVE PROTEIN: CRP: 0.6 mg/dL (ref <1.0)

## 2020-03-31 MED ORDER — POLYVINYL ALCOHOL 1.4 % OP SOLN
1.0000 [drp] | OPHTHALMIC | Status: DC | PRN
  Administered 2020-04-01: 1 [drp] via OPHTHALMIC
  Filled 2020-03-31 (×2): qty 15

## 2020-03-31 NOTE — Progress Notes (Signed)
Occupational Therapy Treatment Patient Details Name: Tara Cross MRN: [id number] DOB: Feb 24, 1951 Today's Date: 03/31/2020    History of present illness Tara Cross is a 70 year old female past medical history of lymphoma status post chemotherapy, hypothyroidism, lupus, chronic back pain, intractable migraines, and latent TB (follow up with [facility] on 2018, re-test negative and recommended close observation. Patient declined any further TB meds).  Chest radiograph with bilateral lower lobes interstitial infiltrates, faint right upper lobe infiltrate.   OT comments  Pt seen for OT treatment this date to f/u re: safety with ADLs/ADL mobility. OT facilitates pt participation in UB grooming tasks for 20 mins EOB sitting to improve fxl activity tolerance. Pt tolerates well, with brief periods of sats dropping to 89% on RA, but primarily maintains between 90 and 94% on RA during seated tasks. Pt requires SETUP to MIN A d/t decreasd fxl activity tolerance and at least 2 seated breaks to rest her arms during combing/brushing hair d/t fatigue. OT educates re: energy conservation and bed level core exercise to improve lung strength and quality of breaths. Pt with good understanding. Will continue to follow, continue to recommend HHOT f/u upon d/c.    Follow Up Recommendations  Home health OT    Equipment Recommendations  Tub/shower seat    Recommendations for Other Services      Precautions / Restrictions Precautions Precautions: Fall Restrictions Weight Bearing Restrictions: No       Mobility Bed Mobility Overal bed mobility: Needs Assistance Bed Mobility: Supine to Sit;Sit to Supine     Supine to sit: Min assist;HOB elevated Sit to supine: Min guard   General bed mobility comments: increased time, use of bedrails, MIN A to manage UB for sup to sit, able to manage LEs with extended time to get back to bed, but it is notably difficult for pt.  Transfers Overall transfer level: Needs  assistance Equipment used: Rolling walker (2 wheeled) Transfers: Sit to/from Stand Sit to Stand: Min assist         General transfer comment: deferred    Balance Overall balance assessment: Mild deficits observed, not formally tested;Modified Independent                                         ADL either performed or assessed with clinical judgement   ADL Overall ADL's : Needs assistance/impaired     Grooming: Wash/dry face;Oral care;Applying deodorant;Brushing hair;Set up;Sitting Grooming Details (indicate cue type and reason): sitting EOB x20 mins with Fair to Good static sitting balance and SETUP, on RA, maintaining O2 sats 89-94%. requires seated ~44min rest breaks per about Q7 mins of work                                     Vision Patient Visual Report: No change from baseline     Perception     Praxis      Cognition Arousal/Alertness: Awake/alert Behavior During Therapy: WFL for tasks assessed/performed Overall Cognitive Status: Within Functional Limits for tasks assessed                                          Exercises Other Exercises Other Exercises: OT facilitates pt participation in UB grooming  tasks for 20 mins EOB sitting to improve fxl activity tolerance. Pt tolerates well, with brief periods of sats dropping to 89% on RA, but primarily maintains between 90 and 94% on RA during seated tasks. Pt requires SETUP to MIN A d/t decreasd fxl activity tolerance and at least 2 seated breaks to rest her arms during combing/brushing hair d/t fatigue. OT educates re: energy conservation and bed level core exercise to improve lung strength and quality of breaths.   Shoulder Instructions       General Comments      Pertinent Vitals/ Pain       Pain Assessment: No/denies pain  Home Living                                          Prior Functioning/Environment              Frequency  Min  1X/week        Progress Toward Goals  OT Goals(current goals can now be found in the care plan section)  Progress towards OT goals: Progressing toward goals  Acute Rehab OT Goals Patient Stated Goal: to get back to her PLOF OT Goal Formulation: With patient Time For Goal Achievement: 04/12/20 Potential to Achieve Goals: Good  Plan Discharge plan remains appropriate;Frequency remains appropriate    Co-evaluation                 AM-PAC OT "6 Clicks" Daily Activity     Outcome Measure   Help from another person eating meals?: None Help from another person taking care of personal grooming?: A Little Help from another person toileting, which includes using toliet, bedpan, or urinal?: A Little Help from another person bathing (including washing, rinsing, drying)?: A Little Help from another person to put on and taking off regular upper body clothing?: None Help from another person to put on and taking off regular lower body clothing?: A Little 6 Click Score: 20    End of Session Equipment Utilized During Treatment: Oxygen  OT Visit Diagnosis: Unsteadiness on feet (R26.81);Muscle weakness (generalized) (M62.81)   Activity Tolerance Patient tolerated treatment well   Patient Left in bed;with call bell/phone within reach;with nursing/sitter in room   Nurse Communication Mobility status        Time: 2951-8841 OT Time Calculation (min): 42 min  Charges: OT General Charges $OT Visit: 1 Visit OT Treatments $Self Care/Home Management : 23-37 mins $Therapeutic Activity: 8-22 mins  Rejeana Brock, MS, OTR/L ascom [phone] 03/31/20, 5:08 PM

## 2020-03-31 NOTE — Progress Notes (Signed)
PROGRESS NOTE    Tara Cross  NWG:[id number] DOB: 05/12/1950 DOA: 03/23/2020 PCP: Lolita Lenz, MD    Brief Narrative:  70 year old female past medical history of lymphoma status post chemotherapy, hypothyroidism, lupus and latent TB(follow up with [facility] on 2018, re-test negative and recommended close observation. Patient declined any further TB meds). S  Cc: Admitted for working diagnosis of acute hypoxic respiratory failure due to SARS COVID-19 viral pneumonia  1/6-on HFNC   1/7-HF down to 6L [place] . Starting feeling better.  Consultants:     Procedures:   Antimicrobials:       Subjective: Feels like starting to feel better. Not at baseline yet. No cp.  Objective: Vitals:   03/30/20 1625 03/30/20 1628 03/30/20 2031 03/31/20 0420  BP: 118/63  139/76 121/77  Pulse: 75  76 67  Resp: 19  20 20   Temp: 98 F (36.7 C)  97.7 F (36.5 C) 97.9 F (36.6 C)  TempSrc: Oral  Oral Oral  SpO2: 94% 94% 98% 96%  Weight:    64.6 kg  Height:        Intake/Output Summary (Last 24 hours) at 03/31/2020 0843 Last data filed at 03/31/2020 0429 Gross per 24 hour  Intake --  Output 600 ml  Net -600 ml   Filed Weights   03/29/20 0546 03/30/20 0513 03/31/20 0420  Weight: 67.5 kg 66.7 kg 64.6 kg    Examination: nad nontachypnic Mild rhonchi b/l no w Regular s1s2 Soft benign +bs No edema aaxox3     Data Reviewed: I have personally reviewed following labs and imaging studies  CBC: Recent Labs  Lab 03/29/20 0537  WBC 12.0*  HGB 14.1  HCT 41.1  MCV 94.7  PLT 429*   Basic Metabolic Panel: Recent Labs  Lab 03/27/20 0450 03/28/20 0529 03/29/20 0537 03/30/20 0510 03/31/20 0535  NA 138 139 136 137 136  K 4.1 4.4 4.4 4.1 4.0  CL 104 103 102 101 102  CO2 26 27 26 28 25   GLUCOSE 140* 132* 132* 122* 134*  BUN 36* 33* 26* 27* 30*  CREATININE 0.62 0.58 0.62 0.59 0.67  CALCIUM 8.8* 9.2 9.0 8.8* 9.0   GFR: Estimated Creatinine Clearance: 59.4 mL/min (by C-G  formula based on SCr of 0.67 mg/dL). Liver Function Tests: Recent Labs  Lab 03/27/20 0450 03/28/20 0529 03/29/20 0537 03/30/20 0510 03/31/20 0535  AST 35 25 27 21 17   ALT 36 33 34 33 30  ALKPHOS 125 116 108 101 90  BILITOT 0.9 0.8 0.9 1.0 1.1  PROT 6.0* 6.0* 6.1* 6.0* 6.0*  ALBUMIN 2.8* 2.8* 2.9* 2.9* 3.0*   No results for input(s): LIPASE, AMYLASE in the last 168 hours. No results for input(s): AMMONIA in the last 168 hours. Coagulation Profile: No results for input(s): INR, PROTIME in the last 168 hours. Cardiac Enzymes: No results for input(s): CKTOTAL, CKMB, CKMBINDEX, TROPONINI in the last 168 hours. BNP (last 3 results) No results for input(s): PROBNP in the last 8760 hours. HbA1C: No results for input(s): HGBA1C in the last 72 hours. CBG: No results for input(s): GLUCAP in the last 168 hours. Lipid Profile: No results for input(s): CHOL, HDL, LDLCALC, TRIG, CHOLHDL, LDLDIRECT in the last 72 hours. Thyroid Function Tests: No results for input(s): TSH, T4TOTAL, FREET4, T3FREE, THYROIDAB in the last 72 hours. Anemia Panel: Recent Labs    03/29/20 0537 03/30/20 0510  FERRITIN 485* 710*   Sepsis Labs: No results for input(s): PROCALCITON, LATICACIDVEN in the last 168  hours.  No results found for this or any previous visit (from the past 240 hour(s)).       Radiology Studies: No results found.      Scheduled Meds: . vitamin C  500 mg Oral Daily  . azelastine  1 spray Each Nare BID  . chlorpheniramine-HYDROcodone  5 mL Oral Q12H  . cholecalciferol  1,000 Units Oral Daily  . DULoxetine  60 mg Oral Daily  . enoxaparin (LOVENOX) injection  40 mg Subcutaneous Q24H  . fluticasone  1 spray Each Nare Daily  . gabapentin  200 mg Oral QHS  . Ipratropium-Albuterol  1 puff Inhalation QID  . levothyroxine  50 mcg Oral Q0600  . loratadine  10 mg Oral Daily  . methylPREDNISolone (SOLU-MEDROL) injection  40 mg Intravenous Q12H  . pantoprazole  40 mg Oral BID  .  sodium chloride flush  10 mL Intravenous Q12H  . sucralfate  1 g Oral TID WC & HS  . traZODone  50 mg Oral QHS  . zinc sulfate  220 mg Oral Daily   Continuous Infusions:  Assessment & Plan:   Principal Problem:   Pneumonia due to COVID-19 virus Active Problems:   SLE (systemic lupus erythematosus) (HCC)   Depression   Anxiety   Hypothyroidism   Allergic rhinitis   Bilateral sacroiliitis (HCC)   1.Acute hypoxic respiratory failure due to SARS COVID-19 viral pneumonia. Not able to add baricitinib due to history of latent TB. Sp remdesivir #5/5 1/6-inflammatory markers decreasing \ CRP normal  1/7-improving. Down to 6L , will continue to wean down Continue iv steroid infl markers decreased IS   2. Lupus/ scarolilitis.  No s/sx of acute exacerbation     3. Hypothyroid. Continue levothyroxine   4. Depression. Cymbalta, clonazepam, tizanidine    5. Latent TB.Old records personally reviewed, 2018 consultation with ID at [facility], she tested negative QI, had treatment in [place] that did not tolerate. Patient declined any further TB meds.  Recommendation for close follow up.  6. Back pain. Chronic.continue withtramadoland naproxenas needed. On gabapentin qhs.  7. GERD.  Continue PPI and sucralfate        DVT prophylaxis: Lovenox Code Status: Full Family Communication: called and left a message on husband voicemail  Status is: Inpatient  Remains inpatient appropriate because:Inpatient level of care appropriate due to severity of illness   Dispo: The patient is from: Home              Anticipated d/c is to: Home              Anticipated d/c date is: 3 days              Patient currently is not medically stable to d/c.            LOS: 8 days   Time spent: 35 minutes with more than 50% on COC    Lynn Ito, MD [facility] Pager [phone]  If 7PM-7AM, please contact night-coverage 03/31/2020, 8:43 AM Patient ID: Tara Cross, female   DOB: [birth year], 70 y.o.   MRN: [id number]

## 2020-03-31 NOTE — Progress Notes (Addendum)
Physical Therapy Treatment Patient Details Name: Tara Cross MRN: [id number] DOB: 12/08/1950 Today's Date: 03/31/2020    History of Present Illness Angelique Chevalier is a 70 year old female past medical history of lymphoma status post chemotherapy, hypothyroidism, lupus, chronic back pain, intractable migraines, and latent TB (follow up with [facility] on 2018, re-test negative and recommended close observation. Patient declined any further TB meds).  Chest radiograph with bilateral lower lobes interstitial infiltrates, faint right upper lobe infiltrate.    PT Comments    the patient in bed upon entry, now off [place], received on 4L [place]. Pt hesitant but eventually agreeable to OOB mobility this session, continues to be heavily limited by substantial fatigue, malaise. Pt also with progressive 'wooziness' of the head whilst vertical and upright. Pt able to AMB from EOB to toilet (76f) in 2 minutes, then back. SpO2, BP remain flat. Pt left up in chair at end of session. Pt expresses feelings of gratitude that she made the attempt to mobilize as she did. Pt confident in ability of AMB 26 stairs back into her apartment at [place], however, this is the first date pt has even attempted AMB and was limited to 833fover 2-3 minutes. No evidence available to support the patient's ability to withstand this level of exertion. SNF recs remain the safest and most reasonable recommendation at this time.   *addendum: [place]or [place] to pt's husband on telephone, gave updates on patient's mobility status, confirmed his ability/desire to provide physical assistance at home at [place]. Husband understands that pt's mobility is very limited. Husband expresses desire to hear from attending MD whom he has not been able to make contact with since prior attending. Husband concerned regarding pt report that she has not gotten her dinner until after 7:00pm on two occasions, concerned pt's nutrition is not adequately being met. Husbands concerns are  acknowledged. On phone 17:10-17:20, 03/31/20  5:26 PM, 03/31/20 AlEtta GrandchildPT, DPT Physical Therapist - [phone]    Follow Up Recommendations  SNF     Equipment Recommendations  Other (comment)    Recommendations for Other Services       Precautions / Restrictions Precautions Precautions: Fall Restrictions Weight Bearing Restrictions: No    Mobility  Bed Mobility Overal bed mobility: Needs Assistance Bed Mobility: Supine to Sit     Supine to sit: Mod assist     General bed mobility comments: weak, difficulty moving  Transfers Overall transfer level: Needs assistance Equipment used: Rolling walker (2 wheeled) Transfers: Sit to/from Stand Sit to Stand: Min assist         General transfer comment: better from EOB, but needs minA from commoder  Ambulation/Gait Ambulation/Gait assistance: Min guard Gait Distance (Feet): 8 Feet ([id number]) Assistive device: Rolling walker (2 wheeled)       General Gait Details: very slow, weak appearing, reports to be "woozie" each time she is up. No frank desaturation or hypotension (BP taken after AMB in stting); AMB to BR, then back to recliner. .   Stairs             Wheelchair Mobility    Modified Rankin (Stroke Patients Only)       Balance Overall balance assessment: Mild deficits observed, not formally tested;Modified Independent  Cognition Arousal/Alertness:  (awake but malaise continues) Behavior During Therapy: WFL for tasks assessed/performed Overall Cognitive Status: Within Functional Limits for tasks assessed                                        Exercises      General Comments        Pertinent Vitals/Pain Pain Assessment: No/denies pain    Home Living                      Prior Function            PT Goals (current goals can now be  found in the care plan section) Acute Rehab PT Goals Patient Stated Goal: to get back to her PLOF PT Goal Formulation: With patient Time For Goal Achievement: 04/10/20 Potential to Achieve Goals: Fair Progress towards PT goals: Progressing toward goals    Frequency    Min 2X/week      PT Plan Current plan remains appropriate    Co-evaluation              AM-PAC PT "6 Clicks" Mobility   Outcome Measure  Help needed turning from your back to your side while in a flat bed without using bedrails?: A Little Help needed moving from lying on your back to sitting on the side of a flat bed without using bedrails?: A Little Help needed moving to and from a bed to a chair (including a wheelchair)?: A Little Help needed standing up from a chair using your arms (e.g., wheelchair or bedside chair)?: A Little Help needed to walk in hospital room?: A Lot Help needed climbing 3-5 steps with a railing? : A Lot 6 Click Score: 16    End of Session Equipment Utilized During Treatment: Oxygen Activity Tolerance: Patient limited by fatigue;Patient limited by lethargy Patient left: with call bell/phone within reach;with bed alarm set;in chair Nurse Communication: Mobility status PT Visit Diagnosis: Other abnormalities of gait and mobility (R26.89);Muscle weakness (generalized) (M62.81);Difficulty in walking, not elsewhere classified (R26.2)     Time: 1610-9604 PT Time Calculation (min) (ACUTE ONLY): 35 min  Charges:  $Therapeutic Exercise: 8-22 mins $Therapeutic Activity: 8-22 mins                     3:52 PM, 03/31/20 Etta Grandchild, PT, DPT Physical Therapist - [facility]  [phone] ([place])    [place] C 03/31/2020, 3:47 PM

## 2020-04-01 DIAGNOSIS — U071 COVID-19: Secondary | ICD-10-CM | POA: Diagnosis not present

## 2020-04-01 DIAGNOSIS — J1282 Pneumonia due to coronavirus disease 2019: Secondary | ICD-10-CM | POA: Diagnosis not present

## 2020-04-01 NOTE — Progress Notes (Addendum)
PROGRESS NOTE    Tara Cross  FBP:[id number] DOB: [birth year] DOA: 03/23/2020 PCP: Lolita Lenz, MD    Brief Narrative:  70 year old female past medical history of lymphoma status post chemotherapy, hypothyroidism, lupus and latent TB(follow up with [facility] on 2018, re-test negative and recommended close observation. Patient declined any further TB meds).   Cc: Admitted for working diagnosis of acute hypoxic respiratory failure due to SARS COVID-19 viral pneumonia. Has been weaned down from HF to 1-2 L [place] now.Needs SNF.  1/6-on HFNC   1/7-HF down to 6L [place] . Starting feeling better. 1/8-O2 down to 1-2 L. PT rec. SNF   Consultants:     Procedures:   Antimicrobials:       Subjective: Feeling better, less sob. Not sure how much sob has with ambulation. Nervous about ambulating, no cp   Objective: Vitals:   03/31/20 1705 03/31/20 2002 04/01/20 0432 04/01/20 0730  BP: 124/74 115/64 115/82 139/78  Pulse: 72 81 61 (!) 52  Resp: 18 18 17 20   Temp: (!) 97.5 F (36.4 C) (!) 97.5 F (36.4 C) 97.7 F (36.5 C) 97.6 F (36.4 C)  TempSrc: Oral Axillary Oral Oral  SpO2: 96% 94% 96% 95%  Weight:   65.6 kg   Height:       No intake or output data in the 24 hours ending 04/01/20 0831 Filed Weights   03/30/20 0513 03/31/20 0420 04/01/20 0432  Weight: 66.7 kg 64.6 kg 65.6 kg    Examination: Nad, comfortable Mild rhonchi overall improving no wheezing Regular S1-S2 Soft benign No edema Alert oriented x3    Data Reviewed: I have personally reviewed following labs and imaging studies  CBC: Recent Labs  Lab 03/29/20 0537 03/31/20 0535  WBC 12.0* 10.0  HGB 14.1 14.0  HCT 41.1 41.1  MCV 94.7 95.6  PLT 429* 425*   Basic Metabolic Panel: Recent Labs  Lab 03/27/20 0450 03/28/20 0529 03/29/20 0537 03/30/20 0510 03/31/20 0535  NA 138 139 136 137 136  K 4.1 4.4 4.4 4.1 4.0  CL 104 103 102 101 102  CO2 26 27 26 28 25   GLUCOSE 140* 132* 132* 122* 134*  BUN  36* 33* 26* 27* 30*  CREATININE 0.62 0.58 0.62 0.59 0.67  CALCIUM 8.8* 9.2 9.0 8.8* 9.0   GFR: Estimated Creatinine Clearance: 59.8 mL/min (by C-G formula based on SCr of 0.67 mg/dL). Liver Function Tests: Recent Labs  Lab 03/27/20 0450 03/28/20 0529 03/29/20 0537 03/30/20 0510 03/31/20 0535  AST 35 25 27 21 17   ALT 36 33 34 33 30  ALKPHOS 125 116 108 101 90  BILITOT 0.9 0.8 0.9 1.0 1.1  PROT 6.0* 6.0* 6.1* 6.0* 6.0*  ALBUMIN 2.8* 2.8* 2.9* 2.9* 3.0*   No results for input(s): LIPASE, AMYLASE in the last 168 hours. No results for input(s): AMMONIA in the last 168 hours. Coagulation Profile: No results for input(s): INR, PROTIME in the last 168 hours. Cardiac Enzymes: No results for input(s): CKTOTAL, CKMB, CKMBINDEX, TROPONINI in the last 168 hours. BNP (last 3 results) No results for input(s): PROBNP in the last 8760 hours. HbA1C: No results for input(s): HGBA1C in the last 72 hours. CBG: No results for input(s): GLUCAP in the last 168 hours. Lipid Profile: No results for input(s): CHOL, HDL, LDLCALC, TRIG, CHOLHDL, LDLDIRECT in the last 72 hours. Thyroid Function Tests: No results for input(s): TSH, T4TOTAL, FREET4, T3FREE, THYROIDAB in the last 72 hours. Anemia Panel: Recent Labs    03/30/20  0510 03/31/20 0535  FERRITIN 710* 763*   Sepsis Labs: No results for input(s): PROCALCITON, LATICACIDVEN in the last 168 hours.  No results found for this or any previous visit (from the past 240 hour(s)).       Radiology Studies: No results found.      Scheduled Meds: . vitamin C  500 mg Oral Daily  . azelastine  1 spray Each Nare BID  . chlorpheniramine-HYDROcodone  5 mL Oral Q12H  . cholecalciferol  1,000 Units Oral Daily  . DULoxetine  60 mg Oral Daily  . enoxaparin (LOVENOX) injection  40 mg Subcutaneous Q24H  . fluticasone  1 spray Each Nare Daily  . gabapentin  200 mg Oral QHS  . Ipratropium-Albuterol  1 puff Inhalation QID  . levothyroxine  50 mcg  Oral Q0600  . loratadine  10 mg Oral Daily  . methylPREDNISolone (SOLU-MEDROL) injection  40 mg Intravenous Q12H  . pantoprazole  40 mg Oral BID  . sodium chloride flush  10 mL Intravenous Q12H  . sucralfate  1 g Oral TID WC & HS  . traZODone  50 mg Oral QHS  . zinc sulfate  220 mg Oral Daily   Continuous Infusions:  Assessment & Plan:   Principal Problem:   Pneumonia due to COVID-19 virus Active Problems:   SLE (systemic lupus erythematosus) (HCC)   Depression   Anxiety   Hypothyroidism   Allergic rhinitis   Bilateral sacroiliitis (HCC)   1.Acute hypoxic respiratory failure due to SARS COVID-19 viral pneumonia. Not able to add baricitinib due to history of latent TB. Sp remdesivir #5/5 1/8 clinically improving. With OT with ambulation 89% on RA. CRP nml Inflammatory markers have decreased Continue iv steroid, can switch to po taper in am IS, flutter valve    2. Lupus/ scarolilitis.  No signs or symptoms of acute exacerbation     3. Hypothyroid. Continue levothyroxine  4. Depression. Continue Cymbalta, clonazepam, tizanidine    5. Latent TB.Old records personally reviewed, 2018 consultation with ID at [facility], she tested negative QI, had treatment in [place] that did not tolerate. Patient declined any further TB meds.  Recommendation for close follow up.  6. Back pain. Chronic.continue withtramadoland naproxenas needed. On gabapentin qhs.  7. GERD.  Continue PPI and sucralfate        DVT prophylaxis: Lovenox Code Status: Full Family Communication: Updated husband.  He is aware that patient requires SNF Status is: Inpatient  Remains inpatient appropriate because:Inpatient level of care appropriate due to severity of illness   Dispo: The patient is from: Home              Anticipated d/c is to: SNF              Anticipated d/c date is: 1-2 days              Patient currently is not medically stable to d/c.             LOS: 9  days   Time spent: 35 minutes with more than 50% on COC    Lynn Ito, MD [facility] Pager [phone]  If 7PM-7AM, please contact night-coverage 04/01/2020, 8:31 AM

## 2020-04-01 NOTE — Progress Notes (Signed)
Physical Therapy Treatment Patient Details Name: Tara Cross MRN: [id number] DOB: [birth year] Today's Date: 04/01/2020    History of Present Illness Tara Cross is a 70 year old female past medical history of lymphoma status post chemotherapy, hypothyroidism, lupus, chronic back pain, intractable migraines, and latent TB (follow up with [facility] on 2018, re-test negative and recommended close observation. Patient declined any further TB meds).  Chest radiograph with bilateral lower lobes interstitial infiltrates, faint right upper lobe infiltrate.    PT Comments    Pt in bed upon entry agreeable to session. Pt reports attempting more AMB to BR with nursing since prior PT session, less wooziness, more confidence. Pt AMB to BR, modI in toileting and clean up. Pt AMB continuously for 7 minutes prior to onset of performance limiting fatigue, covers 7ft in room, no device needed, SpO2 remains >91%. Pt denies any dyspnea, no coughing. Pt left at EOB sipping ice water at EOS, encouraged to sit EOB for 10-15 minutes since she is not interested in going to recliner today. All session performed at supervision level or less assistance. I still feel that pt would maximize her recovery by STR at DC, particularly as she has 26 stairs to enter thehome, however, she and husband are not willing to entertain a rehab stay and both espouse confidence in the functional obstacle that is the stairs at home. Will touch base with husband today to relay updates and updated recommendations for DC to home with HHPT.  Follow Up Recommendations  Home health PT;Supervision - Intermittent     Equipment Recommendations  Rolling walker with 5" wheels;Other (comment) (pt may refuse as she does not like walkers)    Recommendations for Other Services       Precautions / Restrictions Precautions Precautions: Fall Restrictions Weight Bearing Restrictions: No    Mobility  Bed Mobility Overal bed mobility: Modified  Independent Bed Mobility: Supine to Sit           General bed mobility comments: labored, but no physical assist needed.  Transfers Overall transfer level: Needs assistance Equipment used: None (grab bar in BR) Transfers: Sit to/from Stand Sit to Stand: Supervision         General transfer comment: improve from yesterday, no LOB  Ambulation/Gait Ambulation/Gait assistance: Supervision Gait Distance (Feet): 85 Feet Assistive device: None   Gait velocity: In all, 21ft in room takes 7 minutes; but minimal woozieness today, VSS on 2L. Pt reports unable to go farther.   General Gait Details: very slow, but steady intermittent hands on fixed objected for stability; Gregarious disdain for the RW as it summons distance memories of postoperative strife.   Stairs             Wheelchair Mobility    Modified Rankin (Stroke Patients Only)       Balance Overall balance assessment: Modified Independent;Mild deficits observed, not formally tested                                          Cognition Arousal/Alertness: Awake/alert Behavior During Therapy: WFL for tasks assessed/performed Overall Cognitive Status: Within Functional Limits for tasks assessed                                        Exercises      General Comments  Pertinent Vitals/Pain Pain Assessment: No/denies pain    Home Living                      Prior Function            PT Goals (current goals can now be found in the care plan section) Acute Rehab PT Goals Patient Stated Goal: to get back to her PLOF PT Goal Formulation: With patient Time For Goal Achievement: 04/10/20 Potential to Achieve Goals: Fair Progress towards PT goals: Progressing toward goals    Frequency    Min 2X/week      PT Plan Discharge plan needs to be updated;Equipment recommendations need to be updated    Co-evaluation              AM-PAC PT "6  Clicks" Mobility   Outcome Measure  Help needed turning from your back to your side while in a flat bed without using bedrails?: A Little Help needed moving from lying on your back to sitting on the side of a flat bed without using bedrails?: A Little Help needed moving to and from a bed to a chair (including a wheelchair)?: A Little Help needed standing up from a chair using your arms (e.g., wheelchair or bedside chair)?: A Little Help needed to walk in hospital room?: A Little Help needed climbing 3-5 steps with a railing? : A Little 6 Click Score: 18    End of Session Equipment Utilized During Treatment: Oxygen Activity Tolerance: Patient limited by fatigue;Patient limited by lethargy Patient left: with call bell/phone within reach;in bed Nurse Communication: Mobility status PT Visit Diagnosis: Other abnormalities of gait and mobility (R26.89);Muscle weakness (generalized) (M62.81);Difficulty in walking, not elsewhere classified (R26.2)     Time: 1017-5102 PT Time Calculation (min) (ACUTE ONLY): 27 min  Charges:  $Gait Training: 8-22 mins $Therapeutic Exercise: 8-22 mins                     5:00 PM, 04/01/20 Rosamaria Lints, PT, DPT Physical Therapist - [facility]  [phone] (ASCOM)    Boen Sterbenz C 04/01/2020, 4:57 PM

## 2020-04-02 ENCOUNTER — Encounter: Payer: Self-pay | Admitting: Internal Medicine

## 2020-04-02 DIAGNOSIS — U071 COVID-19: Secondary | ICD-10-CM | POA: Diagnosis not present

## 2020-04-02 DIAGNOSIS — M461 Sacroiliitis, not elsewhere classified: Secondary | ICD-10-CM | POA: Diagnosis not present

## 2020-04-02 DIAGNOSIS — J1282 Pneumonia due to coronavirus disease 2019: Secondary | ICD-10-CM | POA: Diagnosis not present

## 2020-04-02 DIAGNOSIS — J309 Allergic rhinitis, unspecified: Secondary | ICD-10-CM | POA: Diagnosis not present

## 2020-04-02 DIAGNOSIS — F419 Anxiety disorder, unspecified: Secondary | ICD-10-CM | POA: Diagnosis not present

## 2020-04-02 MED ORDER — DEXTROMETHORPHAN-GUAIFENESIN 10 MG-100 MG/5 ML ORAL SYRUP
ORAL | 0 days
Start: 2020-04-02 — End: ?

## 2020-04-02 MED ORDER — PREDNISONE 20 MG PO TABS
20.0000 mg | ORAL_TABLET | Freq: Every day | ORAL | Status: DC
  Administered 2020-04-03: 20 mg via ORAL
  Filled 2020-04-02: qty 1

## 2020-04-02 MED ORDER — ALBUTEROL SULFATE HFA 108 (90 BASE) MCG/ACT IN AERS: 2.0000 | INHALATION_SPRAY | RESPIRATORY_TRACT | 0 refills | Status: AC | PRN

## 2020-04-02 MED ORDER — GUAIFENESIN-DM 100-10 MG/5ML PO SYRP: 10.0000 mL | ORAL_SOLUTION | Freq: Four times a day (QID) | ORAL | 0 refills | Status: AC | PRN

## 2020-04-02 NOTE — Progress Notes (Signed)
SATURATION QUALIFICATIONS: (This note is used to comply with regulatory documentation for home oxygen)  Patient Saturations on Room Air at Rest = 88%  Patient Saturations on Room Air while Ambulating = na%  Patient Saturations on 2 Liters of oxygen while Ambulating = 93%  Please briefly explain why patient needs home oxygen:

## 2020-04-02 NOTE — Discharge Summary (Addendum)
Physician Discharge Summary  Tara Cross PVX:[id number] DOB: May 16, 1950 DOA: 03/23/2020  PCP: Tara Lenz, MD  Admit date: 03/23/2020 Discharge date: 04/02/2020  Admitted From: Home  Disposition:  Home   Recommendations for Outpatient Follow-up and new medication changes:  1. Follow up with  Dr. Rose Cross in 2 weeks.  2. Continue self quarantine for 2 more weeks, maintain physical distancing and use a mask in public.   I spoke over the phone with the patient's husbnad about patient's  condition, plan of care, prognosis and all questions were addressed.  Home Health: yes   Equipment/Devices: home 02   Discharge Condition: stable  CODE STATUS: full  Diet recommendation: heart healthy   Brief/Interim Summary: Tara Cross admitted to the hospital with the working diagnosis of acute hypoxic respiratory failure due to SARS COVID-19 viral pneumonia.  70 year old female past medical history of lymphoma status post chemotherapy, hypothyroidism, lupus and latent TB(follow up with [facility] on 2018, re-test negative and recommended close observation. Patient declined any further TB meds). She reported several days of not feeling well, on her initial physical examination she was afebrile, heart rate 85, respiratory rate 19, oxygen saturation 87% on room air, her lungs had decreased breath sounds bilaterally, heart S1-S2, present, loud P2, abdomen soft nontender, no lower extremity edema.  Sodium 138, potassium 4.2, chloride 95, bicarb 26, glucose 106, BUN 25, creatinine 0.97, troponin I 22-23, white count 7.8, hemoglobin 14.3, hematocrit 41.5, platelets 272.  SARS COVID-19 positive.  Chest radiograph with bilateral lower lobesinterstitial infiltrates, faint right upper lobe infiltrate.  EKG 89 bpm, normal axis, normal intervals, sinus rhythm, no ST segment changes, negative T waves in lead III.  Patient was placed on remdesivir and systemic steroids, not able to use baricitinib due to  hx of latent TB.  Patient on high oxygen requirements per heated high flow nasal cannula. Slowly improving, but very weak and deconditioned  Eventually her oxygen requirements had decreased to less than 5 L/min regular case nasal cannula, her physical functional capacity has been improving.  Patient is beeing discharged on supplemental oxygen and home health services.  1.  Acute hypoxic respiratory failure due to SARS COVID-19 viral pneumonia.    Patient had a prolonged hospital stay, she responded well to medical therapy with systemic corticosteroids and remdesivir.  Baricitinib was not used due to history of latent TB.  She was treated with bronchodilator therapy, antitussive agents and airway clearing techniques. Physical therapy/Occupational Therapy evaluation.  Her inflammatory markers, symptoms and oxygenation improved.  COVID-19 Labs  Recent Labs    03/30/20 1548 03/31/20 0535  DDIMER 0.93* 0.64*  FERRITIN  --  763*  CRP  --  0.6    No results found for: SARSCOV2NAA  She will be discharged with home health services and supplemental oxygen, follow-up with primary care provider in 2 weeks.  2.  Lupus/sacroiliitis.  No signs of acute exacerbation.  3.  Hypothyroid.  She was continued on levothyroxine.  4.  Depression.  Patient was continued on Cymbalta, clonazepam and tizanidine with no major complications.  5. Latent TB.Old records personally reviewed, 2018 consultation with ID at [facility], she tested negative QI, had treatment in [place] that did not tolerate. Patient declined any further TB meds.  Recommendation for close follow up.  6.  Chronic back pain.  She received analgesia with tramadol and naproxen.  Continue gabapentin.  7.  GERD.  She required pantoprazole twice daily and daily sucralfate with improvement of her symptoms.  Discharge  Diagnoses:  Principal Problem:   Pneumonia due to COVID-19 virus Active Problems:   SLE (systemic lupus erythematosus)  (HCC)   Depression   Anxiety   Hypothyroidism   Allergic rhinitis   Bilateral sacroiliitis (HCC)    Discharge Instructions   Allergies as of 04/02/2020      Reactions   Penicillins    Sulfa Antibiotics    Toradol [ketorolac Tromethamine]       Medication List    STOP taking these medications   levofloxacin 500 MG tablet Commonly known as: LEVAQUIN     TAKE these medications   acetaminophen 500 MG tablet Commonly known as: TYLENOL Take 500-1,000 mg by mouth every 6 (six) hours as needed for mild pain, moderate pain or fever.   albuterol 108 (90 Base) MCG/ACT inhaler Commonly known as: VENTOLIN HFA Inhale 2 puffs into the lungs every 4 (four) hours as needed for wheezing or shortness of breath.   Cholecalciferol 25 MCG (1000 UT) capsule Take 1,000 Units by mouth daily.   clonazePAM 1 MG tablet Commonly known as: KLONOPIN Take 1 mg by mouth at bedtime as needed (sleep).   DULoxetine 60 MG capsule Commonly known as: CYMBALTA Take 60 mg by mouth daily.   Emgality 120 MG/ML Sosy Generic drug: Galcanezumab-gnlm Inject 120 mg into the skin every 30 (thirty) days.   gabapentin 100 MG capsule Commonly known as: NEURONTIN Take 200 mg by mouth at bedtime.   guaiFENesin-dextromethorphan 100-10 MG/5ML syrup Commonly known as: ROBITUSSIN DM Take 10 mLs by mouth every 6 (six) hours as needed for cough.   hydrochlorothiazide 25 MG tablet Commonly known as: HYDRODIURIL Take 12.5 mg by mouth every evening.   levocetirizine 5 MG tablet Commonly known as: XYZAL Take 5 mg by mouth daily.   levothyroxine 50 MCG tablet Commonly known as: SYNTHROID Take 50 mcg by mouth daily before breakfast.   naproxen 500 MG tablet Commonly known as: NAPROSYN Take 500 mg by mouth 2 (two) times daily as needed for mild pain or moderate pain.   omeprazole 40 MG capsule Commonly known as: PRILOSEC Take 40 mg by mouth daily.   tiZANidine 2 MG tablet Commonly known as: ZANAFLEX Take  2 mg by mouth every 8 (eight) hours as needed for muscle spasms.   traMADol 50 MG tablet Commonly known as: ULTRAM Take 25-50 mg by mouth every 6 (six) hours as needed for moderate pain.   traZODone 50 MG tablet Commonly known as: DESYREL Take 50 mg by mouth at bedtime.            Durable Medical Equipment  (From admission, onward)         Start     Ordered   04/02/20 1312  For home use only DME oxygen  Once       Question Answer Comment  Length of Need 6 Months   Mode or (Route) Nasal cannula   Liters per Minute 2   Frequency Continuous (stationary and portable oxygen unit needed)   Oxygen conserving device Yes   Oxygen delivery system Gas      04/02/20 1311          Allergies  Allergen Reactions  . Penicillins   . Sulfa Antibiotics   . Toradol [Ketorolac Tromethamine]      Procedures/Studies: DG Chest 2 View  Result Date: 03/23/2020 CLINICAL DATA:  Shortness of breath EXAM: CHEST - 2 VIEW COMPARISON:  None. FINDINGS: Patchy bilateral mid to lower lung airspace opacities. No pleural effusion.  Mild cardiomegaly. No pneumothorax. Moderate hiatal hernia IMPRESSION: Patchy bilateral mid to lower lung airspace opacities suspicious for multifocal pneumonia, possible atypical or viral pneumonia. Hiatal hernia. Electronically Signed   By: Jasmine Pang M.D.   On: 03/23/2020 15:57        Subjective: Patient is feeling better, dyspnea has been improving, along with overall strength. No nausea or vomiting.   Discharge Exam: Vitals:   04/02/20 0921 04/02/20 1223  BP: (!) 134/59 120/62  Pulse: 61 67  Resp: 18 17  Temp: 97.6 F (36.4 C) 98.2 F (36.8 C)  SpO2: 94% 94%   Vitals:   04/02/20 0908 04/02/20 0909 04/02/20 0921 04/02/20 1223  BP:   (!) 134/59 120/62  Pulse:   61 67  Resp:   18 17  Temp:   97.6 F (36.4 C) 98.2 F (36.8 C)  TempSrc:   Oral Oral  SpO2: (!) 88% 93% 94% 94%  Weight:      Height:        General: Not in pain or dyspnea.   Neurology: Awake and alert, non focal  E ENT: no pallor, no icterus, oral mucosa moist Cardiovascular: No JVD. S1-S2 present, rhythmic, no gallops, rubs, or murmurs. No lower extremity edema. Pulmonary: positive breath sounds bilaterally, no wheezing Gastrointestinal. Abdomen  Soft and non tender 'Skin. No rashes Musculoskeletal: no joint deformities   The results of significant diagnostics from this hospitalization (including imaging, microbiology, ancillary and laboratory) are listed below for reference.     Microbiology: No results found for this or any previous visit (from the past 240 hour(s)).   Labs: BNP (last 3 results) No results for input(s): BNP in the last 8760 hours. Basic Metabolic Panel: Recent Labs  Lab 03/27/20 0450 03/28/20 0529 03/29/20 0537 03/30/20 0510 03/31/20 0535  NA 138 139 136 137 136  K 4.1 4.4 4.4 4.1 4.0  CL 104 103 102 101 102  CO2 26 27 26 28 25   GLUCOSE 140* 132* 132* 122* 134*  BUN 36* 33* 26* 27* 30*  CREATININE 0.62 0.58 0.62 0.59 0.67  CALCIUM 8.8* 9.2 9.0 8.8* 9.0   Liver Function Tests: Recent Labs  Lab 03/27/20 0450 03/28/20 0529 03/29/20 0537 03/30/20 0510 03/31/20 0535  AST 35 25 27 21 17   ALT 36 33 34 33 30  ALKPHOS 125 116 108 101 90  BILITOT 0.9 0.8 0.9 1.0 1.1  PROT 6.0* 6.0* 6.1* 6.0* 6.0*  ALBUMIN 2.8* 2.8* 2.9* 2.9* 3.0*   No results for input(s): LIPASE, AMYLASE in the last 168 hours. No results for input(s): AMMONIA in the last 168 hours. CBC: Recent Labs  Lab 03/29/20 0537 03/31/20 0535  WBC 12.0* 10.0  HGB 14.1 14.0  HCT 41.1 41.1  MCV 94.7 95.6  PLT 429* 425*   Cardiac Enzymes: No results for input(s): CKTOTAL, CKMB, CKMBINDEX, TROPONINI in the last 168 hours. BNP: Invalid input(s): POCBNP CBG: No results for input(s): GLUCAP in the last 168 hours. D-Dimer Recent Labs    03/30/20 1548 03/31/20 0535  DDIMER 0.93* 0.64*   Hgb A1c No results for input(s): HGBA1C in the last 72  hours. Lipid Profile No results for input(s): CHOL, HDL, LDLCALC, TRIG, CHOLHDL, LDLDIRECT in the last 72 hours. Thyroid function studies No results for input(s): TSH, T4TOTAL, T3FREE, THYROIDAB in the last 72 hours.  Invalid input(s): FREET3 Anemia work up Recent Labs    03/31/20 0535  FERRITIN 763*   Urinalysis No results found for: COLORURINE, APPEARANCEUR, LABSPEC, PHURINE,  GLUCOSEU, HGBUR, BILIRUBINUR, KETONESUR, PROTEINUR, UROBILINOGEN, NITRITE, LEUKOCYTESUR Sepsis Labs Invalid input(s): PROCALCITONIN,  WBC,  LACTICIDVEN Microbiology No results found for this or any previous visit (from the past 240 hour(s)).   Time coordinating discharge: 45 minutes  SIGNED:   Coralie Keens, MD  [facility] 04/02/2020, 1:16 PM

## 2020-04-03 DIAGNOSIS — U071 COVID-19: Secondary | ICD-10-CM | POA: Diagnosis not present

## 2020-04-03 DIAGNOSIS — J1282 Pneumonia due to coronavirus disease 2019: Secondary | ICD-10-CM | POA: Diagnosis not present

## 2020-04-03 NOTE — Progress Notes (Signed)
70 year old female past medical history of lymphoma status post chemotherapy, hypothyroidism, lupus and latent TB(follow up with [facility] on 2018, re-test negative and recommended close observation. Patient declined any further TB meds). She reported several days of not feeling well, She was found to be positive for COVID-19. Treated with remdesivir and steroid.  Completed the course. Initially requiring more oxygen, wean down to 4 to 5 L.  She was discharged yesterday on home oxygen.  Everything was in her room.  Do not know why she did not left.  Patient was seen today with no complaint.  She was waiting for her husband to come pick her up.  Continue with the original discharge.

## 2020-04-03 NOTE — Progress Notes (Signed)
One tank of oxygen delievered to the patient. The patient stated she doesn't have any oxygen at home. Message sent to [facility] regarding more oxygen getting sent to the patients home.

## 2020-04-03 NOTE — TOC Initial Note (Addendum)
Transition of Care [facility]) - Initial/Assessment Note    Patient Details  Name: Tara Cross MRN: [id number] Date of Birth: Oct 30, 1950  Transition of Care [facility]) CM/SW Contact:    Maree Krabbe, LCSW Phone Number: 04/03/2020, 10:45 AM  Clinical Narrative:                 Pt lives at home with spouse. Pt is COVID +. CSW unable to reach pt in the room. [facility] has offered to service pt. Pt will get HH PT, OT, and RN at d/c. 02, walker, and shower chair all ordered through [employer] and will be delivered at bedside prior to d/c.  Expected Discharge Plan: Home w Home Health Services Barriers to Discharge: No Barriers Identified   Patient Goals and CMS Choice        Expected Discharge Plan and Services Expected Discharge Plan: Home w Home Health Services In-house Referral: Clinical Social Work   Post Acute Care Choice: Home Health Living arrangements for the past 2 months: Single Family Home                           HH Arranged: RN,PT,OT HH Agency: [facility] Date HH Agency Contacted: 04/03/20 Time HH Agency Contacted: 1045 Representative spoke with at [facility] Agency: brittany  Prior Living Arrangements/Services Living arrangements for the past 2 months: Single Family Home Lives with:: Spouse Patient language and need for interpreter reviewed:: Yes Do you feel safe going back to the place where you live?: Yes      Need for Family Participation in Patient Care: Yes (Comment) Care giver support system in place?: Yes (comment)   Criminal Activity/Legal Involvement Pertinent to Current Situation/Hospitalization: No - Comment as needed  Activities of Daily Living Home Assistive Devices/Equipment: None ADL Screening (condition at time of admission) Patient's cognitive ability adequate to safely complete daily activities?: Yes Is the patient deaf or have difficulty hearing?: No Does the patient have difficulty seeing, even when wearing glasses/contacts?: No Does the patient  have difficulty concentrating, remembering, or making decisions?: No Patient able to express need for assistance with ADLs?: Yes Does the patient have difficulty dressing or bathing?: No Independently performs ADLs?: Yes (appropriate for developmental age) Communication: Independent Dressing (OT): Independent Grooming: Independent Feeding: Independent Bathing: Independent Toileting: Independent In/Out Bed: Needs assistance Is this a change from baseline?: Pre-admission baseline Walks in Home: Independent Does the patient have difficulty walking or climbing stairs?: No Weakness of Legs: None Weakness of Arms/Hands: None  Permission Sought/Granted Permission sought to share information with : Family Supports    Share Information with NAME: Fayrene Fearing     Permission granted to share info w Relationship: spouse     Emotional Assessment Appearance:: Appears stated age     Orientation: : Oriented to Place,Oriented to Self,Oriented to  Time,Oriented to Situation Alcohol / Substance Use: Not Applicable Psych Involvement: No (comment)  Admission diagnosis:  Weakness [R53.1] Hypoxia [R09.02] COVID [U07.1] Pneumonia due to COVID-19 virus [U07.1, J12.82] Patient Active Problem List   Diagnosis Date Noted  . Pneumonia due to COVID-19 virus 03/23/2020  . SLE (systemic lupus erythematosus) (HCC) 03/23/2020  . Depression 03/23/2020  . Anxiety 03/23/2020  . Hypothyroidism 03/23/2020  . Allergic rhinitis 03/23/2020  . Bilateral sacroiliitis (HCC) 03/23/2020   PCP:  Lolita Lenz, MD Pharmacy:   [facility] [phone] [address] Rough, [facility] - [address] [place] [address] Phone: [phone] Fax: [phone]  Social Determinants of Health (SDOH) Interventions    Readmission Risk Interventions No flowsheet data found.

## 2020-04-03 NOTE — Progress Notes (Signed)
PT Cancellation Note  Patient Details Name: Tara Cross MRN: [id number] DOB: [birth year]   Cancelled Treatment:    Reason Eval/Treat Not Completed: Patient declined, no reason specified (Pt preparing for imminent DC to home within the next 2 hours.Author stopped in to see if pt desired to review any of her PT actvity prior to DC.) Pt denies any needs at this time, reports confidence in ability to access home environment upon DC. Time offered to address pt's questions. Will hold treatment at this time as not to interfere with DC progress.     Olga Bourbeau C 04/03/2020, 1:04 PM

## 2020-04-21 ENCOUNTER — Institutional Professional Consult (permissible substitution)
Admit: 2020-04-21 | Discharge: 2020-04-22 | Payer: MEDICARE | Attending: Student in an Organized Health Care Education/Training Program | Primary: Student in an Organized Health Care Education/Training Program

## 2020-04-25 ENCOUNTER — Ambulatory Visit: Admit: 2020-04-25 | Discharge: 2020-04-26 | Payer: MEDICARE

## 2020-04-25 DIAGNOSIS — Z8616 History of COVID-19: Principal | ICD-10-CM

## 2020-04-25 DIAGNOSIS — R0789 Other chest pain: Principal | ICD-10-CM

## 2020-04-25 DIAGNOSIS — I10 Essential (primary) hypertension: Principal | ICD-10-CM

## 2020-05-09 DIAGNOSIS — J1282 Pneumonia due to COVID-19 virus: Principal | ICD-10-CM

## 2020-05-09 DIAGNOSIS — U071 Pneumonia due to COVID-19 virus: Principal | ICD-10-CM

## 2020-05-15 MED FILL — EMGALITY PEN 120 MG/ML SUBCUTANEOUS PEN INJECTOR: SUBCUTANEOUS | 30 days supply | Qty: 1 | Fill #1

## 2020-05-19 MED ORDER — NAPROXEN 500 MG TABLET
ORAL_TABLET | Freq: Two times a day (BID) | ORAL | 11 refills | 30 days | Status: CP
Start: 2020-05-19 — End: ?

## 2020-05-23 ENCOUNTER — Ambulatory Visit: Admit: 2020-05-23 | Discharge: 2020-05-24 | Payer: MEDICARE

## 2020-05-23 DIAGNOSIS — M797 Fibromyalgia: Principal | ICD-10-CM

## 2020-05-23 DIAGNOSIS — F419 Anxiety disorder, unspecified: Principal | ICD-10-CM

## 2020-05-23 DIAGNOSIS — R5383 Other fatigue: Principal | ICD-10-CM

## 2020-05-23 DIAGNOSIS — Z8616 History of COVID-19: Principal | ICD-10-CM

## 2020-05-23 DIAGNOSIS — R251 Tremor, unspecified: Principal | ICD-10-CM

## 2020-05-23 DIAGNOSIS — M3219 Other organ or system involvement in systemic lupus erythematosus: Principal | ICD-10-CM

## 2020-05-23 DIAGNOSIS — R0600 Dyspnea, unspecified: Principal | ICD-10-CM

## 2020-05-23 DIAGNOSIS — R638 Other symptoms and signs concerning food and fluid intake: Principal | ICD-10-CM

## 2020-05-30 ENCOUNTER — Encounter
Admit: 2020-05-30 | Discharge: 2020-05-30 | Payer: MEDICARE | Attending: Student in an Organized Health Care Education/Training Program | Primary: Student in an Organized Health Care Education/Training Program

## 2020-05-30 ENCOUNTER — Ambulatory Visit: Admit: 2020-05-30 | Discharge: 2020-05-30 | Payer: MEDICARE

## 2020-05-30 MED ORDER — OFLOXACIN 0.3 % EYE DROPS
Freq: Four times a day (QID) | OPHTHALMIC | 1 refills | 25 days | Status: CP
Start: 2020-05-30 — End: 2020-06-24
  Filled 2020-05-30: qty 5, 25d supply, fill #0

## 2020-05-30 MED ORDER — PREDNISOLONE ACETATE 1 % EYE DROPS,SUSPENSION
Freq: Four times a day (QID) | OPHTHALMIC | 1 refills | 25 days | Status: CP
Start: 2020-05-30 — End: 2020-06-29
  Filled 2020-05-30: qty 5, 25d supply, fill #0

## 2020-05-31 ENCOUNTER — Ambulatory Visit: Admit: 2020-05-31 | Discharge: 2020-06-01 | Payer: MEDICARE

## 2020-05-31 DIAGNOSIS — Z9889 Other specified postprocedural states: Principal | ICD-10-CM

## 2020-06-07 MED FILL — EMGALITY PEN 120 MG/ML SUBCUTANEOUS PEN INJECTOR: SUBCUTANEOUS | 30 days supply | Qty: 1 | Fill #2

## 2020-06-14 ENCOUNTER — Institutional Professional Consult (permissible substitution): Admit: 2020-06-14 | Discharge: 2020-06-14 | Payer: MEDICARE

## 2020-06-14 ENCOUNTER — Ambulatory Visit: Admit: 2020-06-14 | Discharge: 2020-06-14 | Payer: MEDICARE

## 2020-06-14 MED ORDER — OMEPRAZOLE 40 MG CAPSULE,DELAYED RELEASE
ORAL_CAPSULE | Freq: Every day | ORAL | 3 refills | 90.00000 days | Status: CP
Start: 2020-06-14 — End: 2020-06-14

## 2020-06-14 MED ORDER — TRAMADOL 50 MG TABLET
ORAL_TABLET | Freq: Four times a day (QID) | ORAL | 0 refills | 5 days | Status: CP | PRN
Start: 2020-06-14 — End: ?

## 2020-06-21 ENCOUNTER — Encounter: Admit: 2020-06-21 | Discharge: 2020-06-21 | Payer: MEDICARE | Attending: Anesthesiology | Primary: Anesthesiology

## 2020-06-21 ENCOUNTER — Ambulatory Visit: Admit: 2020-06-21 | Discharge: 2020-06-21 | Payer: MEDICARE

## 2020-06-21 MED ORDER — OFLOXACIN 0.3 % EYE DROPS
0 refills | 0 days | Status: CP
Start: 2020-06-21 — End: ?
  Filled 2020-06-21: qty 5, 25d supply, fill #0

## 2020-06-21 MED ORDER — PREDNISOLONE ACETATE 1 % EYE DROPS,SUSPENSION
1 refills | 0 days | Status: CP
Start: 2020-06-21 — End: ?
  Filled 2020-06-21: qty 5, 25d supply, fill #0

## 2020-06-22 ENCOUNTER — Ambulatory Visit: Admit: 2020-06-22 | Discharge: 2020-06-23 | Payer: MEDICARE

## 2020-06-22 DIAGNOSIS — Z9889 Other specified postprocedural states: Principal | ICD-10-CM

## 2020-07-04 ENCOUNTER — Ambulatory Visit: Admit: 2020-07-04 | Discharge: 2020-07-05 | Payer: MEDICARE

## 2020-07-04 DIAGNOSIS — B028 Zoster with other complications: Principal | ICD-10-CM

## 2020-07-04 DIAGNOSIS — B0229 Other postherpetic nervous system involvement: Principal | ICD-10-CM

## 2020-07-04 DIAGNOSIS — R251 Tremor, unspecified: Principal | ICD-10-CM

## 2020-07-04 DIAGNOSIS — M797 Fibromyalgia: Principal | ICD-10-CM

## 2020-07-04 MED ORDER — TRAMADOL 50 MG TABLET
ORAL_TABLET | Freq: Four times a day (QID) | ORAL | 0 refills | 10.00000 days | Status: CP | PRN
Start: 2020-07-04 — End: ?

## 2020-07-04 MED ORDER — GABAPENTIN 100 MG CAPSULE
ORAL_CAPSULE | ORAL | 3 refills | 0.00000 days | Status: CP
Start: 2020-07-04 — End: ?

## 2020-07-04 MED ORDER — VALACYCLOVIR 1 GRAM TABLET
ORAL_TABLET | Freq: Three times a day (TID) | ORAL | 0 refills | 4.00000 days | Status: CP
Start: 2020-07-04 — End: 2020-07-04

## 2020-07-04 MED ORDER — GABAPENTIN 300 MG CAPSULE
ORAL_CAPSULE | ORAL | 3 refills | 0.00000 days | Status: CP
Start: 2020-07-04 — End: ?

## 2020-07-05 ENCOUNTER — Ambulatory Visit: Admit: 2020-07-05 | Discharge: 2020-07-06 | Payer: MEDICARE

## 2020-07-05 DIAGNOSIS — Z9889 Other specified postprocedural states: Principal | ICD-10-CM

## 2020-07-11 DIAGNOSIS — M3219 Other organ or system involvement in systemic lupus erythematosus: Principal | ICD-10-CM

## 2020-07-18 MED FILL — EMGALITY PEN 120 MG/ML SUBCUTANEOUS PEN INJECTOR: SUBCUTANEOUS | 30 days supply | Qty: 1 | Fill #3

## 2020-07-25 ENCOUNTER — Institutional Professional Consult (permissible substitution): Admit: 2020-07-25 | Discharge: 2020-07-25 | Payer: MEDICARE

## 2020-07-25 ENCOUNTER — Ambulatory Visit: Admit: 2020-07-25 | Discharge: 2020-07-25 | Payer: MEDICARE

## 2020-07-25 DIAGNOSIS — M3219 Other organ or system involvement in systemic lupus erythematosus: Principal | ICD-10-CM

## 2020-07-25 DIAGNOSIS — L659 Nonscarring hair loss, unspecified: Principal | ICD-10-CM

## 2020-08-01 DIAGNOSIS — D472 Monoclonal gammopathy: Principal | ICD-10-CM

## 2020-08-11 DIAGNOSIS — J301 Allergic rhinitis due to pollen: Principal | ICD-10-CM

## 2020-08-11 MED ORDER — LEVOCETIRIZINE 5 MG TABLET
ORAL_TABLET | Freq: Every day | ORAL | 3 refills | 90 days | Status: CP
Start: 2020-08-11 — End: ?

## 2020-08-16 DIAGNOSIS — Z1239 Encounter for other screening for malignant neoplasm of breast: Principal | ICD-10-CM

## 2020-08-16 MED ORDER — TRAMADOL 50 MG TABLET
ORAL_TABLET | Freq: Three times a day (TID) | ORAL | 5 refills | 14.00000 days | Status: CP | PRN
Start: 2020-08-16 — End: ?

## 2020-08-22 ENCOUNTER — Ambulatory Visit: Admit: 2020-08-22 | Discharge: 2020-08-22 | Payer: MEDICARE

## 2020-08-22 DIAGNOSIS — Z79899 Other long term (current) drug therapy: Principal | ICD-10-CM

## 2020-08-22 DIAGNOSIS — H40003 Preglaucoma, unspecified, bilateral: Principal | ICD-10-CM

## 2020-08-22 DIAGNOSIS — M3219 Other organ or system involvement in systemic lupus erythematosus: Principal | ICD-10-CM

## 2020-08-22 DIAGNOSIS — D472 Monoclonal gammopathy: Principal | ICD-10-CM

## 2020-08-28 MED FILL — EMGALITY PEN 120 MG/ML SUBCUTANEOUS PEN INJECTOR: SUBCUTANEOUS | 30 days supply | Qty: 1 | Fill #4

## 2020-09-05 ENCOUNTER — Ambulatory Visit: Admit: 2020-09-05 | Discharge: 2020-09-06 | Payer: MEDICARE

## 2020-09-05 ENCOUNTER — Institutional Professional Consult (permissible substitution): Admit: 2020-09-05 | Discharge: 2020-09-06 | Payer: MEDICARE

## 2020-09-05 DIAGNOSIS — B0229 Other postherpetic nervous system involvement: Principal | ICD-10-CM

## 2020-09-05 DIAGNOSIS — M797 Fibromyalgia: Principal | ICD-10-CM

## 2020-09-05 DIAGNOSIS — IMO0002 Chronic migraine: Principal | ICD-10-CM

## 2020-09-05 DIAGNOSIS — R251 Tremor, unspecified: Principal | ICD-10-CM

## 2020-09-05 MED ORDER — PROPRANOLOL 10 MG TABLET
ORAL_TABLET | Freq: Two times a day (BID) | ORAL | 11 refills | 30.00000 days | Status: CP | PRN
Start: 2020-09-05 — End: 2021-09-05

## 2020-09-05 MED ORDER — GABAPENTIN 100 MG CAPSULE
ORAL_CAPSULE | Freq: Three times a day (TID) | ORAL | 3 refills | 90.00000 days | Status: CP | PRN
Start: 2020-09-05 — End: ?

## 2020-10-12 MED FILL — EMGALITY PEN 120 MG/ML SUBCUTANEOUS PEN INJECTOR: SUBCUTANEOUS | 30 days supply | Qty: 1 | Fill #5

## 2020-10-17 ENCOUNTER — Ambulatory Visit: Admit: 2020-10-17 | Discharge: 2020-10-18 | Payer: MEDICARE

## 2020-10-17 ENCOUNTER — Institutional Professional Consult (permissible substitution): Admit: 2020-10-17 | Discharge: 2020-10-18 | Payer: MEDICARE

## 2020-10-17 DIAGNOSIS — M533 Sacrococcygeal disorders, not elsewhere classified: Principal | ICD-10-CM

## 2020-10-31 DIAGNOSIS — Z1231 Encounter for screening mammogram for malignant neoplasm of breast: Principal | ICD-10-CM

## 2020-11-14 MED FILL — EMGALITY PEN 120 MG/ML SUBCUTANEOUS PEN INJECTOR: SUBCUTANEOUS | 30 days supply | Qty: 1 | Fill #6

## 2020-11-22 DIAGNOSIS — R251 Tremor, unspecified: Principal | ICD-10-CM

## 2020-11-22 DIAGNOSIS — G43809 Other migraine, not intractable, without status migrainosus: Principal | ICD-10-CM

## 2020-11-23 ENCOUNTER — Institutional Professional Consult (permissible substitution): Admit: 2020-11-23 | Discharge: 2020-11-24 | Payer: MEDICARE

## 2020-11-28 MED ORDER — GABAPENTIN 300 MG CAPSULE
ORAL_CAPSULE | Freq: Every evening | ORAL | 3 refills | 90.00000 days
Start: 2020-11-28 — End: ?

## 2020-11-29 MED ORDER — HYDROCHLOROTHIAZIDE 25 MG TABLET
ORAL | 0 refills | 0.00000 days
Start: 2020-11-29 — End: ?

## 2020-11-30 DIAGNOSIS — M797 Fibromyalgia: Principal | ICD-10-CM

## 2020-11-30 MED ORDER — HYDROCHLOROTHIAZIDE 25 MG TABLET
ORAL_TABLET | Freq: Every day | ORAL | 3 refills | 90 days | Status: CP
Start: 2020-11-30 — End: ?

## 2020-11-30 MED ORDER — GABAPENTIN 100 MG CAPSULE
ORAL_CAPSULE | Freq: Three times a day (TID) | ORAL | 3 refills | 90 days | Status: CP | PRN
Start: 2020-11-30 — End: ?

## 2020-11-30 MED ORDER — GABAPENTIN 300 MG CAPSULE
ORAL_CAPSULE | Freq: Every evening | ORAL | 3 refills | 90.00000 days | Status: CP
Start: 2020-11-30 — End: ?

## 2020-12-14 ENCOUNTER — Ambulatory Visit: Admit: 2020-12-14 | Discharge: 2020-12-14 | Payer: MEDICARE

## 2020-12-14 DIAGNOSIS — H02889 Meibomian gland dysfunction of unspecified eye, unspecified eyelid: Principal | ICD-10-CM

## 2020-12-14 DIAGNOSIS — M797 Fibromyalgia: Principal | ICD-10-CM

## 2020-12-14 DIAGNOSIS — Z8579 Personal history of other malignant neoplasms of lymphoid, hematopoietic and related tissues: Principal | ICD-10-CM

## 2020-12-14 DIAGNOSIS — M3219 Other organ or system involvement in systemic lupus erythematosus: Principal | ICD-10-CM

## 2020-12-14 DIAGNOSIS — H16223 Keratoconjunctivitis sicca, not specified as Sjogren's, bilateral: Principal | ICD-10-CM

## 2020-12-14 MED ORDER — FLUOROMETHOLONE 0.1 % EYE DROPS,SUSPENSION
Freq: Every day | OPHTHALMIC | 3 refills | 0.00000 days | Status: CP
Start: 2020-12-14 — End: 2021-03-14

## 2021-01-01 MED FILL — EMGALITY PEN 120 MG/ML SUBCUTANEOUS PEN INJECTOR: SUBCUTANEOUS | 30 days supply | Qty: 1 | Fill #7

## 2021-01-04 ENCOUNTER — Institutional Professional Consult (permissible substitution): Admit: 2021-01-04 | Discharge: 2021-01-05 | Payer: MEDICARE

## 2021-01-08 DIAGNOSIS — G43119 Migraine with aura, intractable, without status migrainosus: Principal | ICD-10-CM

## 2021-01-17 ENCOUNTER — Ambulatory Visit: Admit: 2021-01-17 | Discharge: 2021-01-17 | Payer: MEDICARE

## 2021-02-20 MED ORDER — TRAMADOL 50 MG TABLET
ORAL_TABLET | Freq: Three times a day (TID) | ORAL | 5 refills | 14 days | PRN
Start: 2021-02-20 — End: ?

## 2021-02-21 MED ORDER — TRAMADOL 50 MG TABLET
ORAL_TABLET | Freq: Three times a day (TID) | ORAL | 1 refills | 17.00000 days | Status: CP | PRN
Start: 2021-02-21 — End: ?

## 2021-03-14 DIAGNOSIS — E039 Hypothyroidism, unspecified: Principal | ICD-10-CM

## 2021-03-14 MED ORDER — LEVOTHYROXINE 50 MCG TABLET
ORAL_TABLET | 3 refills | 0.00000 days | Status: CP
Start: 2021-03-14 — End: 2021-09-05

## 2021-04-16 MED ORDER — GABAPENTIN 300 MG CAPSULE
ORAL_CAPSULE | 3 refills | 0 days | Status: CP
Start: 2021-04-16 — End: ?

## 2021-04-16 MED ORDER — TRAMADOL 50 MG TABLET
ORAL_TABLET | Freq: Three times a day (TID) | ORAL | 1 refills | 17 days | Status: CP | PRN
Start: 2021-04-16 — End: ?

## 2021-04-16 MED ORDER — OMEPRAZOLE 40 MG CAPSULE,DELAYED RELEASE
ORAL_CAPSULE | Freq: Every day | ORAL | 3 refills | 90 days | Status: CP
Start: 2021-04-16 — End: ?

## 2021-04-18 DIAGNOSIS — J301 Allergic rhinitis due to pollen: Principal | ICD-10-CM

## 2021-04-18 MED ORDER — LEVOCETIRIZINE 5 MG TABLET
ORAL_TABLET | Freq: Every day | ORAL | 3 refills | 90 days | Status: CP
Start: 2021-04-18 — End: ?

## 2021-04-24 ENCOUNTER — Institutional Professional Consult (permissible substitution): Admit: 2021-04-24 | Discharge: 2021-04-24 | Payer: MEDICARE

## 2021-04-24 DIAGNOSIS — J019 Acute sinusitis, unspecified: Principal | ICD-10-CM

## 2021-04-24 MED ORDER — GUAIFENESIN ER 600 MG TABLET, EXTENDED RELEASE 12 HR
ORAL_TABLET | Freq: Two times a day (BID) | ORAL | 2 refills | 15.00000 days | Status: CP
Start: 2021-04-24 — End: 2022-04-24

## 2021-04-24 MED ORDER — LEVOFLOXACIN 750 MG TABLET
ORAL_TABLET | Freq: Every day | ORAL | 0 refills | 7 days | Status: CP
Start: 2021-04-24 — End: 2021-05-01

## 2021-05-25 DIAGNOSIS — R634 Abnormal weight loss: Principal | ICD-10-CM

## 2021-05-25 DIAGNOSIS — D472 Monoclonal gammopathy: Principal | ICD-10-CM

## 2021-06-04 ENCOUNTER — Ambulatory Visit: Admit: 2021-06-04 | Discharge: 2021-06-05 | Payer: MEDICARE

## 2021-06-04 DIAGNOSIS — H40003 Preglaucoma, unspecified, bilateral: Principal | ICD-10-CM

## 2021-06-04 DIAGNOSIS — H26492 Other secondary cataract, left eye: Principal | ICD-10-CM

## 2021-06-04 DIAGNOSIS — Z79899 Other long term (current) drug therapy: Principal | ICD-10-CM

## 2021-06-05 ENCOUNTER — Ambulatory Visit: Admit: 2021-06-05 | Discharge: 2021-06-06 | Payer: MEDICARE

## 2021-06-05 DIAGNOSIS — R634 Abnormal weight loss: Principal | ICD-10-CM

## 2021-06-05 DIAGNOSIS — M329 Systemic lupus erythematosus, unspecified: Principal | ICD-10-CM

## 2021-06-05 DIAGNOSIS — D472 Monoclonal gammopathy: Principal | ICD-10-CM

## 2021-06-11 DIAGNOSIS — D472 Monoclonal gammopathy: Principal | ICD-10-CM

## 2021-07-04 DIAGNOSIS — Z8579 Personal history of other malignant neoplasms of lymphoid, hematopoietic and related tissues: Principal | ICD-10-CM

## 2021-07-05 ENCOUNTER — Ambulatory Visit: Admit: 2021-07-05 | Discharge: 2021-07-06 | Payer: MEDICARE

## 2021-07-05 ENCOUNTER — Other Ambulatory Visit: Admit: 2021-07-05 | Discharge: 2021-07-06 | Payer: MEDICARE

## 2021-07-05 DIAGNOSIS — Z8579 Personal history of other malignant neoplasms of lymphoid, hematopoietic and related tissues: Principal | ICD-10-CM

## 2021-07-05 DIAGNOSIS — D472 Monoclonal gammopathy: Principal | ICD-10-CM

## 2021-07-30 DIAGNOSIS — M797 Fibromyalgia: Principal | ICD-10-CM

## 2021-07-30 DIAGNOSIS — F3341 Major depressive disorder, recurrent, in partial remission: Principal | ICD-10-CM

## 2021-07-30 DIAGNOSIS — G894 Chronic pain syndrome: Principal | ICD-10-CM

## 2021-07-30 MED ORDER — DULOXETINE 60 MG CAPSULE,DELAYED RELEASE
ORAL_CAPSULE | 0 refills | 0 days
Start: 2021-07-30 — End: ?

## 2021-07-31 MED ORDER — DULOXETINE 60 MG CAPSULE,DELAYED RELEASE
ORAL_CAPSULE | 0 refills | 0 days
Start: 2021-07-31 — End: ?

## 2021-08-03 DIAGNOSIS — M797 Fibromyalgia: Principal | ICD-10-CM

## 2021-08-03 DIAGNOSIS — M3219 Other organ or system involvement in systemic lupus erythematosus: Principal | ICD-10-CM

## 2021-08-03 MED ORDER — GABAPENTIN 100 MG CAPSULE
ORAL_CAPSULE | Freq: Three times a day (TID) | ORAL | 3 refills | 90 days | PRN
Start: 2021-08-03 — End: ?

## 2021-08-03 MED ORDER — TRAMADOL 50 MG TABLET
ORAL_TABLET | Freq: Three times a day (TID) | ORAL | 1 refills | 17 days | PRN
Start: 2021-08-03 — End: ?

## 2021-08-03 MED ORDER — HYDROCHLOROTHIAZIDE 25 MG TABLET
ORAL_TABLET | Freq: Every day | ORAL | 3 refills | 90 days
Start: 2021-08-03 — End: ?

## 2021-08-03 MED ORDER — NAPROXEN 500 MG TABLET
ORAL_TABLET | Freq: Two times a day (BID) | ORAL | 11 refills | 30 days
Start: 2021-08-03 — End: ?

## 2021-08-03 MED ORDER — GABAPENTIN 300 MG CAPSULE
ORAL_CAPSULE | 3 refills | 0 days
Start: 2021-08-03 — End: ?

## 2021-08-06 MED ORDER — TRAMADOL 50 MG TABLET
ORAL_TABLET | Freq: Three times a day (TID) | ORAL | 1 refills | 17 days | PRN
Start: 2021-08-06 — End: ?

## 2021-08-06 MED ORDER — GABAPENTIN 300 MG CAPSULE
ORAL_CAPSULE | ORAL | 3 refills | 0.00000 days | Status: CP
Start: 2021-08-06 — End: ?

## 2021-08-06 MED ORDER — NAPROXEN 500 MG TABLET
ORAL_TABLET | Freq: Two times a day (BID) | ORAL | 11 refills | 30 days
Start: 2021-08-06 — End: ?

## 2021-08-06 MED ORDER — HYDROCHLOROTHIAZIDE 25 MG TABLET
ORAL_TABLET | Freq: Every day | ORAL | 3 refills | 90 days
Start: 2021-08-06 — End: ?

## 2021-08-06 MED ORDER — GABAPENTIN 100 MG CAPSULE
ORAL_CAPSULE | Freq: Three times a day (TID) | ORAL | 3 refills | 90 days | PRN
Start: 2021-08-06 — End: ?

## 2021-08-07 ENCOUNTER — Telehealth: Admit: 2021-08-07 | Discharge: 2021-08-08 | Payer: MEDICARE

## 2021-08-07 DIAGNOSIS — Z1211 Encounter for screening for malignant neoplasm of colon: Principal | ICD-10-CM

## 2021-08-07 DIAGNOSIS — J301 Allergic rhinitis due to pollen: Principal | ICD-10-CM

## 2021-08-07 DIAGNOSIS — M797 Fibromyalgia: Principal | ICD-10-CM

## 2021-08-07 DIAGNOSIS — R634 Abnormal weight loss: Principal | ICD-10-CM

## 2021-08-07 DIAGNOSIS — Z833 Family history of diabetes mellitus: Principal | ICD-10-CM

## 2021-08-07 DIAGNOSIS — E039 Hypothyroidism, unspecified: Principal | ICD-10-CM

## 2021-08-07 DIAGNOSIS — E782 Mixed hyperlipidemia: Principal | ICD-10-CM

## 2021-08-07 MED ORDER — GABAPENTIN 100 MG CAPSULE
ORAL_CAPSULE | Freq: Three times a day (TID) | ORAL | 3 refills | 90 days | Status: CP | PRN
Start: 2021-08-07 — End: ?

## 2021-08-07 MED ORDER — LEVOCETIRIZINE 5 MG TABLET
ORAL_TABLET | Freq: Every day | ORAL | 3 refills | 90 days | Status: CP
Start: 2021-08-07 — End: ?

## 2021-08-07 MED ORDER — TRAMADOL 50 MG TABLET
ORAL_TABLET | Freq: Three times a day (TID) | ORAL | 5 refills | 14 days | Status: CP | PRN
Start: 2021-08-07 — End: ?

## 2021-08-07 MED ORDER — HYDROCHLOROTHIAZIDE 25 MG TABLET
ORAL_TABLET | Freq: Every day | ORAL | 3 refills | 90 days | Status: CP
Start: 2021-08-07 — End: ?

## 2021-08-09 ENCOUNTER — Ambulatory Visit: Admit: 2021-08-09 | Discharge: 2021-08-09 | Payer: MEDICARE

## 2021-08-09 DIAGNOSIS — Z833 Family history of diabetes mellitus: Principal | ICD-10-CM

## 2021-08-09 DIAGNOSIS — E782 Mixed hyperlipidemia: Principal | ICD-10-CM

## 2021-08-09 DIAGNOSIS — E039 Hypothyroidism, unspecified: Principal | ICD-10-CM

## 2021-08-09 MED ORDER — LOTEPREDNOL ETABONATE 0.5 % EYE DROPS,SUSPENSION
3 refills | 0 days
Start: 2021-08-09 — End: ?

## 2021-08-09 MED ORDER — LOTEPREDNOL ETABONATE 0.25 % EYE DROPS,SUSPENSION
Freq: Every day | OPHTHALMIC | 3 refills | 0 days | Status: CP
Start: 2021-08-09 — End: 2021-11-07

## 2021-08-10 MED ORDER — LOTEPREDNOL ETABONATE 0.5 % EYE DROPS,SUSPENSION
3 refills | 0 days
Start: 2021-08-10 — End: ?

## 2021-08-23 ENCOUNTER — Other Ambulatory Visit: Payer: Self-pay | Admitting: Internal Medicine

## 2021-08-23 DIAGNOSIS — I10 Essential (primary) hypertension: Secondary | ICD-10-CM

## 2021-10-16 ENCOUNTER — Ambulatory Visit: Admit: 2021-10-16 | Discharge: 2021-10-16 | Payer: MEDICARE

## 2021-10-16 DIAGNOSIS — Z79899 Other long term (current) drug therapy: Principal | ICD-10-CM

## 2021-10-16 DIAGNOSIS — R519 Nonintractable headache, unspecified chronicity pattern, unspecified headache type: Principal | ICD-10-CM

## 2021-10-16 DIAGNOSIS — Z114 Encounter for screening for human immunodeficiency virus [HIV]: Principal | ICD-10-CM

## 2021-10-16 DIAGNOSIS — F5105 Insomnia due to other mental disorder: Principal | ICD-10-CM

## 2021-10-16 DIAGNOSIS — D472 Monoclonal gammopathy: Principal | ICD-10-CM

## 2021-10-16 DIAGNOSIS — F3341 Major depressive disorder, recurrent, in partial remission: Principal | ICD-10-CM

## 2021-10-16 DIAGNOSIS — R634 Abnormal weight loss: Principal | ICD-10-CM

## 2021-10-16 DIAGNOSIS — M329 Systemic lupus erythematosus, unspecified: Principal | ICD-10-CM

## 2021-10-16 DIAGNOSIS — Z9149 Other personal history of psychological trauma, not elsewhere classified: Principal | ICD-10-CM

## 2021-10-16 DIAGNOSIS — R110 Nausea: Principal | ICD-10-CM

## 2021-10-16 DIAGNOSIS — G894 Chronic pain syndrome: Principal | ICD-10-CM

## 2021-10-16 DIAGNOSIS — M797 Fibromyalgia: Principal | ICD-10-CM

## 2021-10-16 MED ORDER — CLONAZEPAM 1 MG TABLET
ORAL_TABLET | 0 refills | 0 days | Status: CP
Start: 2021-10-16 — End: 2021-10-16

## 2021-10-16 MED ORDER — TRAZODONE 50 MG TABLET
ORAL_TABLET | Freq: Every evening | ORAL | 3 refills | 90 days | Status: CP
Start: 2021-10-16 — End: 2022-10-11

## 2021-10-16 MED ORDER — OMEPRAZOLE 40 MG CAPSULE,DELAYED RELEASE
ORAL_CAPSULE | Freq: Every day | ORAL | 3 refills | 90 days | Status: CP
Start: 2021-10-16 — End: ?

## 2021-10-16 MED ORDER — DULOXETINE 60 MG CAPSULE,DELAYED RELEASE
ORAL_CAPSULE | Freq: Every day | ORAL | 3 refills | 90 days | Status: CP
Start: 2021-10-16 — End: ?

## 2021-10-16 MED ORDER — MIRTAZAPINE 7.5 MG TABLET
ORAL_TABLET | 3 refills | 0 days | Status: CP
Start: 2021-10-16 — End: ?

## 2021-10-17 DIAGNOSIS — R519 Nonintractable headache, unspecified chronicity pattern, unspecified headache type: Principal | ICD-10-CM

## 2021-10-17 MED ORDER — TRAMADOL 50 MG TABLET
ORAL_TABLET | Freq: Three times a day (TID) | ORAL | 1 refills | 14 days | Status: CP | PRN
Start: 2021-10-17 — End: ?

## 2021-10-31 DIAGNOSIS — B9689 Other specified bacterial agents as the cause of diseases classified elsewhere: Principal | ICD-10-CM

## 2021-10-31 DIAGNOSIS — J019 Acute sinusitis, unspecified: Principal | ICD-10-CM

## 2021-10-31 MED ORDER — LEVOFLOXACIN 500 MG TABLET
ORAL_TABLET | Freq: Every day | ORAL | 0 refills | 7 days | Status: CP
Start: 2021-10-31 — End: 2021-11-07

## 2021-11-01 DIAGNOSIS — M3219 Other organ or system involvement in systemic lupus erythematosus: Principal | ICD-10-CM

## 2021-11-01 DIAGNOSIS — M797 Fibromyalgia: Principal | ICD-10-CM

## 2021-11-01 MED ORDER — NAPROXEN 500 MG TABLET
ORAL_TABLET | Freq: Two times a day (BID) | ORAL | 11 refills | 30.00000 days
Start: 2021-11-01 — End: ?

## 2021-11-02 MED ORDER — NAPROXEN 500 MG TABLET
ORAL_TABLET | Freq: Two times a day (BID) | ORAL | 11 refills | 30.00000 days
Start: 2021-11-02 — End: ?

## 2021-11-03 MED ORDER — NAPROXEN 500 MG TABLET
ORAL_TABLET | Freq: Two times a day (BID) | ORAL | 11 refills | 30 days
Start: 2021-11-03 — End: ?

## 2021-11-07 DIAGNOSIS — J0191 Acute recurrent sinusitis, unspecified: Principal | ICD-10-CM

## 2021-11-07 MED ORDER — LEVOFLOXACIN 500 MG TABLET
ORAL_TABLET | Freq: Every day | ORAL | 0 refills | 5 days | Status: CP
Start: 2021-11-07 — End: 2021-11-12

## 2021-11-18 DIAGNOSIS — F5105 Insomnia due to other mental disorder: Principal | ICD-10-CM

## 2021-11-18 DIAGNOSIS — Z9149 Other personal history of psychological trauma, not elsewhere classified: Principal | ICD-10-CM

## 2021-11-18 DIAGNOSIS — Z79899 Other long term (current) drug therapy: Principal | ICD-10-CM

## 2021-11-18 MED ORDER — CLONAZEPAM 1 MG TABLET
ORAL_TABLET | 0 refills | 0 days
Start: 2021-11-18 — End: ?

## 2021-11-19 DIAGNOSIS — C8590 Non-Hodgkin lymphoma, unspecified, unspecified site: Principal | ICD-10-CM

## 2021-11-20 DIAGNOSIS — M797 Fibromyalgia: Principal | ICD-10-CM

## 2021-11-20 DIAGNOSIS — M3219 Other organ or system involvement in systemic lupus erythematosus: Principal | ICD-10-CM

## 2021-11-20 MED ORDER — CLONAZEPAM 1 MG TABLET
ORAL_TABLET | 0 refills | 0 days | Status: CP
Start: 2021-11-20 — End: ?

## 2021-11-20 MED ORDER — NAPROXEN 500 MG TABLET
ORAL_TABLET | Freq: Every evening | ORAL | 3 refills | 46 days | Status: CP | PRN
Start: 2021-11-20 — End: ?

## 2021-11-22 ENCOUNTER — Other Ambulatory Visit: Admit: 2021-11-22 | Discharge: 2021-11-22 | Payer: MEDICARE

## 2021-11-22 ENCOUNTER — Ambulatory Visit: Admit: 2021-11-22 | Discharge: 2021-11-22 | Payer: MEDICARE

## 2021-11-22 DIAGNOSIS — D472 Monoclonal gammopathy: Principal | ICD-10-CM

## 2021-11-22 DIAGNOSIS — C8590 Non-Hodgkin lymphoma, unspecified, unspecified site: Principal | ICD-10-CM

## 2021-11-22 DIAGNOSIS — R634 Abnormal weight loss: Principal | ICD-10-CM

## 2021-11-22 MED ORDER — MIRTAZAPINE 7.5 MG TABLET
ORAL_TABLET | 2 refills | 0 days
Start: 2021-11-22 — End: ?

## 2021-11-23 ENCOUNTER — Encounter: Admit: 2021-11-23 | Discharge: 2021-11-23 | Payer: MEDICARE | Attending: Anesthesiology | Primary: Anesthesiology

## 2021-11-23 ENCOUNTER — Ambulatory Visit: Admit: 2021-11-23 | Discharge: 2021-11-23 | Payer: MEDICARE

## 2021-11-23 MED ORDER — MIRTAZAPINE 7.5 MG TABLET
ORAL_TABLET | 2 refills | 0 days
Start: 2021-11-23 — End: ?

## 2021-11-26 MED ORDER — GABAPENTIN 300 MG CAPSULE
ORAL_CAPSULE | 3 refills | 0 days
Start: 2021-11-26 — End: ?

## 2021-11-27 DIAGNOSIS — C8590 Non-Hodgkin lymphoma, unspecified, unspecified site: Principal | ICD-10-CM

## 2021-11-27 MED ORDER — GABAPENTIN 300 MG CAPSULE
ORAL_CAPSULE | 3 refills | 0 days | Status: CP
Start: 2021-11-27 — End: ?

## 2021-12-01 IMAGING — CR DG CHEST 2V
1 series · 2 of 2 positions shown · non-contrast
Comparison: None.

CLINICAL DATA: Shortness of breath

EXAM:
CHEST - 2 VIEW

[Series 1: dg chest 2 view · 0.14mm/px · 2 of 2 slices shown]
[im 1/2]
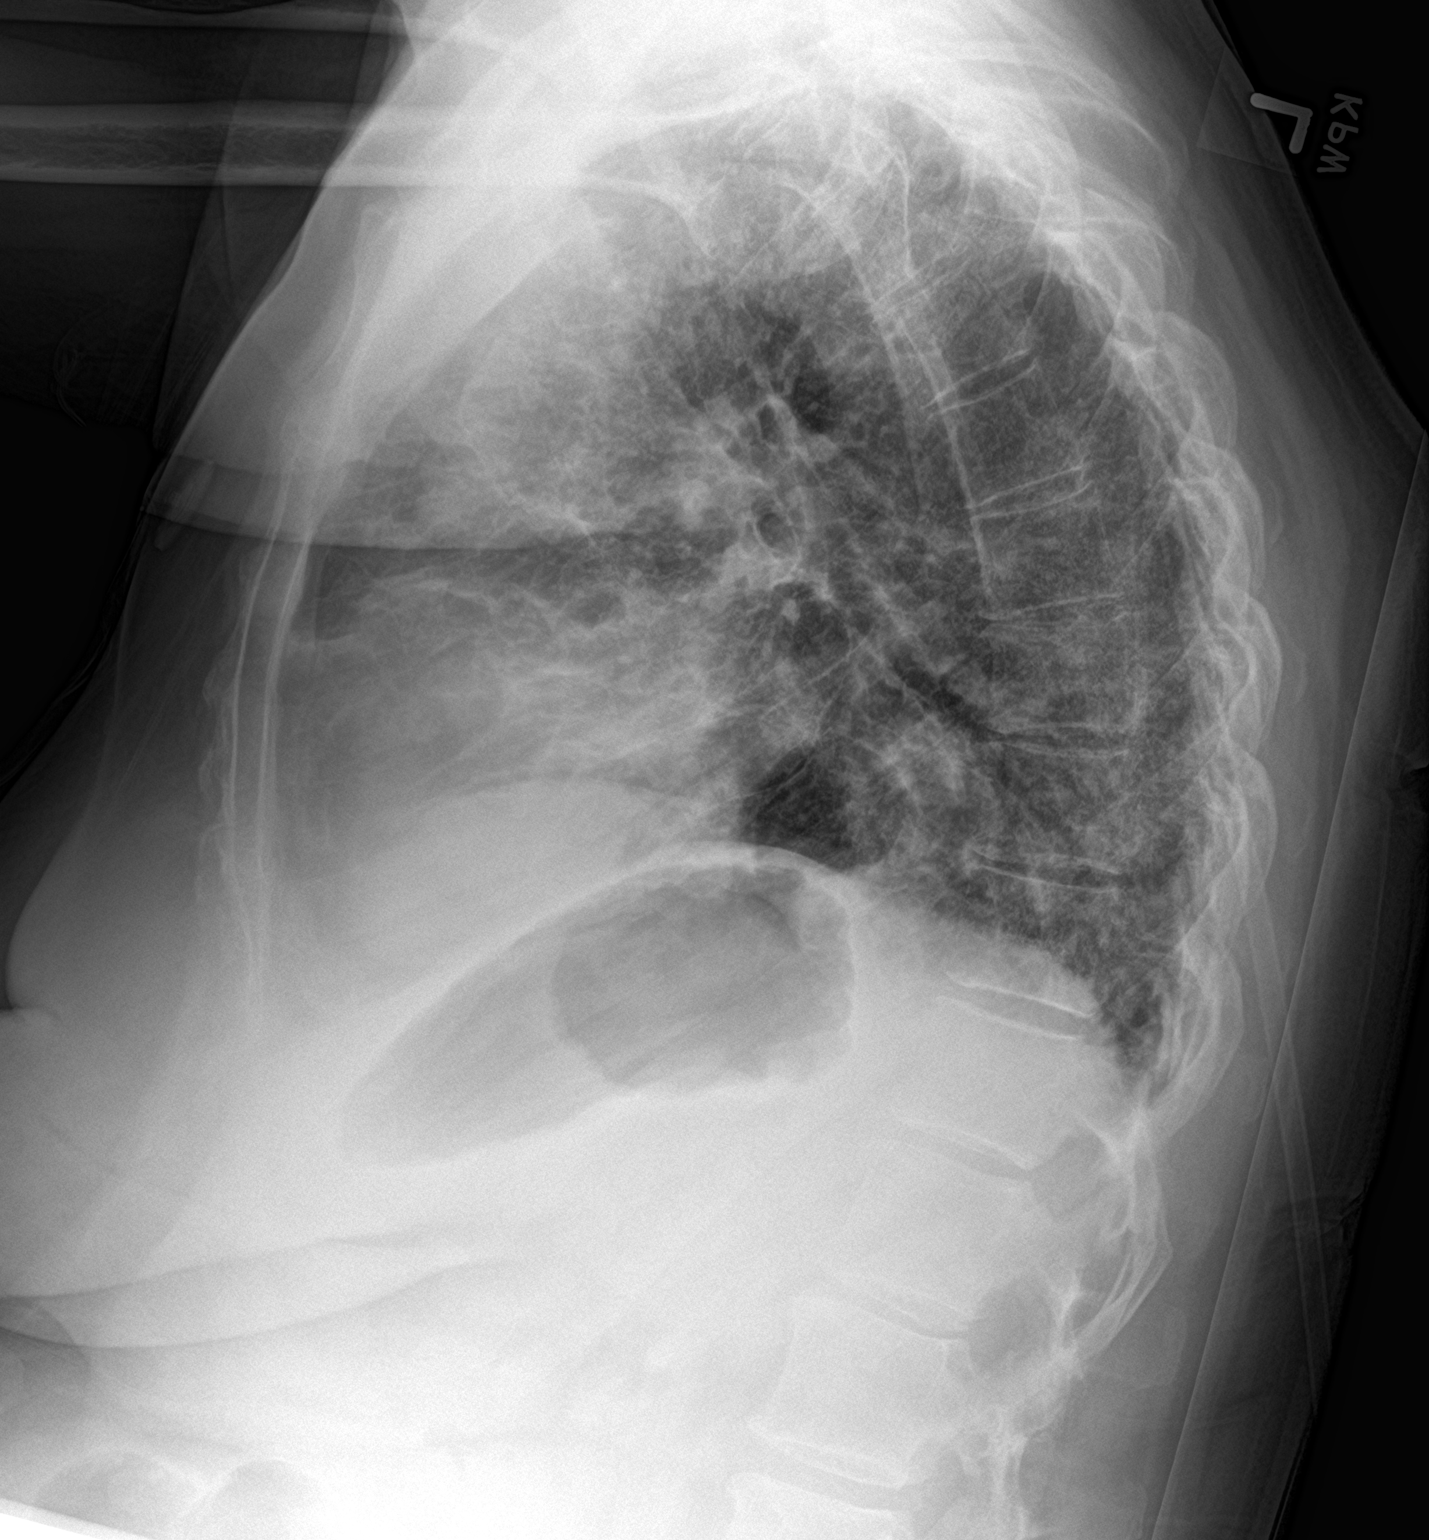
[im 2/2]
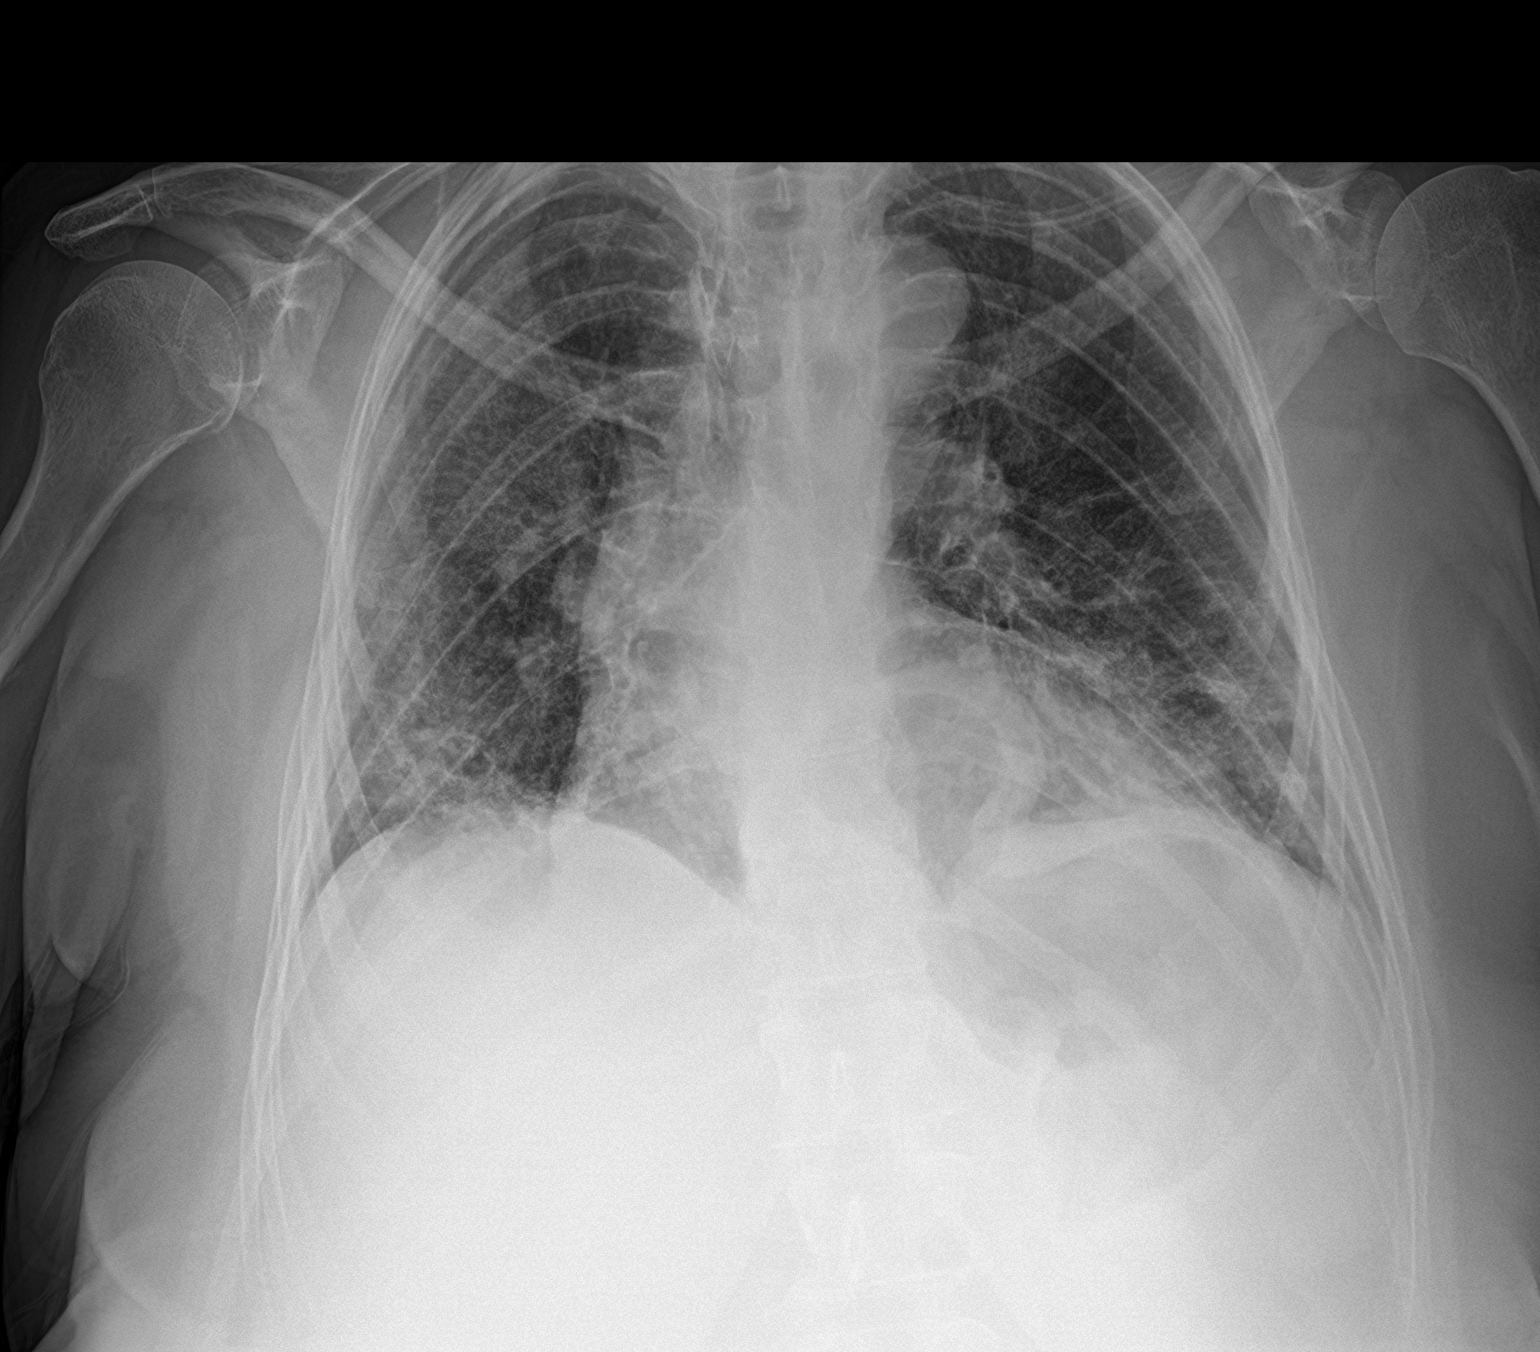

[2 of 2 positions shown; findings below may reference images not displayed]

FINDINGS: Patchy bilateral mid to lower lung airspace opacities. No pleural
effusion. Mild cardiomegaly. No pneumothorax. Moderate hiatal hernia
IMPRESSION: Patchy bilateral mid to lower lung airspace opacities suspicious for
multifocal pneumonia, possible atypical or viral pneumonia. Hiatal
hernia.

## 2021-12-17 DIAGNOSIS — F5105 Insomnia due to other mental disorder: Principal | ICD-10-CM

## 2021-12-17 DIAGNOSIS — Z9149 Other personal history of psychological trauma, not elsewhere classified: Principal | ICD-10-CM

## 2021-12-17 DIAGNOSIS — Z79899 Other long term (current) drug therapy: Principal | ICD-10-CM

## 2021-12-17 MED ORDER — CLONAZEPAM 1 MG TABLET
ORAL_TABLET | 0 refills | 0.00000 days | Status: CN
Start: 2021-12-17 — End: ?

## 2021-12-18 DIAGNOSIS — Z9149 Other personal history of psychological trauma, not elsewhere classified: Principal | ICD-10-CM

## 2021-12-18 DIAGNOSIS — F5105 Insomnia due to other mental disorder: Principal | ICD-10-CM

## 2021-12-18 DIAGNOSIS — Z79899 Other long term (current) drug therapy: Principal | ICD-10-CM

## 2021-12-18 MED ORDER — CLONAZEPAM 1 MG TABLET
ORAL_TABLET | 2 refills | 0 days | Status: CP
Start: 2021-12-18 — End: ?

## 2021-12-31 DIAGNOSIS — U071 COVID: Principal | ICD-10-CM

## 2021-12-31 MED ORDER — NIRMATRELVIR 300 MG (150 MG X2)-RITONAVIR 100 MG TABLET,DOSE PACK
ORAL_TABLET | 0 refills | 0 days | Status: CP
Start: 2021-12-31 — End: ?

## 2022-01-01 ENCOUNTER — Telehealth: Admit: 2022-01-01 | Discharge: 2022-01-02 | Payer: MEDICARE

## 2022-01-01 DIAGNOSIS — F3341 Major depressive disorder, recurrent, in partial remission: Principal | ICD-10-CM

## 2022-01-01 DIAGNOSIS — Z9149 Other personal history of psychological trauma, not elsewhere classified: Principal | ICD-10-CM

## 2022-01-01 DIAGNOSIS — M797 Fibromyalgia: Principal | ICD-10-CM

## 2022-01-01 DIAGNOSIS — G894 Chronic pain syndrome: Principal | ICD-10-CM

## 2022-01-01 DIAGNOSIS — U071 COVID: Principal | ICD-10-CM

## 2022-01-01 MED ORDER — TRAZODONE 50 MG TABLET
ORAL_TABLET | Freq: Every evening | ORAL | 3 refills | 90 days | Status: CP
Start: 2022-01-01 — End: 2022-12-27

## 2022-01-10 DIAGNOSIS — M797 Fibromyalgia: Principal | ICD-10-CM

## 2022-01-10 MED ORDER — TRAMADOL 50 MG TABLET
ORAL_TABLET | Freq: Three times a day (TID) | ORAL | 1 refills | 14 days | Status: CP | PRN
Start: 2022-01-10 — End: ?

## 2022-02-12 DIAGNOSIS — M3219 Other organ or system involvement in systemic lupus erythematosus: Principal | ICD-10-CM

## 2022-02-12 DIAGNOSIS — M797 Fibromyalgia: Principal | ICD-10-CM

## 2022-02-12 MED ORDER — NAPROXEN 500 MG TABLET
ORAL_TABLET | Freq: Every evening | ORAL | 3 refills | 46 days | PRN
Start: 2022-02-12 — End: ?

## 2022-02-13 MED ORDER — NAPROXEN 500 MG TABLET
ORAL_TABLET | Freq: Every evening | ORAL | 3 refills | 46 days | Status: CP | PRN
Start: 2022-02-13 — End: ?

## 2022-03-07 DIAGNOSIS — M797 Fibromyalgia: Principal | ICD-10-CM

## 2022-03-07 MED ORDER — TRAMADOL 50 MG TABLET
ORAL_TABLET | Freq: Three times a day (TID) | ORAL | 1 refills | 14 days | Status: CP | PRN
Start: 2022-03-07 — End: ?

## 2022-03-15 DIAGNOSIS — E039 Hypothyroidism, unspecified: Principal | ICD-10-CM

## 2022-03-15 DIAGNOSIS — M797 Fibromyalgia: Principal | ICD-10-CM

## 2022-03-15 MED ORDER — GABAPENTIN 300 MG CAPSULE
ORAL_CAPSULE | 1 refills | 0 days | Status: CP
Start: 2022-03-15 — End: 2022-03-15

## 2022-03-15 MED ORDER — LEVOTHYROXINE 50 MCG TABLET
ORAL_TABLET | 3 refills | 0 days | Status: CP
Start: 2022-03-15 — End: 2022-09-06

## 2022-03-15 MED ORDER — GABAPENTIN 100 MG CAPSULE
ORAL_CAPSULE | Freq: Three times a day (TID) | ORAL | 3 refills | 90 days | Status: CP | PRN
Start: 2022-03-15 — End: ?

## 2022-03-19 ENCOUNTER — Ambulatory Visit
Admit: 2022-03-19 | Discharge: 2022-03-20 | Payer: MEDICARE | Attending: Student in an Organized Health Care Education/Training Program | Primary: Student in an Organized Health Care Education/Training Program

## 2022-03-19 DIAGNOSIS — G43109 Migraine with aura, not intractable, without status migrainosus: Principal | ICD-10-CM

## 2022-03-19 MED ORDER — AIMOVIG AUTOINJECTOR 70 MG/ML SUBCUTANEOUS AUTO-INJECTOR
0 refills | 0 days
Start: 2022-03-19 — End: ?

## 2022-03-19 MED ORDER — FREMANEZUMAB-VFRM 225 MG/1.5 ML SUBCUTANEOUS AUTO-INJECTOR
SUBCUTANEOUS | 5 refills | 0 days | Status: CP
Start: 2022-03-19 — End: ?

## 2022-03-20 MED ORDER — ERENUMAB-AOOE 70 MG/ML SUBCUTANEOUS AUTO-INJECTOR
SUBCUTANEOUS | 5 refills | 0 days | Status: CP
Start: 2022-03-20 — End: ?

## 2022-03-20 MED ORDER — AIMOVIG AUTOINJECTOR 70 MG/ML SUBCUTANEOUS AUTO-INJECTOR
0 refills | 0 days
Start: 2022-03-20 — End: ?

## 2022-04-06 ENCOUNTER — Ambulatory Visit: Admit: 2022-04-06 | Discharge: 2022-04-07 | Payer: MEDICARE

## 2022-04-24 ENCOUNTER — Ambulatory Visit: Admit: 2022-04-24 | Discharge: 2022-04-25 | Payer: MEDICARE

## 2022-04-24 DIAGNOSIS — H16223 Keratoconjunctivitis sicca, not specified as Sjogren's, bilateral: Principal | ICD-10-CM

## 2022-04-24 DIAGNOSIS — H04123 Dry eye syndrome of bilateral lacrimal glands: Principal | ICD-10-CM

## 2022-04-24 MED ORDER — VARENICLINE 0.03 MG/SPRAY NASAL SPRAY
Freq: Three times a day (TID) | NASAL | 3 refills | 0 days | Status: CP
Start: 2022-04-24 — End: 2022-07-23

## 2022-04-24 MED ORDER — CYCLOSPORINE 0.05 % EYE DROPS IN A DROPPERETTE
0 refills | 0 days
Start: 2022-04-24 — End: ?

## 2022-05-06 DIAGNOSIS — M797 Fibromyalgia: Principal | ICD-10-CM

## 2022-05-06 DIAGNOSIS — F5105 Insomnia due to other mental disorder: Principal | ICD-10-CM

## 2022-05-06 DIAGNOSIS — Z9149 Other personal history of psychological trauma, not elsewhere classified: Principal | ICD-10-CM

## 2022-05-06 DIAGNOSIS — Z79899 Other long term (current) drug therapy: Principal | ICD-10-CM

## 2022-05-06 MED ORDER — CLONAZEPAM 1 MG TABLET
ORAL_TABLET | 2 refills | 0 days | Status: CP
Start: 2022-05-06 — End: ?

## 2022-05-06 MED ORDER — TRAMADOL 50 MG TABLET
ORAL_TABLET | Freq: Three times a day (TID) | ORAL | 1 refills | 14.00000 days | Status: CP | PRN
Start: 2022-05-06 — End: ?

## 2022-05-08 DIAGNOSIS — J0191 Acute recurrent sinusitis, unspecified: Principal | ICD-10-CM

## 2022-05-08 MED ORDER — LEVOFLOXACIN 500 MG TABLET
ORAL_TABLET | Freq: Every day | ORAL | 0 refills | 10 days | Status: CP
Start: 2022-05-08 — End: ?

## 2022-05-21 ENCOUNTER — Telehealth: Admit: 2022-05-21 | Discharge: 2022-05-22 | Payer: MEDICARE

## 2022-05-21 DIAGNOSIS — R5383 Other fatigue: Principal | ICD-10-CM

## 2022-05-21 DIAGNOSIS — E039 Hypothyroidism, unspecified: Principal | ICD-10-CM

## 2022-05-21 DIAGNOSIS — E559 Vitamin D deficiency, unspecified: Principal | ICD-10-CM

## 2022-05-21 DIAGNOSIS — E538 Deficiency of other specified B group vitamins: Principal | ICD-10-CM

## 2022-05-21 DIAGNOSIS — K219 Gastro-esophageal reflux disease without esophagitis: Principal | ICD-10-CM

## 2022-05-21 MED ORDER — OMEPRAZOLE 40 MG CAPSULE,DELAYED RELEASE
ORAL_CAPSULE | Freq: Every day | ORAL | 3 refills | 90 days | Status: CP
Start: 2022-05-21 — End: ?

## 2022-05-23 DIAGNOSIS — G43109 Migraine with aura, not intractable, without status migrainosus: Principal | ICD-10-CM

## 2022-05-23 MED ORDER — FREMANEZUMAB-VFRM 225 MG/1.5 ML SUBCUTANEOUS AUTO-INJECTOR
SUBCUTANEOUS | 4 refills | 0 days | Status: CP
Start: 2022-05-23 — End: ?

## 2022-05-27 DIAGNOSIS — M329 Systemic lupus erythematosus, unspecified: Principal | ICD-10-CM

## 2022-05-30 ENCOUNTER — Other Ambulatory Visit: Admit: 2022-05-30 | Discharge: 2022-05-31 | Payer: MEDICARE

## 2022-05-30 ENCOUNTER — Ambulatory Visit: Admit: 2022-05-30 | Discharge: 2022-05-31 | Payer: MEDICARE

## 2022-05-30 DIAGNOSIS — C8590 Non-Hodgkin lymphoma, unspecified, unspecified site: Principal | ICD-10-CM

## 2022-05-30 DIAGNOSIS — E538 Deficiency of other specified B group vitamins: Principal | ICD-10-CM

## 2022-05-30 DIAGNOSIS — M329 Systemic lupus erythematosus, unspecified: Principal | ICD-10-CM

## 2022-05-30 DIAGNOSIS — G43109 Migraine with aura, not intractable, without status migrainosus: Principal | ICD-10-CM

## 2022-05-30 DIAGNOSIS — M797 Fibromyalgia: Principal | ICD-10-CM

## 2022-05-30 DIAGNOSIS — R5383 Other fatigue: Principal | ICD-10-CM

## 2022-05-30 DIAGNOSIS — E559 Vitamin D deficiency, unspecified: Principal | ICD-10-CM

## 2022-05-30 DIAGNOSIS — D472 Monoclonal gammopathy: Principal | ICD-10-CM

## 2022-05-30 DIAGNOSIS — E039 Hypothyroidism, unspecified: Principal | ICD-10-CM

## 2022-05-30 MED ORDER — ERENUMAB-AOOE 70 MG/ML SUBCUTANEOUS AUTO-INJECTOR
SUBCUTANEOUS | 5 refills | 0 days | Status: CP
Start: 2022-05-30 — End: ?
  Filled 2022-06-03: qty 1, 28d supply, fill #0

## 2022-05-31 DIAGNOSIS — E876 Hypokalemia: Principal | ICD-10-CM

## 2022-06-03 ENCOUNTER — Institutional Professional Consult (permissible substitution)
Admit: 2022-06-03 | Discharge: 2022-06-04 | Payer: MEDICARE | Attending: Student in an Organized Health Care Education/Training Program | Primary: Student in an Organized Health Care Education/Training Program

## 2022-06-03 DIAGNOSIS — G43119 Migraine with aura, intractable, without status migrainosus: Principal | ICD-10-CM

## 2022-06-03 DIAGNOSIS — G43109 Migraine with aura, not intractable, without status migrainosus: Principal | ICD-10-CM

## 2022-06-05 DIAGNOSIS — G43109 Migraine with aura, not intractable, without status migrainosus: Principal | ICD-10-CM

## 2022-06-05 MED ORDER — METHYLPREDNISOLONE 4 MG TABLETS IN A DOSE PACK
0 refills | 0 days | Status: CP
Start: 2022-06-05 — End: ?

## 2022-06-13 MED ORDER — VARENICLINE 0.03 MG/SPRAY NASAL SPRAY
Freq: Three times a day (TID) | NASAL | 3 refills | 0 days | Status: CP
Start: 2022-06-13 — End: ?

## 2022-06-14 DIAGNOSIS — J0191 Acute recurrent sinusitis, unspecified: Principal | ICD-10-CM

## 2022-06-14 MED ORDER — LEVOFLOXACIN 500 MG TABLET
ORAL_TABLET | Freq: Every day | ORAL | 0 refills | 10 days
Start: 2022-06-14 — End: ?

## 2022-06-17 DIAGNOSIS — J3089 Other allergic rhinitis: Principal | ICD-10-CM

## 2022-06-17 MED ORDER — AZELASTINE 137 MCG (0.1 %) NASAL SPRAY AEROSOL
Freq: Two times a day (BID) | NASAL | 11 refills | 0 days | Status: CP
Start: 2022-06-17 — End: ?

## 2022-07-04 DIAGNOSIS — G43109 Migraine with aura, not intractable, without status migrainosus: Principal | ICD-10-CM

## 2022-07-04 MED ORDER — ERENUMAB-AOOE 70 MG/ML SUBCUTANEOUS AUTO-INJECTOR
SUBCUTANEOUS | 5 refills | 0 days | Status: CP
Start: 2022-07-04 — End: ?

## 2022-07-06 DIAGNOSIS — M797 Fibromyalgia: Principal | ICD-10-CM

## 2022-07-06 MED ORDER — TRAMADOL 50 MG TABLET
ORAL_TABLET | Freq: Three times a day (TID) | ORAL | 1 refills | 14 days | PRN
Start: 2022-07-06 — End: ?

## 2022-07-06 MED ORDER — GABAPENTIN 300 MG CAPSULE
ORAL_CAPSULE | 1 refills | 0 days
Start: 2022-07-06 — End: ?

## 2022-07-08 DIAGNOSIS — G43109 Migraine with aura, not intractable, without status migrainosus: Principal | ICD-10-CM

## 2022-07-08 MED ORDER — ERENUMAB-AOOE 140 MG/ML SUBCUTANEOUS AUTO-INJECTOR
SUBCUTANEOUS | 5 refills | 0 days | Status: CP
Start: 2022-07-08 — End: 2022-07-08

## 2022-07-08 MED ORDER — TRAMADOL 50 MG TABLET
ORAL_TABLET | Freq: Three times a day (TID) | ORAL | 1 refills | 14 days | Status: CP | PRN
Start: 2022-07-08 — End: ?

## 2022-07-08 MED ORDER — ERENUMAB-AOOE 70 MG/ML SUBCUTANEOUS AUTO-INJECTOR
SUBCUTANEOUS | 5 refills | 28 days | Status: CP
Start: 2022-07-08 — End: ?

## 2022-07-08 MED ORDER — GABAPENTIN 300 MG CAPSULE
ORAL_CAPSULE | 1 refills | 0 days | Status: CP
Start: 2022-07-08 — End: ?

## 2022-07-08 MED ORDER — AIMOVIG AUTOINJECTOR 70 MG/ML SUBCUTANEOUS AUTO-INJECTOR
SUBCUTANEOUS | 5 refills | 28 days
Start: 2022-07-08 — End: ?

## 2022-07-09 ENCOUNTER — Ambulatory Visit: Admit: 2022-07-09 | Discharge: 2022-07-09 | Payer: MEDICARE

## 2022-07-09 DIAGNOSIS — G894 Chronic pain syndrome: Principal | ICD-10-CM

## 2022-07-09 DIAGNOSIS — J329 Chronic sinusitis, unspecified: Principal | ICD-10-CM

## 2022-07-09 DIAGNOSIS — Z9149 Other personal history of psychological trauma, not elsewhere classified: Principal | ICD-10-CM

## 2022-07-09 DIAGNOSIS — E876 Hypokalemia: Principal | ICD-10-CM

## 2022-07-09 DIAGNOSIS — R42 Dizziness and giddiness: Principal | ICD-10-CM

## 2022-07-09 DIAGNOSIS — D472 Monoclonal gammopathy: Principal | ICD-10-CM

## 2022-07-09 DIAGNOSIS — M797 Fibromyalgia: Principal | ICD-10-CM

## 2022-07-09 DIAGNOSIS — F3342 Major depressive disorder, recurrent, in full remission: Principal | ICD-10-CM

## 2022-07-09 DIAGNOSIS — F3341 Major depressive disorder, recurrent, in partial remission: Principal | ICD-10-CM

## 2022-07-09 DIAGNOSIS — H6122 Impacted cerumen, left ear: Principal | ICD-10-CM

## 2022-07-26 DIAGNOSIS — F5105 Insomnia due to other mental disorder: Principal | ICD-10-CM

## 2022-07-26 DIAGNOSIS — Z9149 Other personal history of psychological trauma, not elsewhere classified: Principal | ICD-10-CM

## 2022-07-26 DIAGNOSIS — Z79899 Other long term (current) drug therapy: Principal | ICD-10-CM

## 2022-07-26 MED ORDER — CLONAZEPAM 1 MG TABLET
ORAL_TABLET | 2 refills | 0 days
Start: 2022-07-26 — End: ?

## 2022-07-29 MED ORDER — CLONAZEPAM 1 MG TABLET
ORAL_TABLET | 2 refills | 0 days
Start: 2022-07-29 — End: ?

## 2022-08-14 DIAGNOSIS — G43109 Migraine with aura, not intractable, without status migrainosus: Principal | ICD-10-CM

## 2022-08-14 MED ORDER — ERENUMAB-AOOE 140 MG/ML SUBCUTANEOUS AUTO-INJECTOR
SUBCUTANEOUS | 5 refills | 0 days | Status: CP
Start: 2022-08-14 — End: ?

## 2022-08-16 DIAGNOSIS — J301 Allergic rhinitis due to pollen: Principal | ICD-10-CM

## 2022-08-16 MED ORDER — LEVOCETIRIZINE 5 MG TABLET
ORAL_TABLET | Freq: Every day | ORAL | 3 refills | 90 days
Start: 2022-08-16 — End: ?

## 2022-08-20 DIAGNOSIS — M797 Fibromyalgia: Principal | ICD-10-CM

## 2022-08-20 DIAGNOSIS — J301 Allergic rhinitis due to pollen: Principal | ICD-10-CM

## 2022-08-20 MED ORDER — LEVOCETIRIZINE 5 MG TABLET
ORAL_TABLET | Freq: Every day | ORAL | 3 refills | 90 days
Start: 2022-08-20 — End: ?

## 2022-08-20 MED ORDER — GABAPENTIN 300 MG CAPSULE
ORAL_CAPSULE | 1 refills | 0 days
Start: 2022-08-20 — End: ?

## 2022-08-20 MED ORDER — TRAMADOL 50 MG TABLET
ORAL_TABLET | Freq: Three times a day (TID) | ORAL | 1 refills | 14 days | PRN
Start: 2022-08-20 — End: ?

## 2022-08-21 MED ORDER — LEVOCETIRIZINE 5 MG TABLET
ORAL_TABLET | Freq: Every day | ORAL | 3 refills | 90 days | Status: CP
Start: 2022-08-21 — End: ?

## 2022-08-22 MED ORDER — GABAPENTIN 300 MG CAPSULE
ORAL_CAPSULE | 1 refills | 0 days | Status: CP
Start: 2022-08-22 — End: ?

## 2022-08-22 MED ORDER — TRAMADOL 50 MG TABLET
ORAL_TABLET | Freq: Three times a day (TID) | ORAL | 1 refills | 14 days | Status: CP | PRN
Start: 2022-08-22 — End: ?

## 2022-09-02 MED ORDER — NAPROXEN 500 MG TABLET
ORAL_TABLET | Freq: Every evening | ORAL | 3 refills | 46 days | Status: CP | PRN
Start: 2022-09-02 — End: ?

## 2022-10-03 DIAGNOSIS — I10 Essential (primary) hypertension: Principal | ICD-10-CM

## 2022-10-03 MED ORDER — HYDROCHLOROTHIAZIDE 25 MG TABLET
ORAL_TABLET | Freq: Every day | ORAL | 3 refills | 90 days | Status: CN
Start: 2022-10-03 — End: 2023-10-03

## 2022-10-03 MED ORDER — HYDROCHLOROTHIAZIDE 12.5 MG TABLET
ORAL_TABLET | Freq: Every day | ORAL | 3 refills | 90 days | Status: CP
Start: 2022-10-03 — End: 2023-10-03

## 2022-10-18 DIAGNOSIS — M797 Fibromyalgia: Principal | ICD-10-CM

## 2022-10-18 DIAGNOSIS — E039 Hypothyroidism, unspecified: Principal | ICD-10-CM

## 2022-10-18 DIAGNOSIS — F5105 Insomnia due to other mental disorder: Principal | ICD-10-CM

## 2022-10-18 DIAGNOSIS — Z79899 Other long term (current) drug therapy: Principal | ICD-10-CM

## 2022-10-18 DIAGNOSIS — Z9149 Other personal history of psychological trauma, not elsewhere classified: Principal | ICD-10-CM

## 2022-10-18 DIAGNOSIS — G894 Chronic pain syndrome: Principal | ICD-10-CM

## 2022-10-18 DIAGNOSIS — J301 Allergic rhinitis due to pollen: Principal | ICD-10-CM

## 2022-10-18 DIAGNOSIS — F3341 Major depressive disorder, recurrent, in partial remission: Principal | ICD-10-CM

## 2022-10-18 DIAGNOSIS — K219 Gastro-esophageal reflux disease without esophagitis: Principal | ICD-10-CM

## 2022-10-18 MED ORDER — LEVOCETIRIZINE 5 MG TABLET
ORAL_TABLET | Freq: Every day | ORAL | 3 refills | 90 days
Start: 2022-10-18 — End: ?

## 2022-10-18 MED ORDER — TRAZODONE 50 MG TABLET
ORAL_TABLET | Freq: Every evening | ORAL | 3 refills | 90 days
Start: 2022-10-18 — End: 2023-10-13

## 2022-10-18 MED ORDER — DULOXETINE 60 MG CAPSULE,DELAYED RELEASE
ORAL_CAPSULE | Freq: Every day | ORAL | 3 refills | 90 days
Start: 2022-10-18 — End: ?

## 2022-10-18 MED ORDER — OMEPRAZOLE 40 MG CAPSULE,DELAYED RELEASE
ORAL_CAPSULE | Freq: Every day | ORAL | 3 refills | 90 days
Start: 2022-10-18 — End: ?

## 2022-10-18 MED ORDER — TRAMADOL 50 MG TABLET
ORAL_TABLET | Freq: Three times a day (TID) | ORAL | 1 refills | 14 days | PRN
Start: 2022-10-18 — End: ?

## 2022-10-18 MED ORDER — CLONAZEPAM 1 MG TABLET
ORAL_TABLET | 2 refills | 0 days
Start: 2022-10-18 — End: ?

## 2022-10-18 MED ORDER — GABAPENTIN 100 MG CAPSULE
ORAL_CAPSULE | Freq: Three times a day (TID) | ORAL | 3 refills | 90 days | PRN
Start: 2022-10-18 — End: ?

## 2022-10-18 MED ORDER — GABAPENTIN 300 MG CAPSULE
ORAL_CAPSULE | 1 refills | 0 days
Start: 2022-10-18 — End: ?

## 2022-10-18 MED ORDER — LEVOTHYROXINE 50 MCG TABLET
ORAL_TABLET | 3 refills | 0 days
Start: 2022-10-18 — End: 2023-04-11

## 2022-10-21 DIAGNOSIS — F5105 Insomnia due to other mental disorder: Principal | ICD-10-CM

## 2022-10-21 DIAGNOSIS — Z79899 Other long term (current) drug therapy: Principal | ICD-10-CM

## 2022-10-21 DIAGNOSIS — Z9149 Other personal history of psychological trauma, not elsewhere classified: Principal | ICD-10-CM

## 2022-10-21 MED ORDER — TRAZODONE 50 MG TABLET
ORAL_TABLET | Freq: Every evening | ORAL | 3 refills | 90 days | Status: CP
Start: 2022-10-21 — End: 2023-10-16

## 2022-10-21 MED ORDER — TRAMADOL 50 MG TABLET
ORAL_TABLET | Freq: Three times a day (TID) | ORAL | 1 refills | 14 days | Status: CP | PRN
Start: 2022-10-21 — End: ?

## 2022-10-21 MED ORDER — GABAPENTIN 100 MG CAPSULE
ORAL_CAPSULE | Freq: Three times a day (TID) | ORAL | 3 refills | 90 days | Status: CP | PRN
Start: 2022-10-21 — End: ?

## 2022-10-21 MED ORDER — LEVOTHYROXINE 50 MCG TABLET
ORAL_TABLET | 2 refills | 0 days | Status: CP
Start: 2022-10-21 — End: 2023-04-11

## 2022-10-21 MED ORDER — GABAPENTIN 300 MG CAPSULE
ORAL_CAPSULE | 1 refills | 0 days | Status: CP
Start: 2022-10-21 — End: ?

## 2022-10-21 MED ORDER — OMEPRAZOLE 40 MG CAPSULE,DELAYED RELEASE
ORAL_CAPSULE | Freq: Every day | ORAL | 2 refills | 90 days | Status: CP
Start: 2022-10-21 — End: ?

## 2022-10-21 MED ORDER — CLONAZEPAM 1 MG TABLET
ORAL_TABLET | ORAL | 2 refills | 0.00000 days | Status: CN
Start: 2022-10-21 — End: ?

## 2022-10-21 MED ORDER — LEVOCETIRIZINE 5 MG TABLET
ORAL_TABLET | Freq: Every day | ORAL | 2 refills | 90 days | Status: CP
Start: 2022-10-21 — End: ?

## 2022-10-21 MED ORDER — DULOXETINE 60 MG CAPSULE,DELAYED RELEASE
ORAL_CAPSULE | Freq: Every day | ORAL | 3 refills | 90.00000 days | Status: CN
Start: 2022-10-21 — End: ?

## 2022-10-22 ENCOUNTER — Telehealth: Admit: 2022-10-22 | Discharge: 2022-10-23 | Payer: MEDICARE

## 2022-10-22 DIAGNOSIS — Z8669 Personal history of other diseases of the nervous system and sense organs: Principal | ICD-10-CM

## 2022-10-22 DIAGNOSIS — Z79899 Other long term (current) drug therapy: Principal | ICD-10-CM

## 2022-10-22 DIAGNOSIS — Z9149 Other personal history of psychological trauma, not elsewhere classified: Principal | ICD-10-CM

## 2022-10-22 DIAGNOSIS — F5105 Insomnia due to other mental disorder: Principal | ICD-10-CM

## 2022-10-22 MED ORDER — CLONAZEPAM 1 MG TABLET
1 refills | 0 days | Status: CP
Start: 2022-10-22 — End: ?

## 2022-10-30 DIAGNOSIS — G43109 Migraine with aura, not intractable, without status migrainosus: Principal | ICD-10-CM

## 2022-10-30 MED ORDER — QULIPTA 10 MG TABLET
0 refills | 0 days
Start: 2022-10-30 — End: ?

## 2022-10-30 MED ORDER — FREMANEZUMAB-VFRM 225 MG/1.5 ML SUBCUTANEOUS AUTO-INJECTOR
SUBCUTANEOUS | 5 refills | 0 days | Status: CP
Start: 2022-10-30 — End: ?

## 2022-10-31 DIAGNOSIS — G43109 Migraine with aura, not intractable, without status migrainosus: Principal | ICD-10-CM

## 2022-10-31 MED ORDER — QULIPTA 10 MG TABLET
0 refills | 0 days
Start: 2022-10-31 — End: ?

## 2022-11-01 MED ORDER — QULIPTA 10 MG TABLET
0 refills | 0 days
Start: 2022-11-01 — End: ?

## 2022-11-05 ENCOUNTER — Telehealth: Admit: 2022-11-05 | Discharge: 2022-11-06 | Payer: MEDICARE

## 2022-11-05 DIAGNOSIS — B029 Zoster without complications: Principal | ICD-10-CM

## 2022-11-05 MED ORDER — VALACYCLOVIR 1 GRAM TABLET
ORAL_TABLET | Freq: Three times a day (TID) | ORAL | 0 refills | 7 days | Status: CP
Start: 2022-11-05 — End: 2022-11-12

## 2022-11-18 DIAGNOSIS — D472 Monoclonal gammopathy: Principal | ICD-10-CM

## 2022-11-18 DIAGNOSIS — R5383 Other fatigue: Principal | ICD-10-CM

## 2022-11-18 DIAGNOSIS — I10 Essential (primary) hypertension: Principal | ICD-10-CM

## 2022-11-21 ENCOUNTER — Ambulatory Visit: Admit: 2022-11-21 | Discharge: 2022-11-22 | Payer: MEDICARE

## 2022-11-21 DIAGNOSIS — R5383 Other fatigue: Principal | ICD-10-CM

## 2022-11-21 DIAGNOSIS — I10 Essential (primary) hypertension: Principal | ICD-10-CM

## 2022-11-21 DIAGNOSIS — D472 Monoclonal gammopathy: Principal | ICD-10-CM

## 2022-11-21 DIAGNOSIS — M329 Systemic lupus erythematosus, unspecified: Principal | ICD-10-CM

## 2022-11-21 DIAGNOSIS — B379 Candidiasis, unspecified: Principal | ICD-10-CM

## 2022-11-21 MED ORDER — KETOCONAZOLE 2 % TOPICAL CREAM
Freq: Every day | TOPICAL | 1 refills | 60 days | Status: CP
Start: 2022-11-21 — End: 2023-11-21

## 2022-12-23 DIAGNOSIS — M797 Fibromyalgia: Principal | ICD-10-CM

## 2022-12-23 MED ORDER — TRAMADOL 50 MG TABLET
ORAL_TABLET | Freq: Three times a day (TID) | ORAL | 1 refills | 14 days | Status: CP | PRN
Start: 2022-12-23 — End: ?

## 2022-12-23 MED ORDER — GABAPENTIN 300 MG CAPSULE
ORAL_CAPSULE | 1 refills | 0 days | Status: CP
Start: 2022-12-23 — End: ?

## 2023-01-09 DIAGNOSIS — F5105 Insomnia due to other mental disorder: Principal | ICD-10-CM

## 2023-01-09 DIAGNOSIS — Z9149 Other personal history of psychological trauma, not elsewhere classified: Principal | ICD-10-CM

## 2023-01-09 DIAGNOSIS — Z79899 Other long term (current) drug therapy: Principal | ICD-10-CM

## 2023-01-09 DIAGNOSIS — F3341 Major depressive disorder, recurrent, in partial remission: Principal | ICD-10-CM

## 2023-01-09 DIAGNOSIS — G894 Chronic pain syndrome: Principal | ICD-10-CM

## 2023-01-09 DIAGNOSIS — M797 Fibromyalgia: Principal | ICD-10-CM

## 2023-01-09 MED ORDER — CLONAZEPAM 1 MG TABLET
ORAL_TABLET | 1 refills | 0 days | Status: CP
Start: 2023-01-09 — End: ?

## 2023-01-21 MED ORDER — LEVOCETIRIZINE 5 MG TABLET
ORAL_TABLET | Freq: Every day | ORAL | 2 refills | 90 days | Status: CP
Start: 2023-01-21 — End: ?

## 2023-02-06 DIAGNOSIS — M797 Fibromyalgia: Principal | ICD-10-CM

## 2023-02-06 MED ORDER — GABAPENTIN 300 MG CAPSULE
ORAL_CAPSULE | 11 refills | 0 days | Status: CP
Start: 2023-02-06 — End: ?

## 2023-04-01 DIAGNOSIS — M797 Fibromyalgia: Principal | ICD-10-CM

## 2023-04-01 MED ORDER — TRAMADOL 50 MG TABLET
ORAL_TABLET | Freq: Three times a day (TID) | ORAL | 1 refills | 14.00 days | Status: CP | PRN
Start: 2023-04-01 — End: ?

## 2023-05-01 DIAGNOSIS — M797 Fibromyalgia: Principal | ICD-10-CM

## 2023-05-01 DIAGNOSIS — M3219 Other organ or system involvement in systemic lupus erythematosus: Principal | ICD-10-CM

## 2023-05-01 MED ORDER — NAPROXEN 500 MG TABLET
ORAL_TABLET | Freq: Every evening | ORAL | 3 refills | 46.00 days | Status: CP | PRN
Start: 2023-05-01 — End: ?

## 2023-05-01 MED ORDER — TRAMADOL 50 MG TABLET
ORAL_TABLET | Freq: Three times a day (TID) | ORAL | 1 refills | 14.00 days | Status: CP | PRN
Start: 2023-05-01 — End: ?

## 2023-05-02 DIAGNOSIS — I10 Essential (primary) hypertension: Principal | ICD-10-CM

## 2023-05-02 DIAGNOSIS — E039 Hypothyroidism, unspecified: Principal | ICD-10-CM

## 2023-05-08 ENCOUNTER — Ambulatory Visit: Admit: 2023-05-08 | Discharge: 2023-05-09 | Payer: MEDICARE

## 2023-05-08 ENCOUNTER — Other Ambulatory Visit: Admit: 2023-05-08 | Discharge: 2023-05-09 | Payer: MEDICARE

## 2023-05-08 DIAGNOSIS — E039 Hypothyroidism, unspecified: Principal | ICD-10-CM

## 2023-05-08 DIAGNOSIS — D472 Monoclonal gammopathy: Principal | ICD-10-CM

## 2023-05-08 DIAGNOSIS — I10 Essential (primary) hypertension: Principal | ICD-10-CM

## 2023-05-08 DIAGNOSIS — M329 Systemic lupus erythematosus, unspecified: Principal | ICD-10-CM

## 2023-06-19 DIAGNOSIS — F5105 Insomnia due to other mental disorder: Principal | ICD-10-CM

## 2023-06-19 DIAGNOSIS — Z9149 Other personal history of psychological trauma, not elsewhere classified: Principal | ICD-10-CM

## 2023-06-19 DIAGNOSIS — Z79899 Other long term (current) drug therapy: Principal | ICD-10-CM

## 2023-06-19 MED ORDER — CLONAZEPAM 1 MG TABLET
ORAL_TABLET | 0 refills | 0 days | Status: CP
Start: 2023-06-19 — End: ?

## 2023-06-20 MED ORDER — CLONAZEPAM 1 MG TABLET
ORAL_TABLET | 0 refills | 0 days
Start: 2023-06-20 — End: ?

## 2023-06-24 MED ORDER — CLONAZEPAM 1 MG TABLET
ORAL_TABLET | 0 refills | 0 days | Status: CP
Start: 2023-06-24 — End: ?

## 2023-07-21 MED ORDER — DULOXETINE 60 MG CAPSULE,DELAYED RELEASE
ORAL_CAPSULE | Freq: Every day | ORAL | 3 refills | 90.00000 days | Status: CP
Start: 2023-07-21 — End: ?

## 2023-08-14 ENCOUNTER — Ambulatory Visit: Admit: 2023-08-14 | Discharge: 2023-08-15 | Payer: Medicare (Managed Care)

## 2023-08-14 DIAGNOSIS — J3089 Other allergic rhinitis: Principal | ICD-10-CM

## 2023-08-14 DIAGNOSIS — F5105 Insomnia due to other mental disorder: Principal | ICD-10-CM

## 2023-08-14 DIAGNOSIS — E039 Hypothyroidism, unspecified: Principal | ICD-10-CM

## 2023-08-14 DIAGNOSIS — G894 Chronic pain syndrome: Principal | ICD-10-CM

## 2023-08-14 DIAGNOSIS — G8929 Other chronic pain: Principal | ICD-10-CM

## 2023-08-14 DIAGNOSIS — M797 Fibromyalgia: Principal | ICD-10-CM

## 2023-08-14 DIAGNOSIS — Z0000 Encounter for general adult medical examination without abnormal findings: Principal | ICD-10-CM

## 2023-08-14 DIAGNOSIS — I10 Essential (primary) hypertension: Principal | ICD-10-CM

## 2023-08-14 DIAGNOSIS — Z1231 Encounter for screening mammogram for malignant neoplasm of breast: Principal | ICD-10-CM

## 2023-08-14 DIAGNOSIS — J301 Allergic rhinitis due to pollen: Principal | ICD-10-CM

## 2023-08-14 DIAGNOSIS — Z1211 Encounter for screening for malignant neoplasm of colon: Principal | ICD-10-CM

## 2023-08-14 DIAGNOSIS — M3219 Other organ or system involvement in systemic lupus erythematosus: Principal | ICD-10-CM

## 2023-08-14 DIAGNOSIS — Z91018 Allergy to other foods: Principal | ICD-10-CM

## 2023-08-14 DIAGNOSIS — H61001 Unspecified perichondritis of right external ear: Principal | ICD-10-CM

## 2023-08-14 DIAGNOSIS — M533 Sacrococcygeal disorders, not elsewhere classified: Principal | ICD-10-CM

## 2023-08-14 DIAGNOSIS — Z9149 Other personal history of psychological trauma, not elsewhere classified: Principal | ICD-10-CM

## 2023-08-14 DIAGNOSIS — Z780 Asymptomatic menopausal state: Principal | ICD-10-CM

## 2023-08-14 DIAGNOSIS — K219 Gastro-esophageal reflux disease without esophagitis: Principal | ICD-10-CM

## 2023-08-14 DIAGNOSIS — F3341 Major depressive disorder, recurrent, in partial remission: Principal | ICD-10-CM

## 2023-08-14 DIAGNOSIS — Z79899 Other long term (current) drug therapy: Principal | ICD-10-CM

## 2023-08-14 MED ORDER — GABAPENTIN 300 MG CAPSULE
ORAL_CAPSULE | Freq: Two times a day (BID) | ORAL | 3 refills | 90.00000 days | Status: CP
Start: 2023-08-14 — End: ?

## 2023-08-14 MED ORDER — AZELASTINE 137 MCG (0.1 %) NASAL SPRAY
Freq: Two times a day (BID) | NASAL | 11 refills | 55.00000 days | Status: CP
Start: 2023-08-14 — End: ?

## 2023-08-14 MED ORDER — TRAZODONE 50 MG TABLET
ORAL_TABLET | Freq: Every evening | ORAL | 3 refills | 90.00000 days | Status: CP
Start: 2023-08-14 — End: 2024-08-08

## 2023-08-14 MED ORDER — OMEPRAZOLE 40 MG CAPSULE,DELAYED RELEASE
ORAL_CAPSULE | Freq: Every day | ORAL | 2 refills | 90.00000 days | Status: CP
Start: 2023-08-14 — End: ?

## 2023-08-14 MED ORDER — HYDROCHLOROTHIAZIDE 12.5 MG TABLET
ORAL_TABLET | Freq: Every day | ORAL | 3 refills | 90.00000 days | Status: CP
Start: 2023-08-14 — End: 2024-08-13

## 2023-08-14 MED ORDER — DULOXETINE 60 MG CAPSULE,DELAYED RELEASE
ORAL_CAPSULE | Freq: Every day | ORAL | 3 refills | 90.00000 days | Status: CP
Start: 2023-08-14 — End: ?

## 2023-08-14 MED ORDER — CLONAZEPAM 1 MG TABLET
ORAL_TABLET | ORAL | 0 refills | 0.00000 days | Status: CP
Start: 2023-08-14 — End: ?

## 2023-08-14 MED ORDER — NAPROXEN 500 MG TABLET
ORAL_TABLET | Freq: Every evening | ORAL | 3 refills | 46.00000 days | Status: CP | PRN
Start: 2023-08-14 — End: ?

## 2023-08-14 MED ORDER — EPINEPHRINE 0.3 MG/0.3 ML INJECTION, AUTO-INJECTOR
INTRAMUSCULAR | 3 refills | 0.00000 days | Status: CP
Start: 2023-08-14 — End: ?

## 2023-08-14 MED ORDER — LEVOTHYROXINE 50 MCG TABLET
ORAL_TABLET | Freq: Every day | ORAL | 3 refills | 90.00000 days | Status: CP
Start: 2023-08-14 — End: ?

## 2023-08-14 MED ORDER — TRAMADOL 50 MG TABLET
ORAL_TABLET | Freq: Three times a day (TID) | ORAL | 1 refills | 14.00000 days | Status: CP | PRN
Start: 2023-08-14 — End: ?

## 2023-08-14 MED ORDER — GABAPENTIN 100 MG CAPSULE
ORAL_CAPSULE | Freq: Three times a day (TID) | ORAL | 3 refills | 90.00000 days | Status: CP | PRN
Start: 2023-08-14 — End: ?

## 2023-08-14 MED ORDER — LEVOCETIRIZINE 5 MG TABLET
ORAL_TABLET | Freq: Every day | ORAL | 3 refills | 90.00000 days | Status: CP
Start: 2023-08-14 — End: ?

## 2023-08-14 MED ORDER — TRIAMCINOLONE ACETONIDE 0.1 % TOPICAL OINTMENT
TOPICAL | 2 refills | 0.00000 days | Status: CP
Start: 2023-08-14 — End: ?

## 2023-09-08 DIAGNOSIS — H61001 Unspecified perichondritis of right external ear: Principal | ICD-10-CM

## 2023-09-08 MED ORDER — TRIAMCINOLONE ACETONIDE 0.1 % TOPICAL OINTMENT
1 refills | 0.00000 days
Start: 2023-09-08 — End: ?

## 2023-09-10 MED ORDER — TRIAMCINOLONE ACETONIDE 0.1 % TOPICAL OINTMENT
TOPICAL | 3 refills | 0.00000 days | Status: CP
Start: 2023-09-10 — End: ?

## 2023-09-12 DIAGNOSIS — K580 Irritable bowel syndrome with diarrhea: Principal | ICD-10-CM

## 2023-09-12 MED ORDER — LOPERAMIDE 2 MG TABLET
ORAL_TABLET | Freq: Four times a day (QID) | ORAL | 3 refills | 15.00000 days | Status: CP | PRN
Start: 2023-09-12 — End: ?

## 2023-09-12 MED ORDER — LINACLOTIDE 145 MCG CAPSULE
ORAL_CAPSULE | Freq: Every day | ORAL | 3 refills | 30.00000 days | Status: CP
Start: 2023-09-12 — End: ?

## 2023-10-23 DIAGNOSIS — M797 Fibromyalgia: Principal | ICD-10-CM

## 2023-10-23 DIAGNOSIS — F3342 Major depressive disorder, recurrent, in full remission: Principal | ICD-10-CM

## 2023-10-23 DIAGNOSIS — J019 Acute sinusitis, unspecified: Principal | ICD-10-CM

## 2023-10-23 DIAGNOSIS — B9689 Other specified bacterial agents as the cause of diseases classified elsewhere: Principal | ICD-10-CM

## 2023-10-23 DIAGNOSIS — Z9149 Other personal history of psychological trauma, not elsewhere classified: Principal | ICD-10-CM

## 2023-10-23 MED ORDER — DULOXETINE 20 MG CAPSULE,DELAYED RELEASE
ORAL_CAPSULE | ORAL | 0 refills | 28.00000 days | Status: CP
Start: 2023-10-23 — End: 2023-11-20

## 2023-10-23 MED ORDER — LEVOFLOXACIN 500 MG TABLET
ORAL_TABLET | Freq: Every day | ORAL | 0 refills | 7.00000 days | Status: CP
Start: 2023-10-23 — End: 2023-10-30

## 2023-10-23 MED ORDER — SERTRALINE 50 MG TABLET
ORAL_TABLET | ORAL | 2 refills | 0.00000 days | Status: CP
Start: 2023-10-23 — End: ?

## 2023-11-08 MED ORDER — TRAMADOL 50 MG TABLET
ORAL_TABLET | Freq: Three times a day (TID) | ORAL | 0 refills | 14.00000 days | Status: CP | PRN
Start: 2023-11-08 — End: 2023-12-08

## 2023-11-13 DIAGNOSIS — Z9149 Other personal history of psychological trauma, not elsewhere classified: Principal | ICD-10-CM

## 2023-11-13 DIAGNOSIS — F3342 Major depressive disorder, recurrent, in full remission: Principal | ICD-10-CM

## 2023-11-13 MED ORDER — DULOXETINE 20 MG CAPSULE,DELAYED RELEASE
ORAL_CAPSULE | 1 refills | 0.00000 days
Start: 2023-11-13 — End: ?

## 2023-11-17 DIAGNOSIS — Z9149 Other personal history of psychological trauma, not elsewhere classified: Principal | ICD-10-CM

## 2023-11-17 DIAGNOSIS — F3342 Major depressive disorder, recurrent, in full remission: Principal | ICD-10-CM

## 2023-11-17 MED ORDER — DULOXETINE 20 MG CAPSULE,DELAYED RELEASE
ORAL_CAPSULE | ORAL | 1 refills | 0.00000 days | Status: CP
Start: 2023-11-17 — End: ?

## 2023-11-17 MED ORDER — SERTRALINE 50 MG TABLET
ORAL_TABLET | 1 refills | 0.00000 days
Start: 2023-11-17 — End: ?

## 2023-11-20 MED ORDER — SERTRALINE 50 MG TABLET
ORAL_TABLET | ORAL | 1 refills | 0.00000 days | Status: CP
Start: 2023-11-20 — End: ?

## 2023-12-09 DIAGNOSIS — F411 Generalized anxiety disorder: Principal | ICD-10-CM

## 2023-12-09 DIAGNOSIS — M797 Fibromyalgia: Principal | ICD-10-CM

## 2023-12-09 DIAGNOSIS — F331 Major depressive disorder, recurrent, moderate: Principal | ICD-10-CM

## 2023-12-09 MED ORDER — SERTRALINE 100 MG TABLET
ORAL_TABLET | Freq: Every day | ORAL | 3 refills | 90.00000 days | Status: CP
Start: 2023-12-09 — End: 2024-12-08

## 2023-12-09 MED ORDER — TRAMADOL 50 MG TABLET
ORAL_TABLET | Freq: Three times a day (TID) | ORAL | 0 refills | 14.00000 days | Status: CP | PRN
Start: 2023-12-09 — End: 2024-01-18

## 2023-12-10 MED ORDER — CLONAZEPAM 1 MG TABLET
ORAL_TABLET | ORAL | 0 refills | 0.00000 days | Status: CP
Start: 2023-12-10 — End: ?

## 2023-12-11 MED ORDER — CLONAZEPAM 1 MG TABLET
ORAL_TABLET | ORAL | 0 refills | 0.00000 days | Status: CP
Start: 2023-12-11 — End: ?

## 2023-12-16 MED ORDER — KETOCONAZOLE 2 % TOPICAL CREAM
Freq: Every day | TOPICAL | 1 refills | 60.00000 days | Status: CP
Start: 2023-12-16 — End: 2024-04-14

## 2024-01-28 ENCOUNTER — Ambulatory Visit
Admit: 2024-01-28 | Discharge: 2024-01-29 | Payer: Medicare (Managed Care) | Attending: Internal Medicine | Primary: Internal Medicine

## 2024-01-28 DIAGNOSIS — M797 Fibromyalgia: Principal | ICD-10-CM

## 2024-01-28 DIAGNOSIS — C8590 Non-Hodgkin lymphoma, unspecified, unspecified site: Principal | ICD-10-CM

## 2024-01-28 DIAGNOSIS — D472 Monoclonal gammopathy: Principal | ICD-10-CM

## 2024-01-28 DIAGNOSIS — M329 Systemic lupus erythematosus, unspecified: Principal | ICD-10-CM

## 2024-01-28 DIAGNOSIS — E559 Vitamin D deficiency, unspecified: Principal | ICD-10-CM

## 2024-01-29 DIAGNOSIS — M797 Fibromyalgia: Principal | ICD-10-CM

## 2024-01-29 MED ORDER — TRAMADOL 50 MG TABLET
ORAL_TABLET | Freq: Three times a day (TID) | ORAL | 1 refills | 14.00000 days | Status: CP | PRN
Start: 2024-01-29 — End: 2024-03-09

## 2024-02-18 MED ORDER — PERFLUOROHEXYLOCTANE (PF) 100 % EYE DROPS
Freq: Four times a day (QID) | OPHTHALMIC | 0 refills | 0.00000 days | Status: CP
Start: 2024-02-18 — End: ?

## 2024-03-11 DIAGNOSIS — F5105 Insomnia due to other mental disorder: Principal | ICD-10-CM

## 2024-03-11 DIAGNOSIS — F411 Generalized anxiety disorder: Principal | ICD-10-CM

## 2024-03-11 DIAGNOSIS — Z9149 Other personal history of psychological trauma, not elsewhere classified: Principal | ICD-10-CM

## 2024-03-11 DIAGNOSIS — K219 Gastro-esophageal reflux disease without esophagitis: Principal | ICD-10-CM

## 2024-03-11 DIAGNOSIS — Z79899 Other long term (current) drug therapy: Principal | ICD-10-CM

## 2024-03-11 DIAGNOSIS — M797 Fibromyalgia: Principal | ICD-10-CM

## 2024-03-11 DIAGNOSIS — J0141 Acute recurrent pansinusitis: Principal | ICD-10-CM

## 2024-03-11 DIAGNOSIS — M3219 Other organ or system involvement in systemic lupus erythematosus: Principal | ICD-10-CM

## 2024-03-11 MED ORDER — NAPROXEN 500 MG TABLET
ORAL_TABLET | Freq: Every evening | ORAL | 3 refills | 46.00000 days | Status: CP | PRN
Start: 2024-03-11 — End: ?

## 2024-03-11 MED ORDER — CLONAZEPAM 1 MG TABLET
ORAL_TABLET | ORAL | 0 refills | 0.00000 days | Status: CP
Start: 2024-03-11 — End: ?

## 2024-03-11 MED ORDER — SERTRALINE 25 MG TABLET
ORAL_TABLET | Freq: Every day | ORAL | 0 refills | 30.00000 days | Status: CP
Start: 2024-03-11 — End: ?

## 2024-03-11 MED ORDER — OMEPRAZOLE 40 MG CAPSULE,DELAYED RELEASE
ORAL_CAPSULE | Freq: Every day | ORAL | 2 refills | 90.00000 days | Status: CP
Start: 2024-03-11 — End: ?

## 2024-03-11 MED ORDER — LEVOFLOXACIN 500 MG TABLET
ORAL_TABLET | ORAL | 0 refills | 0.00000 days | Status: CP
Start: 2024-03-11 — End: ?

## 2024-03-29 MED ORDER — TRAMADOL 50 MG TABLET
ORAL_TABLET | Freq: Every day | ORAL | 1 refills | 40.00000 days | Status: CP | PRN
Start: 2024-03-29 — End: ?

## 2024-04-03 DIAGNOSIS — F411 Generalized anxiety disorder: Principal | ICD-10-CM

## 2024-04-03 MED ORDER — SERTRALINE 25 MG TABLET
ORAL_TABLET | Freq: Every day | ORAL | 1 refills | 0.00000 days
Start: 2024-04-03 — End: ?

## 2024-04-06 MED ORDER — SERTRALINE 25 MG TABLET
ORAL_TABLET | Freq: Every day | ORAL | 1 refills | 90.00000 days
Start: 2024-04-06 — End: ?
